# Patient Record
Sex: Female | Born: 1935 | Race: White | Hispanic: No | Smoking: Former smoker
Health system: Southern US, Community
[De-identification: ages and names within clinical notes are randomized; demographics above are authoritative.]

## PROBLEM LIST (undated history)

## (undated) DIAGNOSIS — R519 Headache, unspecified: Secondary | ICD-10-CM

## (undated) DIAGNOSIS — C801 Malignant (primary) neoplasm, unspecified: Secondary | ICD-10-CM

## (undated) DIAGNOSIS — I639 Cerebral infarction, unspecified: Secondary | ICD-10-CM

## (undated) DIAGNOSIS — E854 Organ-limited amyloidosis: Secondary | ICD-10-CM

## (undated) DIAGNOSIS — Z1810 Retained metal fragments, unspecified: Secondary | ICD-10-CM

## (undated) DIAGNOSIS — I219 Acute myocardial infarction, unspecified: Secondary | ICD-10-CM

## (undated) DIAGNOSIS — K219 Gastro-esophageal reflux disease without esophagitis: Secondary | ICD-10-CM

## (undated) DIAGNOSIS — S060X9A Concussion with loss of consciousness of unspecified duration, initial encounter: Secondary | ICD-10-CM

## (undated) DIAGNOSIS — I2510 Atherosclerotic heart disease of native coronary artery without angina pectoris: Secondary | ICD-10-CM

## (undated) DIAGNOSIS — M1990 Unspecified osteoarthritis, unspecified site: Secondary | ICD-10-CM

## (undated) DIAGNOSIS — R51 Headache: Secondary | ICD-10-CM

## (undated) DIAGNOSIS — S8263XA Displaced fracture of lateral malleolus of unspecified fibula, initial encounter for closed fracture: Secondary | ICD-10-CM

## (undated) DIAGNOSIS — K449 Diaphragmatic hernia without obstruction or gangrene: Secondary | ICD-10-CM

## (undated) DIAGNOSIS — C4491 Basal cell carcinoma of skin, unspecified: Secondary | ICD-10-CM

## (undated) DIAGNOSIS — E7800 Pure hypercholesterolemia, unspecified: Secondary | ICD-10-CM

## (undated) DIAGNOSIS — I10 Essential (primary) hypertension: Secondary | ICD-10-CM

## (undated) DIAGNOSIS — I739 Peripheral vascular disease, unspecified: Secondary | ICD-10-CM

## (undated) DIAGNOSIS — M359 Systemic involvement of connective tissue, unspecified: Secondary | ICD-10-CM

## (undated) DIAGNOSIS — F419 Anxiety disorder, unspecified: Secondary | ICD-10-CM

## (undated) DIAGNOSIS — N12 Tubulo-interstitial nephritis, not specified as acute or chronic: Secondary | ICD-10-CM

## (undated) HISTORY — DX: Basal cell carcinoma of skin, unspecified: C44.91

## (undated) HISTORY — DX: Atherosclerotic heart disease of native coronary artery without angina pectoris: I25.10

## (undated) HISTORY — PX: COLONOSCOPY: SHX174

## (undated) HISTORY — PX: OTHER SURGICAL HISTORY: SHX169

## (undated) HISTORY — DX: Essential (primary) hypertension: I10

## (undated) HISTORY — DX: Diaphragmatic hernia without obstruction or gangrene: K44.9

## (undated) HISTORY — DX: Pure hypercholesterolemia, unspecified: E78.00

---
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
## 1977-08-12 HISTORY — PX: ABDOMINAL HYSTERECTOMY: SHX81

## 1993-08-12 HISTORY — PX: JOINT REPLACEMENT: SHX530

## 2002-01-27 ENCOUNTER — Encounter: Payer: Self-pay | Admitting: Neurosurgery

## 2002-01-27 ENCOUNTER — Ambulatory Visit (HOSPITAL_COMMUNITY): Admission: RE | Admit: 2002-01-27 | Discharge: 2002-01-27 | Payer: Self-pay | Admitting: Neurosurgery

## 2003-08-13 DIAGNOSIS — I219 Acute myocardial infarction, unspecified: Secondary | ICD-10-CM

## 2003-08-13 HISTORY — DX: Acute myocardial infarction, unspecified: I21.9

## 2004-08-31 ENCOUNTER — Inpatient Hospital Stay: Payer: Self-pay | Admitting: Cardiology

## 2004-10-08 ENCOUNTER — Ambulatory Visit: Payer: Self-pay | Admitting: Internal Medicine

## 2006-05-06 ENCOUNTER — Ambulatory Visit (HOSPITAL_COMMUNITY): Admission: RE | Admit: 2006-05-06 | Discharge: 2006-05-07 | Payer: Self-pay | Admitting: *Deleted

## 2008-04-27 ENCOUNTER — Ambulatory Visit: Payer: Self-pay | Admitting: Obstetrics and Gynecology

## 2008-05-19 ENCOUNTER — Ambulatory Visit: Payer: Self-pay | Admitting: Obstetrics and Gynecology

## 2008-05-27 ENCOUNTER — Inpatient Hospital Stay: Payer: Self-pay | Admitting: Obstetrics and Gynecology

## 2008-08-12 HISTORY — PX: EYE SURGERY: SHX253

## 2012-08-12 HISTORY — PX: CAROTID STENT: SHX1301

## 2012-09-28 LAB — CBC AND DIFFERENTIAL: WBC: 6.1 10^3/mL

## 2012-09-30 ENCOUNTER — Ambulatory Visit: Payer: Self-pay | Admitting: Internal Medicine

## 2012-10-01 LAB — BASIC METABOLIC PANEL
Calcium, Total: 8.8 mg/dL (ref 8.5–10.1)
Chloride: 109 mmol/L — ABNORMAL HIGH (ref 98–107)
Co2: 23 mmol/L (ref 21–32)
Potassium: 4.3 mmol/L (ref 3.5–5.1)

## 2012-12-12 ENCOUNTER — Encounter: Payer: Self-pay | Admitting: Internal Medicine

## 2012-12-14 ENCOUNTER — Ambulatory Visit: Payer: Self-pay | Admitting: Internal Medicine

## 2013-01-21 ENCOUNTER — Encounter: Payer: Self-pay | Admitting: Internal Medicine

## 2014-07-05 DIAGNOSIS — I471 Supraventricular tachycardia: Secondary | ICD-10-CM | POA: Insufficient documentation

## 2014-08-12 DIAGNOSIS — I639 Cerebral infarction, unspecified: Secondary | ICD-10-CM

## 2014-08-12 HISTORY — DX: Cerebral infarction, unspecified: I63.9

## 2014-08-24 ENCOUNTER — Ambulatory Visit (INDEPENDENT_AMBULATORY_CARE_PROVIDER_SITE_OTHER): Payer: Medicare Other | Admitting: Podiatry

## 2014-08-24 ENCOUNTER — Encounter: Payer: Self-pay | Admitting: Podiatry

## 2014-08-24 VITALS — BP 135/63 | HR 76 | Resp 16 | Ht 65.0 in | Wt 157.0 lb

## 2014-08-24 DIAGNOSIS — M7741 Metatarsalgia, right foot: Secondary | ICD-10-CM
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
## 2014-10-03 ENCOUNTER — Ambulatory Visit (INDEPENDENT_AMBULATORY_CARE_PROVIDER_SITE_OTHER): Payer: Medicare Other | Admitting: *Deleted

## 2014-10-03 DIAGNOSIS — M7741 Metatarsalgia, right foot: Secondary | ICD-10-CM

## 2014-10-03 NOTE — Progress Notes (Signed)
Orthotics dispensed. Instructions given on break in.

## 2014-10-03 NOTE — Patient Instructions (Signed)

## 2014-10-21 DIAGNOSIS — I6523 Occlusion and stenosis of bilateral carotid arteries: Secondary | ICD-10-CM | POA: Insufficient documentation

## 2014-10-21 DIAGNOSIS — J3089 Other allergic rhinitis: Secondary | ICD-10-CM | POA: Insufficient documentation

## 2014-10-31 ENCOUNTER — Ambulatory Visit: Payer: Medicare Other | Admitting: Podiatry

## 2014-12-02 NOTE — Discharge Summary (Signed)
PATIENT NAME:  Kelly Ritter, Kelly Ritter MR#:  [id number] DATE OF BIRTH:  12-28-1933  DATE OF ADMISSION:  09/30/2012 DATE OF DISCHARGE:  10/01/2012  HISTORY: This is a 81 year old female with coronary artery disease, hypertension, hyperlipidemia and chest pain.  DISCHARGE DIAGNOSES: 1.  Coronary artery disease.  2.  Hypertension.  3.  Hyperlipidemia.  The patient has had progressive episodes of chest discomfort, shortness of breath and palpitations consistent with her anginal equivalent. She underwent cardiac catheterization showing a patent left circumflex and left anterior descending artery stent with a restenosis of her right coronary artery stent. She underwent drug-eluting stent implant without evidence of significant complications. She had reached her maximal hospital benefit and was discharged to home. The patient has had no further symptoms.  DISCHARGE MEDICATIONS: Aspirin 325 mg p.o. daily, Plavix 75 mg p.o. daily, Bystolic 5 mg each day, Lipitor 10 mg each day and lisinopril 10 mg each night.   FOLLOWUP: She is to follow up in two weeks for further adjustments of medications.    ____________________________ Corey Skains, MD bjk:aw D: 10/01/2012 08:07:15 ET T: 10/01/2012 08:34:15 ET JOB#: [id number]  cc: Corey Skains, MD, <Dictator> Corey Skains MD ELECTRONICALLY SIGNED 10/09/2012 8:54

## 2014-12-12 ENCOUNTER — Inpatient Hospital Stay
Admission: EM | Admit: 2014-12-12 | Discharge: 2014-12-13 | DRG: 069 | Disposition: A | Discharge status: Home / Self Care | Payer: Medicare Other | Attending: Internal Medicine | Admitting: Internal Medicine

## 2014-12-12 ENCOUNTER — Emergency Department: Payer: Medicare Other

## 2014-12-12 ENCOUNTER — Inpatient Hospital Stay: Payer: Medicare Other

## 2014-12-12 ENCOUNTER — Encounter: Payer: Self-pay | Admitting: Emergency Medicine

## 2014-12-12 DIAGNOSIS — Z7982 Long term (current) use of aspirin: Secondary | ICD-10-CM | POA: Diagnosis not present

## 2014-12-12 DIAGNOSIS — Z7902 Long term (current) use of antithrombotics/antiplatelets: Secondary | ICD-10-CM

## 2014-12-12 DIAGNOSIS — H532 Diplopia: Secondary | ICD-10-CM | POA: Diagnosis present

## 2014-12-12 DIAGNOSIS — I6523 Occlusion and stenosis of bilateral carotid arteries: Secondary | ICD-10-CM | POA: Diagnosis present

## 2014-12-12 DIAGNOSIS — E780 Pure hypercholesterolemia: Secondary | ICD-10-CM | POA: Diagnosis present

## 2014-12-12 DIAGNOSIS — I10 Essential (primary) hypertension: Secondary | ICD-10-CM | POA: Diagnosis present

## 2014-12-12 DIAGNOSIS — Z955 Presence of coronary angioplasty implant and graft: Secondary | ICD-10-CM

## 2014-12-12 DIAGNOSIS — I639 Cerebral infarction, unspecified: Secondary | ICD-10-CM | POA: Diagnosis present

## 2014-12-12 DIAGNOSIS — R4701 Aphasia: Secondary | ICD-10-CM | POA: Diagnosis present

## 2014-12-12 DIAGNOSIS — G459 Transient cerebral ischemic attack, unspecified: Secondary | ICD-10-CM

## 2014-12-12 DIAGNOSIS — Z8249 Family history of ischemic heart disease and other diseases of the circulatory system: Secondary | ICD-10-CM | POA: Diagnosis not present

## 2014-12-12 DIAGNOSIS — I2510 Atherosclerotic heart disease of native coronary artery without angina pectoris: Secondary | ICD-10-CM | POA: Diagnosis present

## 2014-12-12 DIAGNOSIS — I6529 Occlusion and stenosis of unspecified carotid artery: Secondary | ICD-10-CM

## 2014-12-12 DIAGNOSIS — E785 Hyperlipidemia, unspecified: Secondary | ICD-10-CM | POA: Diagnosis present

## 2014-12-12 DIAGNOSIS — R29810 Facial weakness: Secondary | ICD-10-CM | POA: Diagnosis present

## 2014-12-12 DIAGNOSIS — Z87891 Personal history of nicotine dependence: Secondary | ICD-10-CM | POA: Diagnosis not present

## 2014-12-12 DIAGNOSIS — Z96659 Presence of unspecified artificial knee joint: Secondary | ICD-10-CM | POA: Diagnosis present

## 2014-12-12 DIAGNOSIS — Z79899 Other long term (current) drug therapy: Secondary | ICD-10-CM

## 2014-12-12 HISTORY — DX: Systemic involvement of connective tissue, unspecified: M35.9

## 2014-12-12 LAB — URINALYSIS COMPLETE WITH MICROSCOPIC (ARMC ONLY)
Bacteria, UA: NONE SEEN
Bilirubin Urine: NEGATIVE
Glucose, UA: NEGATIVE mg/dL
Ketones, ur: NEGATIVE mg/dL
LEUKOCYTES UA: NEGATIVE
Nitrite: NEGATIVE
PH: 5 (ref 5.0–8.0)
PROTEIN: NEGATIVE mg/dL
Specific Gravity, Urine: 1.006 (ref 1.005–1.030)

## 2014-12-12 LAB — CBC WITH DIFFERENTIAL/PLATELET
Basophils Absolute: 0.1 10*3/uL (ref 0–0.1)
Basophils Relative: 1 %
Eosinophils Absolute: 0.1 10*3/uL (ref 0–0.7)
Eosinophils Relative: 2 %
HCT: 43.1 % (ref 35.0–47.0)
HEMOGLOBIN: 14.2 g/dL (ref 12.0–16.0)
Lymphocytes Relative: 22 %
Lymphs Abs: 1.2 10*3/uL (ref 1.0–3.6)
MCH: 31.1 pg (ref 26.0–34.0)
MCHC: 32.9 g/dL (ref 32.0–36.0)
MCV: 94.7 fL (ref 80.0–100.0)
MONO ABS: 0.4 10*3/uL (ref 0.2–0.9)
Neutro Abs: 3.6 10*3/uL (ref 1.4–6.5)
Neutrophils Relative %: 68 %
PLATELETS: 177 10*3/uL (ref 150–440)
RBC: 4.55 MIL/uL (ref 3.80–5.20)
RDW: 12.8 % (ref 11.5–14.5)
WBC: 5.3 10*3/uL (ref 3.6–11.0)

## 2014-12-12 LAB — COMPREHENSIVE METABOLIC PANEL
ALT: 17 U/L (ref 14–54)
AST: 20 U/L (ref 15–41)
Albumin: 4.5 g/dL (ref 3.5–5.0)
Alkaline Phosphatase: 87 U/L (ref 38–126)
Anion gap: 9 (ref 5–15)
BUN: 17 mg/dL (ref 6–20)
CHLORIDE: 106 mmol/L (ref 101–111)
CO2: 27 mmol/L (ref 22–32)
Calcium: 9.3 mg/dL (ref 8.9–10.3)
Creatinine, Ser: 0.77 mg/dL (ref 0.44–1.00)
GFR calc non Af Amer: >60 mL/min (ref >60)
Glucose, Bld: 111 mg/dL — ABNORMAL HIGH (ref 65–99)
Potassium: 4 mmol/L (ref 3.5–5.1)
Sodium: 142 mmol/L (ref 135–145)
TOTAL PROTEIN: 7.5 g/dL (ref 6.5–8.1)
Total Bilirubin: 0.8 mg/dL (ref 0.3–1.2)

## 2014-12-12 LAB — URINE DRUG SCREEN, QUALITATIVE (ARMC ONLY)
AMPHETAMINES, UR SCREEN: NOT DETECTED
BENZODIAZEPINE, UR SCRN: NOT DETECTED
Barbiturates, Ur Screen: NOT DETECTED
COCAINE METABOLITE, UR ~~LOC~~: NOT DETECTED
Cannabinoid 50 Ng, Ur ~~LOC~~: NOT DETECTED
MDMA (Ecstasy)Ur Screen: NOT DETECTED
Methadone Scn, Ur: NOT DETECTED
OPIATE, UR SCREEN: NOT DETECTED
Phencyclidine (PCP) Ur S: NOT DETECTED
TRICYCLIC, UR SCREEN: NOT DETECTED

## 2014-12-12 LAB — APTT: APTT: 29 s (ref 24–36)

## 2014-12-12 LAB — PROTIME-INR
INR: 0.99
Prothrombin Time: 13.3 seconds (ref 11.4–15.0)

## 2014-12-12 LAB — TROPONIN I: Troponin I: <0.03 ng/mL (ref <0.031)

## 2014-12-12 MED ORDER — ROSUVASTATIN CALCIUM 20 MG PO TABS
20.0000 mg | ORAL_TABLET | Freq: Every day | ORAL | Status: DC
  Administered 2014-12-12: 20 mg via ORAL
  Filled 2014-12-12: qty 1

## 2014-12-12 MED ORDER — ASPIRIN 81 MG PO TABS
81.0000 mg | ORAL_TABLET | Freq: Every day | ORAL | Status: DC
  Administered 2014-12-12: 81 mg via ORAL
  Filled 2014-12-12 (×3): qty 1

## 2014-12-12 MED ORDER — ACETAMINOPHEN 650 MG RE SUPP: 650.0000 mg | Freq: Four times a day (QID) | RECTAL | Status: DC | PRN

## 2014-12-12 MED ORDER — ACETAMINOPHEN 325 MG PO TABS
650.0000 mg | ORAL_TABLET | Freq: Four times a day (QID) | ORAL | Status: DC | PRN
  Administered 2014-12-12: 650 mg via ORAL
  Filled 2014-12-12: qty 2

## 2014-12-12 MED ORDER — ASPIRIN 81 MG PO CHEW
CHEWABLE_TABLET | ORAL | Status: AC
  Administered 2014-12-12: 324 mg via ORAL
  Filled 2014-12-12: qty 4

## 2014-12-12 MED ORDER — SENNOSIDES-DOCUSATE SODIUM 8.6-50 MG PO TABS: 1.0000 | ORAL_TABLET | Freq: Every evening | ORAL | Status: DC | PRN

## 2014-12-12 MED ORDER — ENOXAPARIN SODIUM 40 MG/0.4ML ~~LOC~~ SOLN
SUBCUTANEOUS | Status: AC
  Administered 2014-12-12: 40 mg via SUBCUTANEOUS
  Filled 2014-12-12: qty 0.4

## 2014-12-12 MED ORDER — ENOXAPARIN SODIUM 40 MG/0.4ML ~~LOC~~ SOLN
40.0000 mg | SUBCUTANEOUS | Status: DC
  Administered 2014-12-12 – 2014-12-13 (×2): 40 mg via SUBCUTANEOUS
  Filled 2014-12-12: qty 0.4

## 2014-12-12 MED ORDER — ASPIRIN 81 MG PO CHEW
324.0000 mg | CHEWABLE_TABLET | Freq: Once | ORAL | Status: AC
  Administered 2014-12-12: 324 mg via ORAL

## 2014-12-12 MED ORDER — CLOPIDOGREL BISULFATE 75 MG PO TABS
75.0000 mg | ORAL_TABLET | Freq: Every day | ORAL | Status: DC
  Administered 2014-12-12: 75 mg via ORAL
  Filled 2014-12-12: qty 1

## 2014-12-12 MED ORDER — IOHEXOL 350 MG/ML SOLN
100.0000 mL | Freq: Once | INTRAVENOUS | Status: AC | PRN
  Administered 2014-12-12: 100 mL via INTRAVENOUS

## 2014-12-12 MED ORDER — ASPIRIN EC 81 MG PO TBEC
81.0000 mg | DELAYED_RELEASE_TABLET | Freq: Every day | ORAL | Status: DC
  Administered 2014-12-13: 81 mg via ORAL
  Filled 2014-12-12: qty 1

## 2014-12-12 NOTE — H&P (Addendum)
Patient Demographics  Kelly Ritter, is a 81 y.o. female  MRN: [id number]   DOB - 01-17-1934  Admit Date - 12/12/2014  Outpatient Primary MD for the patient is Singh,Jasmine, MD   With History of -  Past Medical History  Diagnosis Date  . Hypertension   . Hiatal hernia   . CAD (coronary artery disease)     s/p PCI and stent placement of circumflex and LAD and RCA.  restenosis of RCA 2014 with drug eluting stent  . Hypercholesterolemia       Past Surgical History  Procedure Laterality Date  . Joint replacement      knee replacement    in for   Chief Complaint  Patient presents with  . Aphasia  . Diplopia     HPI  Kelly Ritter  is a 81 y.o. female with past medical history as stated above who presents this morning to the emergency department with complaints of blurred vision. Patient says she's had double vision and blurred vision since this morning around 6:30 she woke up with a slight headache and slurred speech and noted her left side vision to be double. She says she seeds optic side to side. Her symptoms resolved by the time she got to the ER at 8:30 AM this morning. She received an aspirin in the emergency department and also services consult to to admit the patient. Patient underwent a carotid Doppler approximately 2 weeks ago. The Doppler had 70% stenosis in the left carotid artery. She saw Dr. do for this lesion. She says at this time Dr. do did not want to do any intervention.    Review of Systems     Constitutional: Negative for fever, chills and malaise/fatigue.  HENT: Negative for ear pain, nosebleeds and sore throat.   Eyes: Negative for blurred vision, double vision and pain.  Respiratory: Negative for cough, hemoptysis, shortness of breath and wheezing.   Cardiovascular: Negative for  chest pain, palpitations, orthopnea and PND.  Gastrointestinal: Negative for nausea, vomiting, abdominal pain, diarrhea, constipation and blood in stool.  Genitourinary: Negative for dysuria, urgency and hematuria.  Musculoskeletal: Negative for myalgias and back pain.  Skin: Negative for rash.  Neurological: Negative for dizziness, tremors, speech change, focal weakness, seizures and headaches.  Endo/Heme/Allergies: Negative for polydipsia. Does not bruise/bleed easily.  Psychiatric/Behavioral: Negative for depression and hallucinations. The patient is not nervous/anxious.    Social History History  Substance Use Topics  . Smoking status: Former Smoker -- 1.00 packs/day for 8 years    Quit date: 08/12/1989  . Smokeless tobacco: Never Used  . Alcohol Use: 0.0 oz/week    0 Standard drinks or equivalent per week     Comment: rarely drinks alcohol        Family History Family History  Problem Relation Age of Onset  . Heart disease Mother   . Hypertension Brother   . Heart disease Brother 104  several MIs  . Heart disease Daughter 84    deceased from MI     Prior to Admission medications   Medication Sig Start Date End Date Taking? Authorizing Provider  acetaminophen (TYLENOL) 650 MG CR tablet Take 650 mg by mouth at bedtime as needed for pain.    Yes Historical Provider, MD  aspirin 81 MG tablet Take 81 mg by mouth daily.   Yes Historical Provider, MD  clopidogrel (PLAVIX) 75 MG tablet Take 75 mg by mouth daily.   Yes Historical Provider, MD  lisinopril (PRINIVIL,ZESTRIL) 10 MG tablet Take 10 mg by mouth daily.   Yes Historical Provider, MD  nebivolol (BYSTOLIC) 5 MG tablet Take 5 mg by mouth daily.   Yes Historical Provider, MD  potassium gluconate 595 MG TABS tablet Take 595 mg by mouth at bedtime as needed (leg cramps).   Yes Historical Provider, MD  rosuvastatin (CRESTOR) 20 MG tablet Take 20 mg by mouth at bedtime.   Yes Historical Provider, MD  rosuvastatin (CRESTOR)  10 MG tablet Take 10 mg by mouth daily.    Historical Provider, MD    Allergies  Allergen Reactions  . Sulfa Antibiotics   . Latex Rash    Physical Exam  Vitals  Blood pressure 137/54, pulse 59, temperature 97.5 F (36.4 C), temperature source Oral, resp. rate 14, height 5\' 4"  (1.626 m), weight 72.122 kg (159 lb), SpO2 100 %.   Constitutional:  Well-developed and well-nourished. No distress.  HENT:  Head: Normocephalic and atraumatic.  Mouth/Throat: Oropharynx is clear and moist.  Eyes: Pupils are equal, round, and reactive to light.  Neck: Normal range of motion. Neck supple. No JVD present. No tracheal deviation present. No thyromegaly present.  Cardiovascular: Normal rate, regular rhythm and normal heart sounds.  Exam reveals no gallop.   No murmur heard. Respiratory: Effort normal and breath sounds normal. No respiratory distress. No wheezing, crackles, rales. GI: Soft. No distension. There is no tenderness. There is no rebound and no guarding.  Musculoskeletal:No edema or tenderness.  Neurological: Alert and oriented to person, place, and time. She has mild left facial droop. She also has decreased sensation of left face. Coordination normal.  Skin: Skin is warm and dry. No rash noted.  Psychiatric: He has a normal mood and affect.     Data Review  CBC  Recent Labs Lab 12/12/14 0925  WBC 5.3  HGB 14.2  HCT 43.1  PLT 177  MCV 94.7  MCH 31.1  MCHC 32.9  RDW 12.8  LYMPHSABS 1.2  MONOABS 0.4  EOSABS 0.1  BASOSABS 0.1   ------------------------------------------------------------------------------------------------------------------  Chemistries   Recent Labs Lab 12/12/14 0925  NA 142  K 4.0  CL 106  CO2 27  GLUCOSE 111*  BUN 17  CREATININE 0.77  CALCIUM 9.3  AST 20  ALT 17  ALKPHOS 87  BILITOT 0.8   ------------------------------------------------------------------------------------------------------------------ estimated creatinine  clearance is 56.5 mL/min (by C-G formula based on Cr of 0.77). ------------------------------------------------------------------------------------------------------------------ No results for input(s): TSH, T4TOTAL, T3FREE, THYROIDAB in the last 72 hours.  Invalid input(s): FREET3   Coagulation profile  Recent Labs Lab 12/12/14 0925  INR 0.99   ------------------------------------------------------------------------------------------------------------------- No results for input(s): DDIMER in the last 72 hours. -------------------------------------------------------------------------------------------------------------------  Cardiac Enzymes  Recent Labs Lab 12/12/14 0925  TROPONINI <0.03   ------------------------------------------------------------------------------------------------------------------ Invalid input(s): POCBNP   ---------------------------------------------------------------------------------------------------------------  Urinalysis    Component Value Date/Time   COLORURINE STRAW* 12/12/2014 0950   APPEARANCEUR CLEAR* 12/12/2014 0950   LABSPEC 1.006  12/12/2014 0950   PHURINE 5.0 12/12/2014 0950   GLUCOSEU NEGATIVE 12/12/2014 0950   HGBUR 1+* 12/12/2014 0950   BILIRUBINUR NEGATIVE 12/12/2014 0950   KETONESUR NEGATIVE 12/12/2014 0950   PROTEINUR NEGATIVE 12/12/2014 0950   NITRITE NEGATIVE 12/12/2014 0950   LEUKOCYTESUR NEGATIVE 12/12/2014 0950    ----------------------------------------------------------------------------------------------------------------  Imaging results:   @[email or web address] @  My personal review of EKG: NSR,  no Acute ST changes    Assessment & Plan  1. CVA: Patient presents with diplopia of the left eye and slurred speech very concerning for a stroke. She also has a left facial droop. She had recent carotid Doppler showing 70% lesion and left carotid artery. She is currently on aspirin and Plavix which we will continue. She is  also on a statin which I will continue. I have asked Dr. do to see the patient in consultation patient will likely at this time need a CEA. We will hold hypertensive medications to allow perfusion. She is unable to obtain an MRI as she has metal in her eyes. We'll obtain CT of the head tomorrow. I've also ordered a 2-D echocardiogram with bubble study to evaluate for an embolic stroke. We'll continue neuro checks every 4 hours. She does not need physical therapy speech or occupational therapy at this time. 2. History of CAD status post 7 stents patient is seen by Dr. Jose Persia. We'll continue with her outpatient medications we are holding hypertensive medications at this time to allow brain perfusion due to problem #1. 3. Essential hypertension: We are holding medications as mentioned above. 4. Hyperlipidemia: We will check fasting lipids. Into any statin for now.   DVT Prophylaxis Heparin -  Lovenox - SCDs     Family Communication: Admission, patients condition and plan of care including tests being ordered have been discussed with the patient and family who indicate understanding and agree with the plan and Code Status.  Code Status full   Time spent in minutes : 35 minutes   Arlander Gillen, MD

## 2014-12-12 NOTE — Consult Note (Signed)
Kelly Ritter, Kelly Ritter               ACCOUNT NO.:  [id number]  MEDICAL RECORD NO.:  [id number]  LOCATION:  237A                         FACILITY:  [facility]  PHYSICIAN:  Algernon Huxley, MD        DATE OF BIRTH:  [birth year]  DATE OF CONSULTATION:  12/12/2014 DATE OF DISCHARGE:                                CONSULTATION   REASON FOR CONSULTATION:  Slurred speech, visual symptoms, and facial droop in a patient with known carotid artery stenosis.  HPI:  This is a 81 year old female who presented with a history of acute double and blurred vision in the left eye.  It started this morning. She had no previous symptoms or problems prior to this episode.  This started around 6:30 or 7 this morning.  Her symptoms had basically resolved by the time she got to the ER at 8:30.  She is on aspirin and Plavix chronically for known moderate left carotid artery stenosis.  She was last seen for this about a month ago.  At that time she was having no focal neurologic symptoms.  Associated with her visual symptoms was some possible facial droop, as well as some difficulty with speech that lasted only a few minutes.  She reports no trauma or injury.  She reports no fever or chills.  She has no previous history of stroke or TIA.  She does have a strong cardiac history.  REVIEW OF SYSTEMS:  GENERAL:  No fever or chills.  No unintentional weight loss or gain.  EYES:  As per HPI.  Positive for double and blurry vision.  EARS:  No ear pain or tinnitus.  CARDIAC:  History of significant coronary disease, and 7 previous coronary interventions, but no recent chest pain or palpitations.  RESPIRATORY:  No shortness of breath, cough, or wheeze.  GI:  No nausea, vomiting, diarrhea, or constipation.  GU:  No dysuria or hematuria.  MUSCULOSKELETAL:  No joint pain or swelling.  SKIN:  No new rashes or ulcers.  NEURO:  As per HPI. No history of seizures or tremors.  ENDOCRINE:  No heat or cold intolerance.  No polydipsia or  polyuria.  PSYCH:  No anxiety or depression.  No suicidal ideation.  HEME:  No anemia or easy bruising.  SOCIAL HISTORY:  Former tobacco use, but quit over 20 years ago.  No alcohol use.  Married and lives at home with her husband.  FAMILY HISTORY:  Has multiple members with myocardial infarction, including a daughter, brother, and mother.  Brother also has severe hypertension.  HOME MEDICATIONS: 1. Tylenol as needed for pain. 2. Aspirin 81 mg daily. 3. Plavix 75 mg daily. 4. Lisinopril 10 mg daily. 5. Bystolic 5 mg daily. 6. Potassium gluconate 595 mcg daily. 7. Crestor 20 mg daily.  ALLERGIES: 1. SULFA ANTIBIOTICS. 2. LATEX.  PHYSICAL EXAMINATION:  VITAL SIGNS:  She is afebrile, with a T max of 97.7.  Pulse is 59, respirations are 20, blood pressure is 142/61, saturation is 100% on room air. GENERAL:  She is a well-developed, well-nourished white female who appears slightly younger than her stated age and not in apparent distress. EYES:  Pupils are equal, round, and  reactive to light.  Sclerae are not icteric. HEAD:  Normocephalic and atraumatic. NECK:  Supple, without adenopathy or JVD.  A left carotid bruit was present. CARDIAC:  Regular rate and rhythm, without murmurs, rubs, or gallops. RESPIRATORY:  Good respiratory effort, without use of accessory muscles. Respirations are unlabored. GI:  Soft, nondistended, nontender. MUSCULOSKELETAL:  No cyanosis, clubbing, or edema. PSYCH:  Normal affect and mood.  Awake, alert, and oriented. NEURO:  Normal strength and tone in all 4 extremities.  No obvious facial droop or cranial nerve deficits at this time. SKIN:  Warm and dry, without rashes or ulcers.  STUDIES:  EKG showed normal sinus rhythm, with no ST changes. Laboratory evaluation revealed a white blood cell count of 5.3, hemoglobin 14.2, platelet count 177,000.  Sodium is 142, potassium 4.0, chloride 106, CO2 27, BUN 17, creatinine 0.77.  Troponin is  negative. Glucose 111.  CT of the head showed no acute intracranial issues.  ASSESSMENT/PLAN: 1. Transient ischemic attack. 2. Known mild right carotid stenosis and previous moderate left     carotid artery stenosis.  This is possibly causing number 1. 3. Coronary artery disease, with multiple previous coronary     interventions.  No current myocardial infarction or ischemia     appears present.  Discussed that cardiac issues can be causes of     transient ischemic attack or stroke as well. 4. Essential hypertension.  Primary service is holding medication.     Stable currently. 5. Hyperlipidemia, on statin.  Fasting lipid panel pending.  PLAN:  Given her known at least moderate left carotid artery stenosis and recent symptoms worrisome for ipsilateral embolization, I have recommended a CT angiogram for a more thorough evaluation.  This will allow US imaging to determine if her degree of stenosis is, indeed, greater than 60% to 70%.  If it is, consideration for treatment will be given, based on the likely symptomatic status from her recent TIA.  I have discussed the 2 modes of treatment, including carotid endarterectomy and carotid artery stenting.  She is on appropriate medical management with aspirin, Plavix, and a statin agent.  We will plan to get her CT angiogram done tonight, and I will review this and discuss with her the options.  If she does need a carotid endarterectomy, her Plavix will have to be stopped at least 3-5 days prior to surgery, and she should likely have a cardiac evaluation to assess her for perioperative risk.  If she is deemed prohibitive perioperative risk, with her extensive cardiac history, carotid artery stenting may be a possibility, but we will need to assess this with CT scan to evaluate her anatomy.  I have had a long discussion with her and her daughter-in-law today regarding her options, and she is agreeable with our plan of care.  This is a level 4  consultation.         ______________________________ Algernon Huxley, MD    JSD/MEDQ  D:  12/12/2014  T:  12/12/2014  Job:  [id number]

## 2014-12-12 NOTE — Progress Notes (Signed)
   12/12/14 1425  Clinical Encounter Type  Visited With Patient  Visit Type Spiritual support  Consult/Referral To Chaplain  Recommendations Spoke with nurse consult was included in error for AD.  Spiritual Encounters  Spiritual Needs Prayer  Stress Factors  Patient Stress Factors Health changes  Family Stress Factors None identified  Advance Directives (For Healthcare)  Does patient have an advance directive? No  Would patient like information on creating an advanced directive? No - patient declined information  Nurse requested AD information in error. Patient asked me to keep her in prayer as she is having an upcoming procedure.  Chaplain Candance Bohlman A. Dustyn Armbrister [phone]

## 2014-12-12 NOTE — Consult Note (Signed)
See dictated note. Unable to put standard consult note in computer CTA ordered

## 2014-12-12 NOTE — ED Provider Notes (Signed)
[facility] Emergency Department Provider Note    ____________________________________________  Time seen: 9 AM  I have reviewed the triage vital signs and the nursing notes.   HISTORY  Chief Complaint Aphasia and Diplopia     HPI Kelly Ritter is a 81 y.o. female who presents with slurred speech and double vision. Symptoms improved prior to arrival. Patient reports that she awoke this morning feeling funny and foggy. After she went downstairs she noticed that she had double vision when looking at her husband. Her husband reports that her speech seemed slightly slurred. Patient denies headache. She has no history of CVA. 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 Saw Dr. Lucky Cowboy for this and medication management was recommended. No fevers chills. No nausea vomiting. No motor weakness or numbness.     Past Medical History  Diagnosis Date  . Hypertension   . Hiatal hernia   . CAD (coronary artery disease)     s/p PCI and stent placement of circumflex and LAD and RCA.  restenosis of RCA 2014 with drug eluting stent  . Hypercholesterolemia     There are no active problems to display for this patient.   Past Surgical History  Procedure Laterality Date  . Joint replacement      knee replacement    Current Outpatient Rx  Name  Route  Sig  Dispense  Refill  . acetaminophen (TYLENOL) 650 MG CR tablet   Oral   Take 650 mg by mouth 2 (two) times daily as needed for pain.         Marland Kitchen aspirin 81 MG tablet   Oral   Take 81 mg by mouth daily.         . clopidogrel (PLAVIX) 75 MG tablet   Oral   Take 75 mg by mouth daily.         Marland Kitchen lisinopril (PRINIVIL,ZESTRIL) 10 MG tablet   Oral   Take 10 mg by mouth daily.         . nebivolol (BYSTOLIC) 5 MG tablet   Oral   Take 5 mg by mouth daily.         . rosuvastatin (CRESTOR) 10 MG tablet   Oral   Take 10 mg by mouth daily.           Allergies Sulfa  antibiotics and Latex  Family History  Problem Relation Age of Onset  . Heart disease Mother   . Hypertension Brother   . Heart disease Brother 66    several MIs  . Heart disease Daughter 22    deceased from MI    Social History History  Substance Use Topics  . Smoking status: Former Smoker -- 1.00 packs/day for 8 years    Quit date: 08/12/1989  . Smokeless tobacco: Never Used  . Alcohol Use: 0.0 oz/week    0 Standard drinks or equivalent per week     Comment: rarely drinks alcohol    Review of Systems  Constitutional: Negative for fever. Eyes: Positive diplopia ENT: Negative for sore throat. Cardiovascular: Negative for chest pain. Respiratory: Negative for shortness of breath. Gastrointestinal: Negative for abdominal pain, vomiting and diarrhea. Genitourinary: Negative for dysuria. Musculoskeletal: Negative for back pain. Skin: Negative for rash. Neurological: Negative for headaches, focal weakness or numbness. Possible slurred speech Psychiatric:No anxiety or depression   10-point ROS otherwise negative.  ____________________________________________   PHYSICAL EXAM:  VITAL SIGNS: ED Triage Vitals  Enc Vitals  Group     BP 12/12/14 0836 137/54 mmHg     Pulse Rate 12/12/14 0836 59     Resp 12/12/14 0836 14     Temp 12/12/14 0836 97.7 F (36.5 C)     Temp Source 12/12/14 0836 Oral     SpO2 12/12/14 0836 100 %     Weight 12/12/14 0836 159 lb (72.122 kg)     Height 12/12/14 0836 5\' 4"  (1.626 m)     Head Cir --      Peak Flow --      Pain Score 12/12/14 0837 0     Pain Loc --      Pain Edu? --      Excl. in [place]? --      Constitutional: Alert and oriented. Well appearing and in no distress. Pleasant Eyes: Conjunctivae are normal. PERRL. extraocular movements appear normal however patient complains of diplopia when looking to the left. ENT   Head: Normocephalic and atraumatic.   Nose: No congestion/rhinnorhea.   Mouth/Throat: Mucous membranes  are moist.   Neck: No stridor. Hematological/Lymphatic/Immunilogical: No cervical lymphadenopathy. Cardiovascular: Normal rate, regular rhythm. Normal and symmetric distal pulses are present in all extremities. No murmurs, rubs, or gallops. Respiratory: Normal respiratory effort without tachypnea nor retractions. Breath sounds are clear and equal bilaterally. No wheezes/rales/rhonchi. Gastrointestinal: Soft and nontender. No distention. No abdominal bruits. There is no CVA tenderness. Genitourinary: Deferred Musculoskeletal: Nontender with normal range of motion in all extremities. No joint effusions.  No lower extremity tenderness nor edema. Neurologic:  Normal speech and language. No slurring on my exam No gross focal neurologic deficits are appreciated. Speech is normal. No gait instability. Skin:  Skin is warm, dry and intact. No rash noted. Psychiatric: Mood and affect are normal. Speech and behavior are normal. Patient exhibits appropriate insight and judgment.  ____________________________________________   EKG   Date: 12/12/2014  Rate: 61  Rhythm: normal sinus rhythm  QRS Axis: normal  Intervals: normal  ST/T Wave abnormalities: normal  Conduction Disutrbances: none  Narrative Interpretation: unremarkable      ____________________________________________    RADIOLOGY  CT head no acute distress  ____________________________________________   PROCEDURES  Procedure(s) performed: None  Critical Care performed: No  ____________________________________________   INITIAL IMPRESSION / ASSESSMENT AND PLAN / ED COURSE  Pertinent labs & imaging results that were available during my care of the patient were reviewed by me and considered in my medical decision making (see chart for details).  Accommodation of diplopia on left gaze and slurred speech with history of carotid artery blockage concerning for TIA versus CVA. Given that patient woke up with symptoms onset  is unknown. Hence patient is not a TPA candidate nor are her symptoms severe enough to warrant TPA. NIH stroke scale 1. We'll obtain CT and blood work for further information   ----------------------------------------- 11:16 AM on 12/12/2014 -----------------------------------------  CT negative patient feeling well however still with diplopia. Will admit for further evaluation and neurology consultation ____________________________________________   FINAL CLINICAL IMPRESSION(S) / ED DIAGNOSES  Final diagnoses:  Stroke     Lavonia Drafts, MD 12/12/14 1116

## 2014-12-12 NOTE — ED Notes (Signed)
Patient transported to CT 

## 2014-12-12 NOTE — ED Notes (Signed)
MD at bedside. 
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
## 2014-12-12 NOTE — ED Notes (Signed)
Slurred speech and double vision since 630am.  Went to [facility] and sent here.  No facial droop noted

## 2014-12-13 ENCOUNTER — Inpatient Hospital Stay: Payer: Medicare Other

## 2014-12-13 MED ORDER — NEBIVOLOL HCL 5 MG PO TABS
5.0000 mg | ORAL_TABLET | Freq: Every day | ORAL | Status: DC
  Administered 2014-12-13: 5 mg via ORAL
  Filled 2014-12-13: qty 1

## 2014-12-13 MED ORDER — LISINOPRIL 10 MG PO TABS
10.0000 mg | ORAL_TABLET | Freq: Every day | ORAL | Status: DC
  Administered 2014-12-13: 10 mg via ORAL
  Filled 2014-12-13: qty 1

## 2014-12-13 MED ORDER — ROSUVASTATIN CALCIUM 10 MG PO TABS
10.0000 mg | ORAL_TABLET | Freq: Every day | ORAL | Status: DC
  Administered 2014-12-13: 10 mg via ORAL
  Filled 2014-12-13: qty 1

## 2014-12-13 NOTE — Discharge Summary (Signed)
Physician Discharge Summary  Patient ID: Kelly Ritter MRN: [id number] DOB/AGE: 1934/06/13 81 y.o.  Admit date: 12/12/2014 Discharge date: 12/13/2014  Admission Diagnoses:CVA Discharge Diagnoses:  TIA left double vision  Discharged Condition: stable  Hospital Course:   1. TIA: Patient presented with left double vision. She has known 70% stenosis left carotid artery. Dr Lucky Cowboy was consulted as well as Cardiology for pre-operative clearance.She cannot have an MRI due to metal in her eye. She underwent a repeat CT scan which did not show CVA. Her symptoms have resolved. Her CTA neck also was consistent with a 70% lesion Left carotid artery.  Cardiology has seen the patient in consultation regarding her preoperative risk. Dr. Satira Mccallum felt that she would be fairly high risk for stopping dual antiplatelet therapy given her in-stent restenosis for surgery. If possible consideration for stenting of her carotid artery allowing her to remain on dual antiplatelet therapy would be ideal. 2. HTN essential: She may resume her outpatient medications.  3. Hyperlipidemia: She will continue with Crestor.  Consults: Vascular Cardiology  Significant Diagnostic Studies:  CTA: 1. 70% proximal left ICA stenosis. 2. 30% proximal right ICA stenosis. 3. Patent vertebral arteries without stenosis1. 70% proximal left ICA stenosis. 2. 30% proximal right ICA stenosis. 3. Patent vertebral arteries without stenosis  ECHO: Normal and preserved ejection fraction no regional wall motion abnormalities or significant valvular abnormalities.  Repeat head CT: No evidence of acute stroke.   Discharge Exam: Blood pressure 128/62, pulse 66, temperature 97.8 F (36.6 C), temperature source Oral, resp. rate 19, height 5' 4.5" (1.638 m), weight 73.211 kg (161 lb 6.4 oz), SpO2 96 %.   GENERAL: NAD  CVS regular rate no murmurs, gallops, rubs EXT no edema, clubbing LUNGS: clear no wheezing, crackles, rhonchii NEURO CN 2-12  intact no focal abnormalities  Disposition:   Discharge Instructions    Call MD for:  persistant dizziness or light-headedness    Complete by:  As directed      Diet - low sodium heart healthy    Complete by:  As directed      Discharge instructions    Complete by:  As directed   You will follow up with Dr. Lucky Cowboy in 2-3 days for evaluation of the left Carotid Artery. No heavy lifting or operating machinery until after follow up.     Increase activity slowly    Complete by:  As directed             Medication List    STOP taking these medications                TAKE these medications        acetaminophen 650 MG CR tablet  Commonly known as:  TYLENOL  Take 650 mg by mouth at bedtime as needed for pain.     aspirin 81 MG tablet  Take 81 mg by mouth daily.     lisinopril 10 MG tablet  Commonly known as:  PRINIVIL,ZESTRIL  Take 10 mg by mouth daily.     nebivolol 5 MG tablet  Commonly known as:  BYSTOLIC  Take 5 mg by mouth daily.     potassium gluconate 595 MG Tabs tablet  Take 595 mg by mouth at bedtime as needed (leg cramps).     rosuvastatin 20 MG tablet  Commonly known as:  CRESTOR  Take 20 mg by mouth at bedtime. Plavix 75 mg daily            Follow-up  Information    Follow up with DEW,JASON, MD In 2 days.   Specialty:  Vascular Surgery   Contact information:   [place] [id number] [phone]       Follow up with Singh,Jasmine, MD In 1 week.   Specialty:  Internal Medicine   Contact information:   [place] [address] [phone]      TIME SPENT 35 minutes  Signed: Merida Alcantar 12/13/2014, 10:57 AM

## 2014-12-13 NOTE — Progress Notes (Addendum)
Patient is alert and oriented this shift. Requested for Tylenol 650 mg oral  PRN for headache. On rececked, patient verbalized relief. Neuro checks continues with no change from previous assessments. Will continue to monitor.

## 2014-12-13 NOTE — Care Management (Signed)
Evaluated by Vascular.  CT angiogram reveals at least 70% stenosis of carotid.  Consideration for stenting or carotid endarterectomy

## 2014-12-13 NOTE — Consult Note (Signed)
Cardiology Consultation Note  Patient ID: Kelly Ritter, MRN: [id number], DOB/AGE: 19-Oct-1933 81 y.o. Admit date: 12/12/2014   Date of Consult: 12/13/2014 Primary Physician: Glendon Axe, MD Primary Cardiologist: Dr. Nehemiah Massed  Chief Complaint: Left sided cva Reason for Consult: preoperative evaluation  HPI: 81 y.o. female with h/o The Coronary artery disease status post PCI on multiple occasions with most recent restenoses of her right coronary artery stent in 2014. She  Underwent a redo PCI of her RCA stent.  She is now admitted with neurologic symptoms.  She has a history of blurred vision in her left eye with facial droop and difficulty speaking.  She has a known history of moderate left carotid artery stenosis.  She denies chest pain but states she had no chest pain prior to her stenting.   She also has stents in her left circumflex and LAD. She underwent an exercise stress echo per Dr. Nehemiah Massed in November, 2014 which was negative for ischemia.  She denies any chest pain.  She complains of a headache and visual abnormalities with improving symptoms since admission.  She was evaluated with a CTA which revealed heavily calcified lesion in her left internal carotid artery with a 30% stenosis in her right carotid artery.  She was seen by vascular surgery with consideration for either surgical or stent treatment of her lesion.  Patient is at moderately high a risk from a cardiac standpoint as she is had in stent restenoses in the past.  She is currently asymptomatic.  Electrocardiogram shows no significant ischemic changes.  She has been compliant with her medications including dual anti-platelet therapy with aspirin and clopidogrel.  Past Medical History  Diagnosis Date  . Hypertension   . Hiatal hernia   . CAD (coronary artery disease)     s/p PCI and stent placement of circumflex and LAD and RCA.  restenosis of RCA 2014 with drug eluting stent  . Hypercholesterolemia   . Collagen vascular  disease       Most Recent Cardiac Studies:   Status post exercise stress echo at [facility] in November, 2015 which was negative for ischemia  ; echocardiogram done today in the hospital revealed ejection fraction of 55-60% with no regional wall motion abnormalities.   Surgical History:  Past Surgical History  Procedure Laterality Date  . Joint replacement      knee replacement     Home Meds: Prior to Admission medications   Medication Sig Start Date End Date Taking? Authorizing Provider  acetaminophen (TYLENOL) 650 MG CR tablet Take 650 mg by mouth at bedtime as needed for pain.    Yes Historical Provider, MD  aspirin 81 MG tablet Take 81 mg by mouth daily.   Yes Historical Provider, MD  clopidogrel (PLAVIX) 75 MG tablet Take 75 mg by mouth daily.   Yes Historical Provider, MD  lisinopril (PRINIVIL,ZESTRIL) 10 MG tablet Take 10 mg by mouth daily.   Yes Historical Provider, MD  nebivolol (BYSTOLIC) 5 MG tablet Take 5 mg by mouth daily.   Yes Historical Provider, MD  potassium gluconate 595 MG TABS tablet Take 595 mg by mouth at bedtime as needed (leg cramps).   Yes Historical Provider, MD  rosuvastatin (CRESTOR) 20 MG tablet Take 20 mg by mouth at bedtime.   Yes Historical Provider, MD  rosuvastatin (CRESTOR) 10 MG tablet Take 10 mg by mouth daily.    Historical Provider, MD    Inpatient Medications:  . aspirin EC  81 mg Oral Daily  .  clopidogrel  75 mg Oral Daily  . enoxaparin (LOVENOX) injection  40 mg Subcutaneous Q24H  . lisinopril  10 mg Oral Daily  . nebivolol  5 mg Oral Daily  . rosuvastatin  10 mg Oral Daily  . rosuvastatin  20 mg Oral QHS      Allergies:  Allergies  Allergen Reactions  . Sulfa Antibiotics   . Latex Rash    History   Social History  . Marital Status: Married    Spouse Name: N/A  . Number of Children: N/A  . Years of Education: N/A   Occupational History  . Not on file.   Social History Main Topics  . Smoking status: Former Smoker  -- 1.00 packs/day for 8 years    Quit date: 08/12/1989  . Smokeless tobacco: Never Used  . Alcohol Use: 0.0 oz/week    0 Standard drinks or equivalent per week     Comment: rarely drinks alcohol  . Drug Use: No  . Sexual Activity: Yes    Birth Control/ Protection: Post-menopausal   Other Topics Concern  . Not on file   Social History Narrative     Family History  Problem Relation Age of Onset  . Heart disease Mother   . Hypertension Brother   . Heart disease Brother 103    several MIs  . Heart disease Daughter 5    deceased from MI     Review of Systems: complaints of speech difficulty with vision difficulty and some facial droop. General: negative for chills, fever, night sweats or weight changes.  Cardiovascular: negative for chest pain, edema, orthopnea, palpitations, paroxysmal nocturnal dyspnea, shortness of breath or dyspnea on exertion Dermatological: negative for rash Respiratory: negative for cough or wheezing Urologic: negative for hematuria Abdominal: negative for nausea, vomiting, diarrhea, bright red blood per rectum, melena, or hematemesis Neurologic:   Speech difficulty with visual difficulty i  As well as facial droop. All other systems reviewed and are otherwise negative except as noted above.  Labs:  Recent Labs  12/12/14 0925  TROPONINI <0.03   Lab Results  Component Value Date   WBC 5.3 12/12/2014   HGB 14.2 12/12/2014   HCT 43.1 12/12/2014   MCV 94.7 12/12/2014   PLT 177 12/12/2014    Recent Labs Lab 12/12/14 0925  NA 142  K 4.0  CL 106  CO2 27  BUN 17  CREATININE 0.77  CALCIUM 9.3  PROT 7.5  BILITOT 0.8  ALKPHOS 87  ALT 17  AST 20  GLUCOSE 111*   No results found for: CHOL, HDL, LDLCALC, TRIG No results found for: DDIMER  Radiology/Studies:  Ct Head Wo Contrast  12/13/2014   CLINICAL DATA:  Slurred speech, double vision  EXAM: CT HEAD WITHOUT CONTRAST  TECHNIQUE: Contiguous axial images were obtained from the base of the  skull through the vertex without intravenous contrast.  COMPARISON:  12/12/2014  FINDINGS: No evidence of parenchymal hemorrhage or extra-axial fluid collection. No mass lesion, mass effect, or midline shift.  No CT evidence of acute infarction.  Subcortical white matter and periventricular small vessel ischemic changes. Intracranial atherosclerosis.  Global cortical atrophy.  No ventriculomegaly.  The visualized paranasal sinuses are essentially clear. The mastoid air cells are unopacified.  No evidence of calvarial fracture.  IMPRESSION: No evidence of acute intracranial abnormality.  No interval change from recent CT.  Atrophy with small vessel ischemic changes.   Electronically Signed   By: Julian Hy M.D.   On: 12/13/2014 13:07  Ct Head Wo Contrast  12/12/2014   CLINICAL DATA:  81 year old female with altered mental status this morning, aphasia, diplopia. Initial encounter.  EXAM: CT HEAD WITHOUT CONTRAST  TECHNIQUE: Contiguous axial images were obtained from the base of the skull through the vertex without intravenous contrast.  COMPARISON:  Report of [facility] carotid Doppler ultrasound 10/17/2014 (no images available).  FINDINGS: Visualized paranasal sinuses and mastoids are clear. No acute osseous abnormality identified. Visualized orbits and scalp soft tissues are within normal limits.  Calcified atherosclerosis at the skull base. Cerebral volume is within normal limits for age. Dominant appearing distal left vertebral artery. No suspicious intracranial vascular hyperdensity. Mild for age mostly frontal lobe subcortical and periventricular white matter hypodensity. No evidence of cortically based acute infarction identified. No acute intracranial hemorrhage identified. No midline shift, mass effect, or evidence of intracranial mass lesion. No ventriculomegaly.  IMPRESSION: No acute intracranial abnormality. Mild for age nonspecific white matter changes, most commonly due to chronic small  vessel disease.   Electronically Signed   By: Genevie Ann M.D.   On: 12/12/2014 10:11   Ct Angio Neck W/cm &/or Wo/cm  12/12/2014   CLINICAL DATA:  Double vision with slurred speech since this morning. Fatigue. Recent outside carotid Doppler examination demonstrated 70% left carotid artery stenosis.  EXAM: CT ANGIOGRAPHY NECK  TECHNIQUE: Multidetector CT imaging of the neck was performed using the standard protocol during bolus administration of intravenous contrast. Multiplanar CT image reconstructions and MIPs were obtained to evaluate the vascular anatomy. Carotid stenosis measurements (when applicable) are obtained utilizing NASCET criteria, using the distal internal carotid diameter as the denominator.  CONTRAST:  165mL OMNIPAQUE IOHEXOL 350 MG/ML SOLN  COMPARISON:  None.  FINDINGS: Aortic arch: 3 vessel aortic arch with mild-to-moderate atherosclerotic plaque. Atherosclerotic plaque at the left subclavian artery origin does not result in significant stenosis. Brachiocephalic artery is patent without stenosis. There is less than 50% stenosis of the right subclavian artery origin due to a centric calcified plaque.  Right carotid system: Common carotid artery is patent with mild plaque distally and no significant stenosis. Moderate focal, eccentric plaque in the proximal ICA results in approximately 35% stenosis.  Left carotid system: Common carotid artery is patent without significant stenosis. There is moderate calcified plaque involving the carotid bifurcation and proximal ICA resulting in approximately 70% stenosis of the proximal ICA. More distal cervical ICA is unremarkable.  Vertebral arteries: Vertebral arteries are patent with the left being dominant. There is focal plaque in the proximal left V2 segment without stent significant stenosis.  Skeleton: Osteopenia and moderate lower cervical disc degeneration.  Other neck: Minimal pleural thickening in the lung apices. No neck mass or enlarged lymph nodes.   IMPRESSION: 1. 70% proximal left ICA stenosis. 2. 30% proximal right ICA stenosis. 3. Patent vertebral arteries without stenosis.   Electronically Signed   By: Logan Bores   On: 12/12/2014 16:48    EKG:  Normal sinus rhythm with nonspecific ST T wave changes  Weights: Filed Weights   12/12/14 1240 12/12/14 1251 12/13/14 0605  Weight: 71.079 kg (156 lb 11.2 oz) 71.079 kg (156 lb 11.2 oz) 73.211 kg (161 lb 6.4 oz)     Physical Exam: Blood pressure 160/82, pulse 66, temperature 97.8 F (36.6 C), temperature source Oral, resp. rate 19, height 5' 4.5" (1.638 m), weight 73.211 kg (161 lb 6.4 oz), SpO2 96 %. Body mass index is 27.29 kg/(m^2). General: Well developed, well nourished, in no acute distress. Head: Normocephalic,  atraumatic, sclera non-icteric, no xanthomas, nares are without discharge.  Neck:    bilateral carotid bruits. JVD not elevated. Lungs: Clear bilaterally to auscultation without wheezes, rales, or rhonchi. Breathing is unlabored. Heart: RRR with S1 S2. No murmurs, rubs, or gallops appreciated. Abdomen: Soft, non-tender, non-distended with normoactive bowel sounds. No hepatomegaly. No rebound/guarding. No obvious abdominal masses. Msk:  Strength and tone appear normal for age. Extremities: No clubbing or cyanosis. No edema.  Distal pedal pulses are 2+ and equal bilaterally. Neuro: Alert and oriented X 3.  Mild facial droop .. . Moves all extremities spontaneously. Psych:  Responds to questions appropriately with a normal affect.    Assessment and Plan:   Patient's history of severe coronary artery disease status post PCI a in her LAD, circumflex and RCA with InStent restenoses in the past most recently he in 2014 in right coronary artery.  She had a negative exercise stress echo in November, 2015 but has a history of significant disease on documented by a functional studies.  At this point I feel she would be at fairly high risk for stopping dual anti-platelet therapy given  her InStent restenoses for surgery.  If possible, consideration for stenting of her carotid artery   Allowing her to remain on dual anti-platelet therapy would be ideal. However if this is not feasible technically, Plavix could be stopped to would recommend remaining on aspirin.  Patient's echocardiogram today revealed preserved left ventricular function with no regional wall motion abnormalities or significant valvular abnormalities.  Signed, Kimisha Eunice A.  12/13/2014, 1:50 PM

## 2014-12-13 NOTE — Progress Notes (Signed)
[facility]  Daily Progress Note   Subjective  - TIA, no new symptoms Had CTA last night and I have inpedently reviewed.  70% moderately calcific left ICA stenosis.  Mild right ICA stenosis   Objective Filed Vitals:   12/12/14 1251 12/12/14 2001 12/13/14 0453 12/13/14 0605  BP:  150/65 128/62   Pulse:  72 66   Temp:  98.2 F (36.8 C) 97.8 F (36.6 C)   TempSrc:  Oral Oral   Resp:  19 19   Height: 5' 4.5" (1.638 m)     Weight: 71.079 kg (156 lb 11.2 oz)   73.211 kg (161 lb 6.4 oz)  SpO2:  99% 96%     Intake/Output Summary (Last 24 hours) at 12/13/14 0947 Last data filed at 12/13/14 0606  Gross per 24 hour  Intake    360 ml  Output   1400 ml  Net  -1040 ml    PULM  CTAB CV  RRR VASC  Left carotid bruit  Laboratory CBC    Component Value Date/Time   WBC 5.3 12/12/2014 0925   WBC 6.1 09/28/2012   HGB 14.2 12/12/2014 0925   HCT 43.1 12/12/2014 0925   PLT 177 12/12/2014 0925    BMET    Component Value Date/Time   NA 142 12/12/2014 0925   NA 140 10/01/2012 0531   K 4.0 12/12/2014 0925   K 4.3 10/01/2012 0531   CL 106 12/12/2014 0925   CL 109* 10/01/2012 0531   CO2 27 12/12/2014 0925   CO2 23 10/01/2012 0531   GLUCOSE 111* 12/12/2014 0925   GLUCOSE 136* 10/01/2012 0531   BUN 17 12/12/2014 0925   BUN 16 10/01/2012 0531   CREATININE 0.77 12/12/2014 0925   CREATININE 0.63 10/01/2012 0531   CALCIUM 9.3 12/12/2014 0925   CALCIUM 8.8 10/01/2012 0531   GFRNONAA >60 12/12/2014 0925   GFRNONAA >60 10/01/2012 0531   GFRAA >60 12/12/2014 0925   GFRAA >60 10/01/2012 0531    Assessment/Planning: Symptomatic left ICA stenosis of about 70% by CTA   Getting Echo today  REcommend Cardiology consult to assess preoperative status.  If she is felt to be low risk for CEA, will need to stop Plavix and plan to do this next weel  If she is felt higher risk for open surgery, would consider carotid artery stenting later this week  Will  follow    Tequlia Gonsalves  12/13/2014, 9:47 AM

## 2014-12-13 NOTE — Progress Notes (Signed)
Pt in NAD, skin warm and dry, respirations even and unlabored.  VSS, SR per monitor.  IV and telemetry discontinued per policy and procedure.  Discharge instructions given to and reviewed with pt.  Pt verbalized understanding.  Pt discharged home. Pt asked about " the heavy feeling on the top of my head", per Dr. Ubaldo Glassing, pt can be discharged.

## 2014-12-19 ENCOUNTER — Inpatient Hospital Stay
Admission: RE | Admit: 2014-12-19 | Discharge: 2014-12-20 | DRG: 036 | Disposition: A | Discharge status: Home / Self Care | Payer: Medicare Other | Source: Ambulatory Visit | Attending: Vascular Surgery | Admitting: Vascular Surgery

## 2014-12-19 ENCOUNTER — Encounter
Admission: RE | Disposition: A | Discharge status: Home / Self Care | Payer: Medicare Other | Source: Ambulatory Visit | Attending: Vascular Surgery

## 2014-12-19 ENCOUNTER — Encounter: Payer: Self-pay | Admitting: *Deleted

## 2014-12-19 DIAGNOSIS — I739 Peripheral vascular disease, unspecified: Secondary | ICD-10-CM | POA: Diagnosis present

## 2014-12-19 DIAGNOSIS — Z91040 Latex allergy status: Secondary | ICD-10-CM

## 2014-12-19 DIAGNOSIS — Z96651 Presence of right artificial knee joint: Secondary | ICD-10-CM | POA: Diagnosis present

## 2014-12-19 DIAGNOSIS — Z955 Presence of coronary angioplasty implant and graft: Secondary | ICD-10-CM | POA: Diagnosis not present

## 2014-12-19 DIAGNOSIS — I6522 Occlusion and stenosis of left carotid artery: Secondary | ICD-10-CM | POA: Diagnosis present

## 2014-12-19 DIAGNOSIS — Z882 Allergy status to sulfonamides status: Secondary | ICD-10-CM

## 2014-12-19 DIAGNOSIS — I2510 Atherosclerotic heart disease of native coronary artery without angina pectoris: Secondary | ICD-10-CM | POA: Diagnosis present

## 2014-12-19 DIAGNOSIS — I519 Heart disease, unspecified: Secondary | ICD-10-CM | POA: Diagnosis present

## 2014-12-19 DIAGNOSIS — I6529 Occlusion and stenosis of unspecified carotid artery: Secondary | ICD-10-CM

## 2014-12-19 DIAGNOSIS — Z7902 Long term (current) use of antithrombotics/antiplatelets: Secondary | ICD-10-CM | POA: Diagnosis not present

## 2014-12-19 DIAGNOSIS — E785 Hyperlipidemia, unspecified: Secondary | ICD-10-CM | POA: Diagnosis present

## 2014-12-19 DIAGNOSIS — I10 Essential (primary) hypertension: Secondary | ICD-10-CM | POA: Diagnosis present

## 2014-12-19 DIAGNOSIS — Z8673 Personal history of transient ischemic attack (TIA), and cerebral infarction without residual deficits: Secondary | ICD-10-CM | POA: Diagnosis not present

## 2014-12-19 HISTORY — PX: PERIPHERAL VASCULAR CATHETERIZATION: SHX172C

## 2014-12-19 HISTORY — DX: Peripheral vascular disease, unspecified: I73.9

## 2014-12-19 LAB — CREATININE, SERUM
Creatinine, Ser: 0.72 mg/dL (ref 0.44–1.00)
GFR calc Af Amer: >60 mL/min (ref >60)
GFR calc non Af Amer: >60 mL/min (ref >60)

## 2014-12-19 LAB — GLUCOSE, CAPILLARY: Glucose-Capillary: 82 mg/dL (ref 70–99)

## 2014-12-19 LAB — BUN: BUN: 17 mg/dL (ref 6–20)

## 2014-12-19 SURGERY — CAROTID PTA/STENT INTERVENTION
Anesthesia: Moderate Sedation | Laterality: Left

## 2014-12-19 MED ORDER — HYDROMORPHONE HCL 1 MG/ML IJ SOLN
1.0000 mg | INTRAMUSCULAR | Status: DC | PRN
Start: 2014-12-19 — End: 2014-12-20

## 2014-12-19 MED ORDER — ALUM & MAG HYDROXIDE-SIMETH 200-200-20 MG/5ML PO SUSP: 15.0000 mL | ORAL | Status: DC | PRN

## 2014-12-19 MED ORDER — PHENYLEPHRINE HCL 10 MG/ML IJ SOLN
INTRAMUSCULAR | Status: AC
  Filled 2014-12-19: qty 1

## 2014-12-19 MED ORDER — ASPIRIN EC 81 MG PO TBEC
81.0000 mg | DELAYED_RELEASE_TABLET | Freq: Every day | ORAL | Status: DC
  Administered 2014-12-19 – 2014-12-20 (×2): 81 mg via ORAL
  Filled 2014-12-19 (×2): qty 1

## 2014-12-19 MED ORDER — MIDAZOLAM HCL 2 MG/2ML IJ SOLN
INTRAMUSCULAR | Status: DC | PRN
  Administered 2014-12-19: 1 mg via INTRAVENOUS

## 2014-12-19 MED ORDER — POTASSIUM CHLORIDE CRYS ER 20 MEQ PO TBCR: 20.0000 meq | EXTENDED_RELEASE_TABLET | Freq: Every day | ORAL | Status: DC | PRN

## 2014-12-19 MED ORDER — ACETAMINOPHEN 325 MG PO TABS: 325.0000 mg | ORAL_TABLET | ORAL | Status: DC | PRN

## 2014-12-19 MED ORDER — SODIUM CHLORIDE 0.9 % IV SOLN
INTRAVENOUS | Status: DC
  Administered 2014-12-19: 14:00:00 via INTRAVENOUS

## 2014-12-19 MED ORDER — SODIUM CHLORIDE 0.9 % IV SOLN: 500.0000 mL | Freq: Once | INTRAVENOUS | Status: AC | PRN

## 2014-12-19 MED ORDER — FENTANYL CITRATE (PF) 100 MCG/2ML IJ SOLN
INTRAMUSCULAR | Status: AC
  Filled 2014-12-19: qty 2

## 2014-12-19 MED ORDER — LABETALOL HCL 5 MG/ML IV SOLN: 10.0000 mg | INTRAVENOUS | Status: DC | PRN

## 2014-12-19 MED ORDER — HYDRALAZINE HCL 20 MG/ML IJ SOLN: 5.0000 mg | INTRAMUSCULAR | Status: DC | PRN

## 2014-12-19 MED ORDER — HEPARIN SODIUM (PORCINE) 1000 UNIT/ML IJ SOLN
INTRAMUSCULAR | Status: DC | PRN
  Administered 2014-12-19: 2000 [IU] via INTRAVENOUS
  Administered 2014-12-19: 4000 [IU] via INTRAVENOUS

## 2014-12-19 MED ORDER — HEPARIN SODIUM (PORCINE) 1000 UNIT/ML IJ SOLN
INTRAMUSCULAR | Status: AC
  Filled 2014-12-19: qty 1

## 2014-12-19 MED ORDER — ONDANSETRON HCL 4 MG/2ML IJ SOLN: 4.0000 mg | Freq: Four times a day (QID) | INTRAMUSCULAR | Status: DC | PRN

## 2014-12-19 MED ORDER — MORPHINE SULFATE 4 MG/ML IJ SOLN
2.0000 mg | INTRAMUSCULAR | Status: DC | PRN
  Administered 2014-12-19: 4 mg via INTRAVENOUS
  Filled 2014-12-19: qty 1

## 2014-12-19 MED ORDER — MIDAZOLAM HCL 5 MG/5ML IJ SOLN
INTRAMUSCULAR | Status: AC
  Filled 2014-12-19: qty 5

## 2014-12-19 MED ORDER — HEPARIN (PORCINE) IN NACL 2-0.9 UNIT/ML-% IJ SOLN
INTRAMUSCULAR | Status: AC
  Filled 2014-12-19: qty 1000

## 2014-12-19 MED ORDER — MAGNESIUM SULFATE 2 GM/50ML IV SOLN
2.0000 g | Freq: Every day | INTRAVENOUS | Status: DC | PRN
  Filled 2014-12-19: qty 50

## 2014-12-19 MED ORDER — LIDOCAINE-EPINEPHRINE (PF) 1 %-1:200000 IJ SOLN
INTRAMUSCULAR | Status: AC
  Filled 2014-12-19: qty 30

## 2014-12-19 MED ORDER — CEFAZOLIN SODIUM 1-5 GM-% IV SOLN
1.0000 g | Freq: Once | INTRAVENOUS | Status: AC
  Administered 2014-12-19: 1 g via INTRAVENOUS

## 2014-12-19 MED ORDER — DOPAMINE-DEXTROSE 3.2-5 MG/ML-% IV SOLN: 0.0000 ug/kg/min | INTRAVENOUS | Status: DC

## 2014-12-19 MED ORDER — FENTANYL CITRATE (PF) 100 MCG/2ML IJ SOLN
INTRAMUSCULAR | Status: DC | PRN
  Administered 2014-12-19: 25 ug via INTRAVENOUS

## 2014-12-19 MED ORDER — ATROPINE SULFATE 1 MG/ML IJ SOLN
0.5000 mg | Freq: Once | INTRAMUSCULAR | Status: AC | PRN
  Filled 2014-12-19: qty 0.5

## 2014-12-19 MED ORDER — DEXTROSE 50 % IV SOLN
0.5000 | Freq: Once | INTRAVENOUS | Status: AC | PRN
  Filled 2014-12-19: qty 50

## 2014-12-19 MED ORDER — OXYCODONE-ACETAMINOPHEN 5-325 MG PO TABS
1.0000 | ORAL_TABLET | ORAL | Status: DC | PRN
  Administered 2014-12-19: 1 via ORAL
  Filled 2014-12-19: qty 1

## 2014-12-19 MED ORDER — ACETAMINOPHEN 325 MG RE SUPP
325.0000 mg | RECTAL | Status: DC | PRN
  Filled 2014-12-19: qty 2

## 2014-12-19 MED ORDER — GUAIFENESIN-DM 100-10 MG/5ML PO SYRP: 15.0000 mL | ORAL_SOLUTION | ORAL | Status: DC | PRN

## 2014-12-19 MED ORDER — CLOPIDOGREL BISULFATE 75 MG PO TABS
75.0000 mg | ORAL_TABLET | Freq: Every day | ORAL | Status: DC
  Administered 2014-12-20: 75 mg via ORAL
  Filled 2014-12-19: qty 1

## 2014-12-19 MED ORDER — SODIUM CHLORIDE 0.9 % IJ SOLN
INTRAMUSCULAR | Status: AC
  Filled 2014-12-19: qty 3

## 2014-12-19 MED ORDER — PHENOL 1.4 % MT LIQD
1.0000 | OROMUCOSAL | Status: DC | PRN
  Filled 2014-12-19: qty 177

## 2014-12-19 MED ORDER — CEFAZOLIN SODIUM 1-5 GM-% IV SOLN
INTRAVENOUS | Status: AC
  Filled 2014-12-19: qty 50

## 2014-12-19 MED ORDER — ATROPINE SULFATE 0.1 MG/ML IJ SOLN
INTRAMUSCULAR | Status: AC
  Filled 2014-12-19: qty 10

## 2014-12-19 MED ORDER — ONDANSETRON HCL 4 MG/2ML IJ SOLN: 4.0000 mg | INTRAMUSCULAR | Status: DC | PRN

## 2014-12-19 SURGICAL SUPPLY — 14 items
BALLN VIATRAC 5X20X135 (BALLOONS) ×2
BALLOON VIATRAC 5X20X135 (BALLOONS) ×1 IMPLANT
CATH BEACON 5 .035 100 JB2 TIP (CATHETERS) ×2 IMPLANT
CATH PIG 5.0X110 10S (CATHETERS) ×2 IMPLANT
DEVICE EMBOSHIELD NAV6 4.0-7.0 (WIRE) ×2 IMPLANT
DEVICE PRESTO INFLATION (MISCELLANEOUS) ×2 IMPLANT
DEVICE STARCLOSE SE CLOSURE (Vascular Products) ×2 IMPLANT
DEVICE TORQUE (MISCELLANEOUS) ×2 IMPLANT
GLIDEWIRE .035 260CM (WIRE) ×2 IMPLANT
SHEATH BRITE TIP 6FRX11 (SHEATH) ×2 IMPLANT
SHEATH SHUTTLE SELECT 6F (SHEATH) ×2 IMPLANT
STENT XACT CAR 9-7X30X136 (Permanent Stent) ×2 IMPLANT
WIRE AMPLATZ SSTIFF .035X260CM (WIRE) ×2 IMPLANT
WIRE J 3MM .035X145CM (WIRE) ×2 IMPLANT

## 2014-12-19 NOTE — H&P (Signed)
 [facility] History & Physical Update  The patient was interviewed and re-examined.  The patient's previous History and Physical has been reviewed and is unchanged.  There is no change in the plan of care.  Abhijot Straughter, MD  12/19/2014, 12:31 PM

## 2014-12-20 LAB — BASIC METABOLIC PANEL
ANION GAP: 5 (ref 5–15)
BUN: 14 mg/dL (ref 6–20)
CHLORIDE: 108 mmol/L (ref 101–111)
CO2: 28 mmol/L (ref 22–32)
Calcium: 8.7 mg/dL — ABNORMAL LOW (ref 8.9–10.3)
Creatinine, Ser: 0.8 mg/dL (ref 0.44–1.00)
GFR calc Af Amer: >60 mL/min (ref >60)
GFR calc non Af Amer: >60 mL/min (ref >60)
Glucose, Bld: 103 mg/dL — ABNORMAL HIGH (ref 65–99)
Potassium: 4 mmol/L (ref 3.5–5.1)
Sodium: 141 mmol/L (ref 135–145)

## 2014-12-20 LAB — CBC
HCT: 37.3 % (ref 35.0–47.0)
HEMOGLOBIN: 12.4 g/dL (ref 12.0–16.0)
MCH: 31.5 pg (ref 26.0–34.0)
MCHC: 33.3 g/dL (ref 32.0–36.0)
MCV: 94.6 fL (ref 80.0–100.0)
PLATELETS: 147 10*3/uL — AB (ref 150–440)
RBC: 3.95 MIL/uL (ref 3.80–5.20)
RDW: 12.6 % (ref 11.5–14.5)
WBC: 4.8 10*3/uL (ref 3.6–11.0)

## 2014-12-20 NOTE — Progress Notes (Signed)
[facility]  Daily Progress Note   Subjective  - 1 Day Post-Op  Did well overnight Mild neck and groin soreness Neuro exam normal Tolerated diet  Objective Filed Vitals:   12/20/14 0300 12/20/14 0400 12/20/14 0500 12/20/14 0600  BP: 115/51 133/64 121/51 124/65  Pulse: 56 64 63 64  Temp:  98.9 F (37.2 C)    TempSrc:      Resp: 14 17 16 15   Height:      Weight:      SpO2: 95% 96% 96% 97%    Intake/Output Summary (Last 24 hours) at 12/20/14 0821 Last data filed at 12/19/14 2208  Gross per 24 hour  Intake    245 ml  Output    800 ml  Net   -555 ml    PULM  CTAB CV  RRR VASC  Access site c/d/i Neuro exam normal  Laboratory CBC    Component Value Date/Time   WBC 4.8 12/20/2014 0520   WBC 6.1 09/28/2012   HGB 12.4 12/20/2014 0520   HCT 37.3 12/20/2014 0520   PLT 147* 12/20/2014 0520    BMET    Component Value Date/Time   NA 141 12/20/2014 0520   NA 140 10/01/2012 0531   K 4.0 12/20/2014 0520   K 4.3 10/01/2012 0531   CL 108 12/20/2014 0520   CL 109* 10/01/2012 0531   CO2 28 12/20/2014 0520   CO2 23 10/01/2012 0531   GLUCOSE 103* 12/20/2014 0520   GLUCOSE 136* 10/01/2012 0531   BUN 14 12/20/2014 0520   BUN 16 10/01/2012 0531   CREATININE 0.80 12/20/2014 0520   CREATININE 0.63 10/01/2012 0531   CALCIUM 8.7* 12/20/2014 0520   CALCIUM 8.8 10/01/2012 0531   GFRNONAA >60 12/20/2014 0520   GFRNONAA >60 10/01/2012 0531   GFRAA >60 12/20/2014 0520   GFRAA >60 10/01/2012 0531    Assessment/Planning: POD #1 s/p left carotid stent placement   Doing well  Plan discharge home today on Plavix and ASA  RTC 3-4 weeks with carotid duplex in office    Kelly Ritter  12/20/2014, 8:21 AM

## 2014-12-20 NOTE — Discharge Summary (Signed)
[facility]    Discharge Summary    Patient ID:  JAALA Ritter MRN: [id number] DOB/AGE: [birth year] 79 y.o.  Admit date: 12/19/2014 Discharge date: 12/20/2014 Date of Surgery: 12/19/2014 Surgeon: Surgeon(s): Algernon Huxley, MD  Admission Diagnosis: Carotid Artery Stenosis  Discharge Diagnoses:  Carotid Artery Stenosis  Secondary Diagnoses: Past Medical History  Diagnosis Date  . Hypertension   . Hiatal hernia   . CAD (coronary artery disease)     s/p PCI and stent placement of circumflex and LAD and RCA.  restenosis of RCA 2014 with drug eluting stent  . Hypercholesterolemia   . Collagen vascular disease   . Peripheral vascular disease     Procedure(s): Carotid PTA/Stent Intervention  Discharged Condition: stable  HPI:  Symptomatic left carotid stenosis.  High risk for CEA with cardiac disease and seven previous stents not able to come off of plavix  Hospital Course:  Kelly Ritter is a 81 y.o. female is S/P Left carotid stent for symptomatic carotid stenosis Procedure(s): Carotid PTA/Stent Intervention  Physical exam: Neuro exam normal Post-op wounds access incision c/d/I Pt. Ambulating, voiding and taking PO diet without difficulty. Pt pain controlled with PO pain meds. Labs as below Complications: None  Consults:     Significant Diagnostic Studies: CBC Lab Results  Component Value Date   WBC 4.8 12/20/2014   HGB 12.4 12/20/2014   HCT 37.3 12/20/2014   MCV 94.6 12/20/2014   PLT 147* 12/20/2014    BMET    Component Value Date/Time   NA 141 12/20/2014 0520   NA 140 10/01/2012 0531   K 4.0 12/20/2014 0520   K 4.3 10/01/2012 0531   CL 108 12/20/2014 0520   CL 109* 10/01/2012 0531   CO2 28 12/20/2014 0520   CO2 23 10/01/2012 0531   GLUCOSE 103* 12/20/2014 0520   GLUCOSE 136* 10/01/2012 0531   BUN 14 12/20/2014 0520   BUN 16 10/01/2012 0531   CREATININE 0.80 12/20/2014 0520   CREATININE 0.63 10/01/2012 0531   CALCIUM  8.7* 12/20/2014 0520   CALCIUM 8.8 10/01/2012 0531   GFRNONAA >60 12/20/2014 0520   GFRNONAA >60 10/01/2012 0531   GFRAA >60 12/20/2014 0520   GFRAA >60 10/01/2012 0531   COAG Lab Results  Component Value Date   INR 0.99 12/12/2014     Disposition:  Discharge to :home     Medication List    ASK your doctor about these medications        acetaminophen 650 MG CR tablet  Commonly known as:  TYLENOL  Take 650 mg by mouth at bedtime as needed for pain.     aspirin 81 MG tablet  Take 81 mg by mouth daily.     clopidogrel 75 MG tablet  Commonly known as:  PLAVIX  Take 75 mg by mouth daily.     lisinopril 10 MG tablet  Commonly known as:  PRINIVIL,ZESTRIL  Take 10 mg by mouth daily.     nebivolol 5 MG tablet  Commonly known as:  BYSTOLIC  Take 5 mg by mouth daily.     potassium gluconate 595 MG Tabs tablet  Take 595 mg by mouth at bedtime as needed (leg cramps).     rosuvastatin 20 MG tablet  Commonly known as:  CRESTOR  Take 20 mg by mouth at bedtime.       Verbal and written Discharge instructions given to the patient. Wound care per Discharge AVS   Signed: Leotis Pain, MD  12/20/2014, 8:22 AM

## 2014-12-20 NOTE — Discharge Summary (Signed)
Pt discharged home home with husband, transported out via wheelchair.  Alert and oriented, no complaints of pain.  NSR, lungs clear on room air.  Tolerating diet, ambulating in unit without incident.  VSS, afebrile.  Iv's discontinued without incident.  Discharge instructions, home meds, and follow up appointments reviewed with pt.  Pt and husband state they understand discharge instructions and have no questions.

## 2014-12-28 ENCOUNTER — Encounter: Payer: Self-pay | Admitting: Vascular Surgery

## 2015-01-06 DIAGNOSIS — I10 Essential (primary) hypertension: Secondary | ICD-10-CM | POA: Insufficient documentation

## 2015-05-15 DIAGNOSIS — C4492 Squamous cell carcinoma of skin, unspecified: Secondary | ICD-10-CM

## 2015-05-15 HISTORY — DX: Squamous cell carcinoma of skin, unspecified: C44.92

## 2015-08-02 DIAGNOSIS — Z85828 Personal history of other malignant neoplasm of skin: Secondary | ICD-10-CM | POA: Insufficient documentation

## 2015-08-13 DIAGNOSIS — S060X9A Concussion with loss of consciousness of unspecified duration, initial encounter: Secondary | ICD-10-CM

## 2015-08-13 DIAGNOSIS — S060XAA Concussion with loss of consciousness status unknown, initial encounter: Secondary | ICD-10-CM

## 2015-08-13 HISTORY — DX: Concussion with loss of consciousness status unknown, initial encounter: S06.0XAA

## 2015-08-13 HISTORY — DX: Concussion with loss of consciousness of unspecified duration, initial encounter: S06.0X9A

## 2015-09-24 ENCOUNTER — Emergency Department: Payer: Medicare Other

## 2015-09-24 ENCOUNTER — Observation Stay
Admission: EM | Admit: 2015-09-24 | Discharge: 2015-09-25 | Disposition: A | Discharge status: Home / Self Care | Payer: Medicare Other | Attending: Internal Medicine | Admitting: Internal Medicine

## 2015-09-24 DIAGNOSIS — I200 Unstable angina: Secondary | ICD-10-CM | POA: Diagnosis present

## 2015-09-24 DIAGNOSIS — K449 Diaphragmatic hernia without obstruction or gangrene: Secondary | ICD-10-CM | POA: Insufficient documentation

## 2015-09-24 DIAGNOSIS — R0602 Shortness of breath: Secondary | ICD-10-CM | POA: Insufficient documentation

## 2015-09-24 DIAGNOSIS — Z7902 Long term (current) use of antithrombotics/antiplatelets: Secondary | ICD-10-CM | POA: Diagnosis not present

## 2015-09-24 DIAGNOSIS — I739 Peripheral vascular disease, unspecified: Secondary | ICD-10-CM | POA: Diagnosis not present

## 2015-09-24 DIAGNOSIS — Z96659 Presence of unspecified artificial knee joint: Secondary | ICD-10-CM | POA: Diagnosis not present

## 2015-09-24 DIAGNOSIS — R079 Chest pain, unspecified: Secondary | ICD-10-CM | POA: Diagnosis present

## 2015-09-24 DIAGNOSIS — Z882 Allergy status to sulfonamides status: Secondary | ICD-10-CM | POA: Insufficient documentation

## 2015-09-24 DIAGNOSIS — I2510 Atherosclerotic heart disease of native coronary artery without angina pectoris: Secondary | ICD-10-CM | POA: Diagnosis not present

## 2015-09-24 DIAGNOSIS — Z8673 Personal history of transient ischemic attack (TIA), and cerebral infarction without residual deficits: Secondary | ICD-10-CM | POA: Insufficient documentation

## 2015-09-24 DIAGNOSIS — E785 Hyperlipidemia, unspecified: Secondary | ICD-10-CM | POA: Insufficient documentation

## 2015-09-24 DIAGNOSIS — I517 Cardiomegaly: Secondary | ICD-10-CM | POA: Diagnosis not present

## 2015-09-24 DIAGNOSIS — I10 Essential (primary) hypertension: Secondary | ICD-10-CM | POA: Insufficient documentation

## 2015-09-24 DIAGNOSIS — Z955 Presence of coronary angioplasty implant and graft: Secondary | ICD-10-CM | POA: Diagnosis not present

## 2015-09-24 DIAGNOSIS — Z7982 Long term (current) use of aspirin: Secondary | ICD-10-CM | POA: Insufficient documentation

## 2015-09-24 DIAGNOSIS — E7800 Pure hypercholesterolemia, unspecified: Secondary | ICD-10-CM | POA: Diagnosis not present

## 2015-09-24 DIAGNOSIS — R002 Palpitations: Secondary | ICD-10-CM | POA: Insufficient documentation

## 2015-09-24 DIAGNOSIS — I259 Chronic ischemic heart disease, unspecified: Secondary | ICD-10-CM | POA: Insufficient documentation

## 2015-09-24 DIAGNOSIS — Z8249 Family history of ischemic heart disease and other diseases of the circulatory system: Secondary | ICD-10-CM | POA: Diagnosis not present

## 2015-09-24 DIAGNOSIS — I998 Other disorder of circulatory system: Secondary | ICD-10-CM | POA: Diagnosis not present

## 2015-09-24 DIAGNOSIS — Z79899 Other long term (current) drug therapy: Secondary | ICD-10-CM | POA: Diagnosis not present

## 2015-09-24 DIAGNOSIS — I209 Angina pectoris, unspecified: Secondary | ICD-10-CM | POA: Diagnosis present

## 2015-09-24 DIAGNOSIS — Z87891 Personal history of nicotine dependence: Secondary | ICD-10-CM | POA: Diagnosis not present

## 2015-09-24 DIAGNOSIS — R5383 Other fatigue: Secondary | ICD-10-CM | POA: Diagnosis not present

## 2015-09-24 LAB — TROPONIN I
Troponin I: <0.03 ng/mL (ref <0.031)
Troponin I: <0.03 ng/mL (ref <0.031)

## 2015-09-24 LAB — BASIC METABOLIC PANEL
ANION GAP: 9 (ref 5–15)
BUN: 13 mg/dL (ref 6–20)
CHLORIDE: 107 mmol/L (ref 101–111)
CO2: 25 mmol/L (ref 22–32)
Calcium: 9.4 mg/dL (ref 8.9–10.3)
Creatinine, Ser: 0.81 mg/dL (ref 0.44–1.00)
GFR calc Af Amer: >60 mL/min (ref >60)
GLUCOSE: 110 mg/dL — AB (ref 65–99)
POTASSIUM: 4.5 mmol/L (ref 3.5–5.1)
Sodium: 141 mmol/L (ref 135–145)

## 2015-09-24 LAB — CBC
HEMATOCRIT: 43.2 % (ref 35.0–47.0)
HEMOGLOBIN: 14.5 g/dL (ref 12.0–16.0)
MCH: 30.7 pg (ref 26.0–34.0)
MCHC: 33.6 g/dL (ref 32.0–36.0)
MCV: 91.3 fL (ref 80.0–100.0)
Platelets: 180 10*3/uL (ref 150–440)
RBC: 4.74 MIL/uL (ref 3.80–5.20)
RDW: 13.8 % (ref 11.5–14.5)
WBC: 5.8 10*3/uL (ref 3.6–11.0)

## 2015-09-24 MED ORDER — POTASSIUM GLUCONATE 595 (99 K) MG PO TABS: 595.0000 mg | ORAL_TABLET | Freq: Every evening | ORAL | Status: DC | PRN

## 2015-09-24 MED ORDER — ASPIRIN 81 MG PO CHEW
162.0000 mg | CHEWABLE_TABLET | Freq: Once | ORAL | Status: AC
  Administered 2015-09-24: 162 mg via ORAL

## 2015-09-24 MED ORDER — SODIUM CHLORIDE 0.9% FLUSH: 3.0000 mL | INTRAVENOUS | Status: DC | PRN

## 2015-09-24 MED ORDER — CLOPIDOGREL BISULFATE 75 MG PO TABS
75.0000 mg | ORAL_TABLET | Freq: Every day | ORAL | Status: DC
  Administered 2015-09-25: 75 mg via ORAL
  Filled 2015-09-24: qty 1

## 2015-09-24 MED ORDER — SENNA 8.6 MG PO TABS
1.0000 | ORAL_TABLET | Freq: Two times a day (BID) | ORAL | Status: DC
  Administered 2015-09-24: 8.6 mg via ORAL
  Filled 2015-09-24: qty 1

## 2015-09-24 MED ORDER — ACETAMINOPHEN 325 MG PO TABS: 650.0000 mg | ORAL_TABLET | Freq: Four times a day (QID) | ORAL | Status: DC | PRN

## 2015-09-24 MED ORDER — ACETAMINOPHEN 650 MG RE SUPP: 650.0000 mg | Freq: Four times a day (QID) | RECTAL | Status: DC | PRN

## 2015-09-24 MED ORDER — ASPIRIN 81 MG PO CHEW
CHEWABLE_TABLET | ORAL | Status: AC
  Administered 2015-09-24: 162 mg via ORAL
  Filled 2015-09-24: qty 2

## 2015-09-24 MED ORDER — ENOXAPARIN SODIUM 40 MG/0.4ML ~~LOC~~ SOLN
40.0000 mg | SUBCUTANEOUS | Status: DC
  Administered 2015-09-24: 40 mg via SUBCUTANEOUS
  Filled 2015-09-24: qty 0.4

## 2015-09-24 MED ORDER — NITROGLYCERIN 0.4 MG SL SUBL
SUBLINGUAL_TABLET | SUBLINGUAL | Status: AC
  Administered 2015-09-24: 0.4 mg via SUBLINGUAL
  Filled 2015-09-24: qty 1

## 2015-09-24 MED ORDER — MORPHINE SULFATE (PF) 2 MG/ML IV SOLN: 2.0000 mg | INTRAVENOUS | Status: DC | PRN

## 2015-09-24 MED ORDER — SODIUM CHLORIDE 0.9% FLUSH
3.0000 mL | Freq: Two times a day (BID) | INTRAVENOUS | Status: DC
  Administered 2015-09-24 – 2015-09-25 (×2): 3 mL via INTRAVENOUS

## 2015-09-24 MED ORDER — NEBIVOLOL HCL 5 MG PO TABS
5.0000 mg | ORAL_TABLET | Freq: Every day | ORAL | Status: DC
  Administered 2015-09-25: 5 mg via ORAL
  Filled 2015-09-24 (×2): qty 1

## 2015-09-24 MED ORDER — ROSUVASTATIN CALCIUM 20 MG PO TABS
20.0000 mg | ORAL_TABLET | Freq: Every day | ORAL | Status: DC
  Administered 2015-09-24: 20 mg via ORAL
  Filled 2015-09-24: qty 1

## 2015-09-24 MED ORDER — NITROGLYCERIN 0.4 MG SL SUBL
0.4000 mg | SUBLINGUAL_TABLET | SUBLINGUAL | Status: DC | PRN
  Administered 2015-09-24: 0.4 mg via SUBLINGUAL

## 2015-09-24 MED ORDER — INFLUENZA VAC SPLIT QUAD 0.5 ML IM SUSY: 0.5000 mL | PREFILLED_SYRINGE | INTRAMUSCULAR | Status: DC

## 2015-09-24 MED ORDER — OXYCODONE HCL 5 MG PO TABS: 5.0000 mg | ORAL_TABLET | ORAL | Status: DC | PRN

## 2015-09-24 MED ORDER — ONDANSETRON HCL 4 MG PO TABS: 4.0000 mg | ORAL_TABLET | Freq: Four times a day (QID) | ORAL | Status: DC | PRN

## 2015-09-24 MED ORDER — LISINOPRIL 10 MG PO TABS
10.0000 mg | ORAL_TABLET | Freq: Every day | ORAL | Status: DC
  Administered 2015-09-24: 10 mg via ORAL
  Filled 2015-09-24 (×2): qty 1

## 2015-09-24 MED ORDER — ASPIRIN EC 325 MG PO TBEC: 325.0000 mg | DELAYED_RELEASE_TABLET | Freq: Every day | ORAL | Status: DC

## 2015-09-24 MED ORDER — ONDANSETRON HCL 4 MG/2ML IJ SOLN: 4.0000 mg | Freq: Four times a day (QID) | INTRAMUSCULAR | Status: DC | PRN

## 2015-09-24 NOTE — Care Management Obs Status (Signed)
[place] NOTIFICATION   Patient Details  Name: Kelly Ritter MRN: [id number] Date of Birth: 04-Feb-1934   Medicare Observation Status Notification Given:  Yes    Ival Bible, RN 09/24/2015, 5:44 PM

## 2015-09-24 NOTE — H&P (Signed)
[place] [facility] [place] NAME: Nolani Norberto    MR#:  [id number]  DATE OF BIRTH:  Jun 19, 1934  DATE OF ADMISSION:  09/24/2015  PRIMARY CARE PHYSICIAN: Singh,Jasmine, MD   REQUESTING/REFERRING PHYSICIAN: Quale  CHIEF COMPLAINT:  Chest tightness  HISTORY OF PRESENT ILLNESS:  Claribelle Shutter  is a 82 y.o. female with a known history of coronary artery disease status post 7 stents, left carotid artery stent and hyperlipidemia is presenting to the ED with a chief complaint of chest tightness associated with nausea. Patient reports that at around 11 AM while she was at church she started experiencing nausea associated with chest tightness. She was short of breath at that time but denies any diaphoresis. Subsequently she came into the ED, initial troponin is negative and EKG with no acute ST-T wave changes. During my examination her chest pressure is completely resolved. Patient takes aspirin and Plavix as she had multiple stents. ED physician has discussed with on-call cardiology Dr. Ubaldo Glassing who has recommended to rule out acute MI  PAST MEDICAL HISTORY:   Past Medical History  Diagnosis Date  . Hypertension   . Hiatal hernia   . CAD (coronary artery disease)     s/p PCI and stent placement of circumflex and LAD and RCA.  restenosis of RCA 2014 with drug eluting stent  . Hypercholesterolemia   . Collagen vascular disease ([place])   . Peripheral vascular disease ([place])     PAST SURGICAL HISTOIRY:   Past Surgical History  Procedure Laterality Date  . Joint replacement      knee replacement  . Peripheral vascular catheterization Left 12/19/2014    Procedure: Carotid PTA/Stent Intervention;  Surgeon: Algernon Huxley, MD;  Location: [facility];  Service: Cardiovascular;  Laterality: Left;    SOCIAL HISTORY:   Social History  Substance Use Topics  . Smoking status: Former Smoker -- 1.00 packs/day for 8 years    Quit date: 08/12/1989  .  Smokeless tobacco: Former Systems developer  . Alcohol Use: No     Comment: rarely drinks alcohol    FAMILY HISTORY:   Family History  Problem Relation Age of Onset  . Heart disease Mother   . Hypertension Brother   . Heart disease Brother 41    several MIs  . Heart disease Daughter 42    deceased from MI    DRUG ALLERGIES:   Allergies  Allergen Reactions  . Latex Rash  . Sulfa Antibiotics Itching, Swelling and Rash    REVIEW OF SYSTEMS:  CONSTITUTIONAL: No fever, fatigue or weakness.  EYES: No blurred or double vision.  EARS, NOSE, AND THROAT: No tinnitus or ear pain.  RESPIRATORY: No cough, shortness of breath, wheezing or hemoptysis.  CARDIOVASCULAR: No chest pain, orthopnea, edema.  GASTROINTESTINAL: No nausea, vomiting, diarrhea or abdominal pain.  GENITOURINARY: No dysuria, hematuria.  ENDOCRINE: No polyuria, nocturia,  HEMATOLOGY: No anemia, easy bruising or bleeding SKIN: No rash or lesion. MUSCULOSKELETAL: No joint pain or arthritis.   NEUROLOGIC: No tingling, numbness, weakness.  PSYCHIATRY: No anxiety or depression.   MEDICATIONS AT HOME:   Prior to Admission medications   Medication Sig Start Date End Date Taking? Authorizing Provider  acetaminophen (TYLENOL) 650 MG CR tablet Take 650 mg by mouth at bedtime as needed for pain.    Yes Historical Provider, MD  aspirin EC 81 MG tablet Take 81 mg by mouth daily.   Yes Historical Provider, MD  clopidogrel (PLAVIX) 75  MG tablet Take 75 mg by mouth daily.   Yes Historical Provider, MD  lisinopril (PRINIVIL,ZESTRIL) 10 MG tablet Take 10 mg by mouth daily.   Yes Historical Provider, MD  nebivolol (BYSTOLIC) 5 MG tablet Take 5 mg by mouth daily.   Yes Historical Provider, MD  potassium gluconate 595 MG TABS tablet Take 595 mg by mouth at bedtime as needed (leg cramps).   Yes Historical Provider, MD  rosuvastatin (CRESTOR) 20 MG tablet Take 20 mg by mouth at bedtime.   Yes Historical Provider, MD      VITAL SIGNS:  Blood  pressure 128/59, pulse 57, temperature 97.9 F (36.6 C), temperature source Oral, resp. rate 12, height 5\' 5"  (1.651 m), weight 72.576 kg (160 lb), SpO2 99 %.  PHYSICAL EXAMINATION:  GENERAL:  82 y.o.-year-old patient lying in the bed with no acute distress.  EYES: Pupils equal, round, reactive to light and accommodation. No scleral icterus. Extraocular muscles intact.  HEENT: Head atraumatic, normocephalic. Oropharynx and nasopharynx clear.  NECK:  Supple, no jugular venous distention. No thyroid enlargement, no tenderness.  LUNGS: Normal breath sounds bilaterally, no wheezing, rales,rhonchi or crepitation. No use of accessory muscles of respiration.  CARDIOVASCULAR: S1, S2 normal. No murmurs, rubs, or gallops. No reproducible anterior chest wall tenderness on palpation ABDOMEN: Soft, nontender, nondistended. Bowel sounds present. No organomegaly or mass.  EXTREMITIES: No pedal edema, cyanosis, or clubbing.  NEUROLOGIC: Cranial nerves II through XII are intact. Muscle strength 5/5 in all extremities. Sensation intact. Gait not checked.  PSYCHIATRIC: The patient is alert and oriented x 3.  SKIN: No obvious rash, lesion, or ulcer.   LABORATORY PANEL:   CBC  Recent Labs Lab 09/24/15 1115  WBC 5.8  HGB 14.5  HCT 43.2  PLT 180   ------------------------------------------------------------------------------------------------------------------  Chemistries   Recent Labs Lab 09/24/15 1115  NA 141  K 4.5  CL 107  CO2 25  GLUCOSE 110*  BUN 13  CREATININE 0.81  CALCIUM 9.4   ------------------------------------------------------------------------------------------------------------------  Cardiac Enzymes  Recent Labs Lab 09/24/15 1115  TROPONINI <0.03   ------------------------------------------------------------------------------------------------------------------  RADIOLOGY:  Dg Chest 2 View  09/24/2015  CLINICAL DATA:  82 year old female with right chest pain and  shortness of breath for 4 days. EXAM: CHEST  2 VIEW COMPARISON:  05/06/2006 chest radiograph FINDINGS: The cardiomediastinal silhouette is unremarkable. Coronary artery stent identified. There is no evidence of focal airspace disease, pulmonary edema, suspicious pulmonary nodule/mass, pleural effusion, or pneumothorax. No acute bony abnormalities are identified. IMPRESSION: No active cardiopulmonary disease. Electronically Signed   By: Margarette Canada M.D.   On: 09/24/2015 12:11    EKG:   Orders placed or performed during the hospital encounter of 09/24/15  . EKG 12-Lead  . EKG 12-Lead  . ED EKG within 10 minutes  . ED EKG within 10 minutes  . ED EKG  . ED EKG  . EKG 12-Lead  . EKG 12-Lead    IMPRESSION AND PLAN:   82 year old female with history of coronary artery disease status post 7 stents in the past is presenting to the ED with a chief complaint of chest pressure associated with nausea and shortness of breath.  Assessment and plan   1. Chest tightness with history of coronary artery disease status post 7 stents Admit to observation status to telemetry bed Cycle cardiac biomarkers and check fasting lipid panel in a.m. NPO  after midnight for possible stress test in a.m. ED physician has discussed with on-call cardiologist Dr. Ubaldo Glassing, he is  aware and recommended to rule out acute MI Provide oxygen, nitroglycerin as needed, aspirin, Plavix, beta blocker and statin. Get echocardiogram   2. Essential hypertension- continue home medications lisinopril and Bystolic  3. Hyperlipidemia-check fasting lipid panel and continue statin  4. History of peripheral vascular disease-no interventions needed at this time   Provide GI and DVT prophylaxis     All the records are reviewed and case discussed with ED provider. Management plans discussed with the patient, family and they are in agreement.  CODE STATUS: Full code, husband is the healthcare power of attorney  TOTAL TIME TAKING  CARE OF THIS PATIENT: 45  minutes.    Nicholes Mango M.D on 09/24/2015 at 4:20 PM  Between 7am to 6pm - Pager - [phone]  After 6pm go to [email or web address] - password [email or web address] [facility]  Office  [phone]  CC: Primary care physician; Glendon Axe, MD

## 2015-09-24 NOTE — Progress Notes (Signed)
Kelly Ritter is a 82 y.o. female patient admitted from ED awake, alert - oriented  X 4 - no acute distress noted.  VSS - Blood pressure 128/65, pulse 62, temperature 98 F (36.7 C), temperature source Oral, resp. rate 18, height 5\' 5"  (1.651 m), weight 72.576 kg (160 lb), SpO2 100 %.    IV in place, occlusive dsg intact without redness.  Orientation to room, and floor completed with information packet given to patient/family.   Admission INP armband ID verified with patient/family, and in place.   SR up x 2, fall assessment complete, with patient and family able to verbalize understanding of risk associated with falls, and verbalized understanding to call nsg before up out of bed.  Call light within reach, patient able to voice, and demonstrate understanding.  Skin, clean-dry- intact without evidence of bruising, or skin tears.   No evidence of skin break down noted on exam. Skin assessed with Carlyle Dolly. Tele box verified with Tivis Ringer., RN and Bertrum Sol, NT.      Will cont to eval and treat per MD orders.  Horton Finer, RN 09/24/2015 6:58 PM

## 2015-09-24 NOTE — ED Provider Notes (Signed)
[facility] Emergency Department Provider Note  ____________________________________________  Time seen: Approximately 3:29 PM  I have reviewed the triage vital signs and the nursing notes.   HISTORY  Chief Complaint Chest Pain    HPI Kelly Ritter is a 82 y.o. female history coronary disease and multiple stents, hypertension. Also a "TIA".  Patient presents today for evaluation of chest pressure and nausea. She reports at the church about 11:00 she started to experience nausea with chest pressure. It is persisted since that time. She reports similar symptoms requiring stents in the past. She takes aspirin and Plavix both of which she has taken today. It did radiate to her back briefly but is gone now.  No fevers chills.  Has coronary disease. Did not take nitroglycerin.   Past Medical History  Diagnosis Date  . Hypertension   . Hiatal hernia   . CAD (coronary artery disease)     s/p PCI and stent placement of circumflex and LAD and RCA.  restenosis of RCA 2014 with drug eluting stent  . Hypercholesterolemia   . Collagen vascular disease ([place])   . Peripheral vascular disease [facility])     Patient Active Problem List   Diagnosis Date Noted  . 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 CVA (cerebral infarction) 12/12/2014    Past Surgical History  Procedure Laterality Date  . Joint replacement      knee replacement  . Peripheral vascular catheterization Left 12/19/2014    Procedure: Carotid PTA/Stent Intervention;  Surgeon: Algernon Huxley, MD;  Location: [facility];  Service: Cardiovascular;  Laterality: Left;    Current Outpatient Rx  Name  Route  Sig  Dispense  Refill  . acetaminophen (TYLENOL) 650 MG CR tablet   Oral   Take 650 mg by mouth at bedtime as needed for pain.          Marland Kitchen aspirin 81 MG tablet   Oral   Take 81 mg by mouth daily.         . clopidogrel (PLAVIX) 75 MG tablet   Oral   Take 75 mg by mouth daily.          Marland Kitchen lisinopril (PRINIVIL,ZESTRIL) 10 MG tablet   Oral   Take 10 mg by mouth daily.         . nebivolol (BYSTOLIC) 5 MG tablet   Oral   Take 5 mg by mouth daily.         . potassium gluconate 595 MG TABS tablet   Oral   Take 595 mg by mouth at bedtime as needed (leg cramps).         . rosuvastatin (CRESTOR) 20 MG tablet   Oral   Take 20 mg by mouth at bedtime.           Allergies Sulfa antibiotics and Latex  Family History  Problem Relation Age of Onset  . Heart disease Mother   . Hypertension Brother   . Heart disease Brother 23    several MIs  . Heart disease Daughter 60    deceased from MI    Social History Social History  Substance Use Topics  . Smoking status: Former Smoker -- 1.00 packs/day for 8 years    Quit date: 08/12/1989  . Smokeless tobacco: Former Systems developer  . Alcohol Use: No     Comment: rarely drinks alcohol    Review of Systems Constitutional: No fever/chills Eyes: No visual changes. ENT: No sore throat. Cardiovascular: See history  of present illness  Respiratory: Denies shortness of breath. Gastrointestinal: No abdominal pain.  no vomiting.  No diarrhea.  No constipation. Genitourinary: Negative for dysuria. Musculoskeletal: Negative for back pain. Skin: Negative for rash. Neurological: Negative for headaches, focal weakness or numbness.  10-point ROS otherwise negative.  ____________________________________________   PHYSICAL EXAM:  VITAL SIGNS: ED Triage Vitals  Enc Vitals Group     BP 09/24/15 1113 140/49 mmHg     Pulse Rate 09/24/15 1113 59     Resp 09/24/15 1113 20     Temp 09/24/15 1113 97.9 F (36.6 C)     Temp Source 09/24/15 1113 Oral     SpO2 09/24/15 1113 98 %     Weight 09/24/15 1113 160 lb (72.576 kg)     Height 09/24/15 1113 5\' 5"  (1.651 m)     Head Cir --      Peak Flow --      Pain Score --      Pain Loc --      Pain Edu? --      Excl. in [place]? --    Constitutional: Alert and oriented. Well appearing and  in no acute distress. Very pleasant and amicable. Eyes: Conjunctivae are normal. PERRL. EOMI. Head: Atraumatic. Nose: No congestion/rhinnorhea. Mouth/Throat: Mucous membranes are moist.  Oropharynx non-erythematous. Neck: No stridor.   Cardiovascular: Normal rate, regular rhythm. Grossly normal heart sounds.  Good peripheral circulation. Respiratory: Normal respiratory effort.  No retractions. Lungs CTAB. Gastrointestinal: Soft and nontender. No distention. No abdominal bruits. No CVA tenderness. Musculoskeletal: No lower extremity tenderness nor edema.  No joint effusions. Neurologic:  Normal speech and language. No gross focal neurologic deficits are appreciated.Skin:  Skin is warm, dry and intact. No rash noted. Psychiatric: Mood and affect are normal. Speech and behavior are normal.  ____________________________________________   LABS (all labs ordered are listed, but only abnormal results are displayed)  Labs Reviewed  BASIC METABOLIC PANEL - Abnormal; Notable for the following:    Glucose, Bld 110 (*)    All other components within normal limits  CBC  TROPONIN I   ____________________________________________  EKG  Reviewed and interpreted by me at 12 PM EKG time 1110 Heart rate 60 PR 180 QTc 4:30 Normal sinus rhythm, no acute ischemic abnormality. Normal T waves.  Second EKG performed at 1445 Normal sinus rhythm Heart rate 65 QRS 90 QTc 450 Normal sinus rhythm, no acute ischemic T-wave abnormality. ____________________________________________  [id number]  DG Chest 2 View (Final result) Result time: 09/24/15 12:11:06   Final result by Rad Results In Interface (09/24/15 12:11:06)   Narrative:   CLINICAL DATA: 82 year old female with right chest pain and shortness of breath for 4 days.  EXAM: CHEST 2 VIEW  COMPARISON: 05/06/2006 chest radiograph  FINDINGS: The cardiomediastinal silhouette is unremarkable. Coronary artery stent identified.  There is  no evidence of focal airspace disease, pulmonary edema, suspicious pulmonary nodule/mass, pleural effusion, or pneumothorax. No acute bony abnormalities are identified.  IMPRESSION: No active cardiopulmonary disease.   Electronically Signed By: Margarette [place] M.D. On: 09/24/2015 12:11    ____________________________________________   PROCEDURES  Procedure(s) performed: None  Critical Care performed: No  ____________________________________________   INITIAL IMPRESSION / ASSESSMENT AND PLAN / ED COURSE  Pertinent labs & imaging results that were available during my care of the patient were reviewed by me and considered in my medical decision making (see chart for details).  Patient transferred chest pain. No ripping tearing or moving pain to suggest dissection.  Has a long history of coronary disease, no pleuritic pain or significant risk factors to suggest pulmonary wasn't. Chest x-ray normal. EKGs demonstrate no acute ischemic or melena first troponin is negative, however the patient's heart score is moderate risk based on her previous history and known coronary disease with multiple stents. Discussed the case and care with Dr. Bartholome Bill, after discussion we will admit the patient for chest pain observation and cardiology evaluation tomorrow. ____________________________________________   FINAL CLINICAL IMPRESSION(S) / ED DIAGNOSES  Final diagnoses:  Chest pain with moderate risk for cardiac etiology      Delman Kitten, MD 09/24/15 1550

## 2015-09-24 NOTE — ED Notes (Signed)
Pt arrives to ER via POV c/o chest heaviness to right side that started 30 minutes PTA. Pt was in Sunday school when heaviness occurred. Denies any other sx. Pt alert and oriented X4, active, cooperative, pt in NAD. RR even and unlabored, color WNL.

## 2015-09-25 ENCOUNTER — Observation Stay: Admit: 2015-09-25 | Payer: Medicare Other

## 2015-09-25 ENCOUNTER — Encounter: Payer: Self-pay | Admitting: *Deleted

## 2015-09-25 ENCOUNTER — Encounter
Admission: EM | Disposition: A | Discharge status: Home / Self Care | Payer: Self-pay | Source: Home / Self Care | Attending: Emergency Medicine

## 2015-09-25 HISTORY — PX: CARDIAC CATHETERIZATION: SHX172

## 2015-09-25 LAB — BASIC METABOLIC PANEL
ANION GAP: 7 (ref 5–15)
BUN: 15 mg/dL (ref 6–20)
CALCIUM: 9 mg/dL (ref 8.9–10.3)
CO2: 26 mmol/L (ref 22–32)
Chloride: 110 mmol/L (ref 101–111)
Creatinine, Ser: 0.79 mg/dL (ref 0.44–1.00)
GFR calc non Af Amer: >60 mL/min (ref >60)
Glucose, Bld: 124 mg/dL — ABNORMAL HIGH (ref 65–99)
POTASSIUM: 3.8 mmol/L (ref 3.5–5.1)
Sodium: 143 mmol/L (ref 135–145)

## 2015-09-25 LAB — TROPONIN I: Troponin I: 0.03 ng/mL (ref <0.031)

## 2015-09-25 LAB — LIPID PANEL
CHOL/HDL RATIO: 2.1 ratio
CHOLESTEROL: 99 mg/dL (ref 0–200)
HDL: 47 mg/dL (ref >40)
LDL Cholesterol: 32 mg/dL (ref 0–99)
TRIGLYCERIDES: 99 mg/dL (ref <150)
VLDL: 20 mg/dL (ref 0–40)

## 2015-09-25 LAB — CBC
HCT: 39.1 % (ref 35.0–47.0)
HEMOGLOBIN: 13.4 g/dL (ref 12.0–16.0)
MCH: 31.2 pg (ref 26.0–34.0)
MCHC: 34.2 g/dL (ref 32.0–36.0)
MCV: 91.2 fL (ref 80.0–100.0)
Platelets: 165 10*3/uL (ref 150–440)
RBC: 4.29 MIL/uL (ref 3.80–5.20)
RDW: 13.8 % (ref 11.5–14.5)
WBC: 6.9 10*3/uL (ref 3.6–11.0)

## 2015-09-25 LAB — TSH: TSH: 3.447 u[IU]/mL (ref 0.350–4.500)

## 2015-09-25 SURGERY — LEFT HEART CATH AND CORONARY ANGIOGRAPHY
Anesthesia: Moderate Sedation

## 2015-09-25 MED ORDER — SODIUM CHLORIDE 0.9 % IV SOLN: 250.0000 mL | INTRAVENOUS | Status: DC | PRN

## 2015-09-25 MED ORDER — SODIUM CHLORIDE 0.9% FLUSH: 3.0000 mL | INTRAVENOUS | Status: DC | PRN

## 2015-09-25 MED ORDER — ASPIRIN 81 MG PO CHEW
CHEWABLE_TABLET | ORAL | Status: AC
  Administered 2015-09-25: 81 mg
  Filled 2015-09-25: qty 1

## 2015-09-25 MED ORDER — SODIUM CHLORIDE 0.9 % WEIGHT BASED INFUSION
3.0000 mL/kg/h | INTRAVENOUS | Status: DC
Start: 2015-09-25 — End: 2015-09-25
  Administered 2015-09-25: 3 mL/kg/h via INTRAVENOUS

## 2015-09-25 MED ORDER — MIDAZOLAM HCL 2 MG/2ML IJ SOLN
INTRAMUSCULAR | Status: DC | PRN
  Administered 2015-09-25: 1 mg via INTRAVENOUS
  Administered 2015-09-25: 0.5 mg via INTRAVENOUS

## 2015-09-25 MED ORDER — HEPARIN (PORCINE) IN NACL 2-0.9 UNIT/ML-% IJ SOLN
INTRAMUSCULAR | Status: AC
  Filled 2015-09-25: qty 500

## 2015-09-25 MED ORDER — SODIUM CHLORIDE 0.9 % WEIGHT BASED INFUSION: 1.0000 mL/kg/h | INTRAVENOUS | Status: DC

## 2015-09-25 MED ORDER — SODIUM CHLORIDE 0.9 % WEIGHT BASED INFUSION: 3.0000 mL/kg/h | INTRAVENOUS | Status: DC

## 2015-09-25 MED ORDER — FENTANYL CITRATE (PF) 100 MCG/2ML IJ SOLN
INTRAMUSCULAR | Status: AC
  Filled 2015-09-25: qty 2

## 2015-09-25 MED ORDER — SODIUM CHLORIDE 0.9% FLUSH: 3.0000 mL | Freq: Two times a day (BID) | INTRAVENOUS | Status: DC

## 2015-09-25 MED ORDER — IOHEXOL 300 MG/ML  SOLN
INTRAMUSCULAR | Status: DC | PRN
  Administered 2015-09-25: 130 mL via INTRA_ARTERIAL

## 2015-09-25 MED ORDER — MIDAZOLAM HCL 2 MG/2ML IJ SOLN
INTRAMUSCULAR | Status: AC
  Filled 2015-09-25: qty 2

## 2015-09-25 MED ORDER — FENTANYL CITRATE (PF) 100 MCG/2ML IJ SOLN
INTRAMUSCULAR | Status: DC | PRN
  Administered 2015-09-25: 25 ug via INTRAVENOUS
  Administered 2015-09-25: 50 ug via INTRAVENOUS

## 2015-09-25 MED ORDER — SODIUM CHLORIDE 0.9% FLUSH
3.0000 mL | Freq: Two times a day (BID) | INTRAVENOUS | Status: DC
  Administered 2015-09-25: 3 mL via INTRAVENOUS

## 2015-09-25 MED ORDER — ASPIRIN 81 MG PO CHEW
81.0000 mg | CHEWABLE_TABLET | Freq: Once | ORAL | Status: AC
Start: 2015-09-25 — End: 2015-09-25

## 2015-09-25 SURGICAL SUPPLY — 9 items
CATH INFINITI 5FR ANG PIGTAIL (CATHETERS) ×2 IMPLANT
CATH INFINITI 5FR JL4 (CATHETERS) ×2 IMPLANT
CATH INFINITI 5FR JL5 (CATHETERS) ×2 IMPLANT
CATH INFINITI JR4 5F (CATHETERS) ×2 IMPLANT
KIT MANI 3VAL PERCEP (MISCELLANEOUS) ×2 IMPLANT
NEEDLE PERC 18GX7CM (NEEDLE) ×2 IMPLANT
PACK CARDIAC CATH (CUSTOM PROCEDURE TRAY) ×2 IMPLANT
SHEATH PINNACLE 5F 10CM (SHEATH) ×2 IMPLANT
WIRE EMERALD 3MM-J .035X150CM (WIRE) ×2 IMPLANT

## 2015-09-25 NOTE — Discharge Instructions (Signed)

## 2015-09-25 NOTE — Progress Notes (Signed)
Cardiac cath completed this am with no immediate complications. Cath revealed patent stent in rca, lad and lcx with insignificant disease elsewhere. Continue current meds. Ambulate and consider discharge with outpatient follow up with Dr. Nehemiah Massed in 1 week.

## 2015-09-25 NOTE — Consult Note (Signed)
[facility]  CARDIOLOGY CONSULT NOTE  Patient ID: Kelly Ritter MRN: [id number] DOB/AGE: 1934-08-09 82 y.o.  Admit date: 09/24/2015 Referring Physician Dr. Posey Pronto Primary Physician   Primary Cardiologist Dr. Nehemiah Massed Reason for Consultation [place]  HPI: Pt is a 82 yo female with history of cad s/p pci on multiple occasions with stents in lad, lcx and rca with restenosis of rca stent requiring restenting. She has been compliant with her meds including dual antiplatelet therapy. She presented to the er with an episode of chest pain which occurred while in church. She described it as chest heaviness with radiation to her back. She often has palpitations with her angina but did not have it with this episode. EKG showed nonspecific abnormalities. She has ruled out for an mi. She states these symptoms have been present with her angina in the past.   Review of Systems  Constitutional: Positive for malaise/fatigue.  HENT: Negative.   Eyes: Negative.   Respiratory: Negative.   Cardiovascular: Positive for chest pain.  Gastrointestinal: Negative.   Genitourinary: Negative.   Musculoskeletal: Negative.   Skin: Negative.   Neurological: Negative.   Endo/Heme/Allergies: Negative.   Psychiatric/Behavioral: Negative.    Review of Systems - History obtained from chart review and the patient General ROS: positive for  - chest tightness Respiratory ROS: no cough, shortness of breath, or wheezing Cardiovascular ROS: positive for - chest pain Gastrointestinal ROS: no abdominal pain, change in bowel habits, or black or bloody stools Neurological ROS: no TIA or stroke symptoms   Past Medical History  Diagnosis Date  . Hypertension   . Hiatal hernia   . CAD (coronary artery disease)     s/p PCI and stent placement of circumflex and LAD and RCA.  restenosis of RCA 2014 with drug eluting stent  . Hypercholesterolemia   . Collagen vascular disease ([place])   .  Peripheral vascular disease ([place])     Family History  Problem Relation Age of Onset  . Heart disease Mother   . Hypertension Brother   . Heart disease Brother 73    several MIs  . Heart disease Daughter 54    deceased from MI    Social History   Social History  . Marital Status: Married    Spouse Name: N/A  . Number of Children: N/A  . Years of Education: N/A   Occupational History  . Not on file.   Social History Main Topics  . Smoking status: Former Smoker -- 1.00 packs/day for 8 years    Quit date: 08/12/1989  . Smokeless tobacco: Former Systems developer  . Alcohol Use: No     Comment: rarely drinks alcohol  . Drug Use: No  . Sexual Activity: Yes    Birth Control/ Protection: Post-menopausal   Other Topics Concern  . Not on file   Social History Narrative    Past Surgical History  Procedure Laterality Date  . Joint replacement      knee replacement  . Peripheral vascular catheterization Left 12/19/2014    Procedure: Carotid PTA/Stent Intervention;  Surgeon: Algernon Huxley, MD;  Location: [facility];  Service: Cardiovascular;  Laterality: Left;     Prescriptions prior to admission  Medication Sig Dispense Refill Last Dose  . acetaminophen (TYLENOL) 650 MG CR tablet Take 650 mg by mouth at bedtime as needed for pain.    09/23/2015 at Unknown time  . aspirin EC 81 MG tablet Take 81 mg by mouth daily.  09/24/2015 at Unknown time  . clopidogrel (PLAVIX) 75 MG tablet Take 75 mg by mouth daily.   09/23/2015 at Unknown time  . lisinopril (PRINIVIL,ZESTRIL) 10 MG tablet Take 10 mg by mouth daily.   09/23/2015 at Unknown time  . nebivolol (BYSTOLIC) 5 MG tablet Take 5 mg by mouth daily.   09/23/2015 at 0900  . potassium gluconate 595 MG TABS tablet Take 595 mg by mouth at bedtime as needed (leg cramps).   09/23/2015 at Unknown time  . rosuvastatin (CRESTOR) 20 MG tablet Take 20 mg by mouth at bedtime.   09/23/2015 at Unknown time    Physical Exam: Blood pressure 136/73, pulse 98,  temperature 97.5 F (36.4 C), temperature source Oral, resp. rate 18, height 5\' 5"  (1.651 m), weight 72.576 kg (160 lb), SpO2 93 %.    General appearance: alert and cooperative Resp: clear to auscultation bilaterally Chest wall: no tenderness Cardio: regular rate and rhythm, S1, S2 normal, no murmur, click, rub or gallop GI: soft, non-tender; bowel sounds normal; no masses,  no organomegaly Extremities: extremities normal, atraumatic, no cyanosis or edema Neurologic: Grossly normal Labs:   Lab Results  Component Value Date   WBC 6.9 09/25/2015   HGB 13.4 09/25/2015   HCT 39.1 09/25/2015   MCV 91.2 09/25/2015   PLT 165 09/25/2015    Recent Labs Lab 09/25/15 0112  NA 143  K 3.8  CL 110  CO2 26  BUN 15  CREATININE 0.79  CALCIUM 9.0  GLUCOSE 124*   Lab Results  Component Value Date   TROPONINI <0.03 09/25/2015      Radiology: no acute airspace disease EKG: nsr with nonspecific st t wave changes.   ASSESSMENT AND PLAN:  82 yo with history of cad with multiple stents who was admitted after develping rest chest pain while in church. Has ruled out for an mi and ekg without significant abnormalities. Symptoms have some similarity to her angina. She also has noted increased weakness and fatigue recently. After discussion with patient given her progressive fatigue and chest tightness at rest, will proceed withleft cardiac cath later today with further recs after cath. Continue current meds for now.  Signed: Teodoro Spray MD, [facility] 09/25/2015, 7:27 AM

## 2015-09-25 NOTE — Progress Notes (Signed)
Patient ambulated halfway around nursing station.  No shortness of breath or chest tightness,  VSS, NSR on monitor.  No hematoma at groin site.    Patient given vascular discharge instructions and able to repeat back information about activity restrictions.  No new medications, f/u appt given to patient.    Telemetry and PIVs removed.  Patient to be escorted out of hospital via wheelchair by volunteers.

## 2015-09-25 NOTE — Discharge Summary (Signed)
Kelly Ritter, 82 y.o., DOB 07-25-1934, MRN [id number]. Admission date: 09/24/2015 Discharge Date 09/25/2015 Primary MD Glendon Axe, MD Admitting Physician Nicholes Mango, MD  Admission Diagnosis  Chest pain with moderate risk for cardiac etiology [R07.9]  Discharge Diagnosis   Active Problems:   Chest pain noncardiac, musculoskeletal status post catheter with patent stents Coronary artery disease with multiple stents in the past Hypertension essential Hiatal hernia Hyperlipidemia Collagen vascular disease Peripheral vascular disease         Hospital Course  Kelly Ritter is a 82 y.o. female with a known history of coronary artery disease status post 7 stents, left carotid artery stent and hyperlipidemia presented to the ED with complaint of chest pressure. Patient's EKG and cardiac enzymes were negative. She was admitted to the hospital cardiology evaluation was done and underwent a cardiac catheter which shows patent stents. She is currently asymptomatic and was cleared by cardiology for discharge.       Consults  cardiology  Significant Tests:  See full reports for all details    Dg Chest 2 View  09/24/2015  CLINICAL DATA:  82 year old female with right chest pain and shortness of breath for 4 days. EXAM: CHEST  2 VIEW COMPARISON:  05/06/2006 chest radiograph FINDINGS: The cardiomediastinal silhouette is unremarkable. Coronary artery stent identified. There is no evidence of focal airspace disease, pulmonary edema, suspicious pulmonary nodule/mass, pleural effusion, or pneumothorax. No acute bony abnormalities are identified. IMPRESSION: No active cardiopulmonary disease. Electronically Signed   By: Margarette [place] M.D.   On: 09/24/2015 12:11       Today   Subjective:   Kelly Ritter  feels better denies any chest pain or shortness of breath   Objective:   Blood pressure 120/40, pulse 61, temperature 97.5 F (36.4 C), temperature source Oral, resp. rate 19, height 5\' 5"   (1.651 m), weight 72.576 kg (160 lb), SpO2 97 %.  .  Intake/Output Summary (Last 24 hours) at 09/25/15 1324 Last data filed at 09/25/15 0418  Gross per 24 hour  Intake      0 ml  Output    750 ml  Net   -750 ml    Exam VITAL SIGNS: Blood pressure 120/40, pulse 61, temperature 97.5 F (36.4 C), temperature source Oral, resp. rate 19, height 5\' 5"  (1.651 m), weight 72.576 kg (160 lb), SpO2 97 %.  GENERAL:  82 y.o.-year-old patient lying in the bed with no acute distress.  EYES: Pupils equal, round, reactive to light and accommodation. No scleral icterus. Extraocular muscles intact.  HEENT: Head atraumatic, normocephalic. Oropharynx and nasopharynx clear.  NECK:  Supple, no jugular venous distention. No thyroid enlargement, no tenderness.  LUNGS: Normal breath sounds bilaterally, no wheezing, rales,rhonchi or crepitation. No use of accessory muscles of respiration.  CARDIOVASCULAR: S1, S2 normal. No murmurs, rubs, or gallops.  ABDOMEN: Soft, nontender, nondistended. Bowel sounds present. No organomegaly or mass.  EXTREMITIES: No pedal edema, cyanosis, or clubbing.  NEUROLOGIC: Cranial nerves II through XII are intact. Muscle strength 5/5 in all extremities. Sensation intact. Gait not checked.  PSYCHIATRIC: The patient is alert and oriented x 3.  SKIN: No obvious rash, lesion, or ulcer.   Data Review     CBC w Diff: Lab Results  Component Value Date   WBC 6.9 09/25/2015   WBC 6.1 09/28/2012   HGB 13.4 09/25/2015   HCT 39.1 09/25/2015   PLT 165 09/25/2015   LYMPHOPCT 22% 12/12/2014   MONOPCT 7% 12/12/2014   EOSPCT 2% 12/12/2014  BASOPCT 1% 12/12/2014   CMP: Lab Results  Component Value Date   NA 143 09/25/2015   NA 140 10/01/2012   K 3.8 09/25/2015   K 4.3 10/01/2012   CL 110 09/25/2015   CL 109* 10/01/2012   CO2 26 09/25/2015   CO2 23 10/01/2012   BUN 15 09/25/2015   BUN 16 10/01/2012   CREATININE 0.79 09/25/2015   CREATININE 0.63 10/01/2012   PROT 7.5 12/12/2014    ALBUMIN 4.5 12/12/2014   BILITOT 0.8 12/12/2014   ALKPHOS 87 12/12/2014   AST 20 12/12/2014   ALT 17 12/12/2014  .  Micro Results No results found for this or any previous visit (from the past 240 hour(s)).      Code Status Orders        Start     Ordered   09/24/15 1829  Full code   Continuous     09/24/15 1828    Code Status History    Date Active Date Inactive Code Status Order ID Comments User Context   12/19/2014  3:23 PM 12/20/2014  3:07 PM Full Code [id number]  Kelly Huxley, MD Inpatient   12/12/2014 12:54 PM 12/13/2014  8:04 PM Full Code [id number]  Kelly Costa, MD Inpatient    Advance Directive Documentation        Most Recent Value   Type of Advance Directive  Healthcare Power of [place]   Pre-existing out of facility DNR order (yellow form or pink MOST form)     "MOST" Form in Place?            Follow-up Information    Follow up with Singh,Jasmine, MD. Go on 10/03/2015.   Specialty:  Internal Medicine   Why:  at 3:00pm.    Contact information:   [facility] [address] [place] [address] [phone]       Discharge Medications     Medication List    TAKE these medications        acetaminophen 650 MG CR tablet  Commonly known as:  TYLENOL  Take 650 mg by mouth at bedtime as needed for pain.     aspirin EC 81 MG tablet  Take 81 mg by mouth daily.     clopidogrel 75 MG tablet  Commonly known as:  PLAVIX  Take 75 mg by mouth daily.     lisinopril 10 MG tablet  Commonly known as:  PRINIVIL,ZESTRIL  Take 10 mg by mouth daily.     nebivolol 5 MG tablet  Commonly known as:  BYSTOLIC  Take 5 mg by mouth daily.     potassium gluconate 595 (99 K) MG Tabs tablet  Take 595 mg by mouth at bedtime as needed (leg cramps).     rosuvastatin 20 MG tablet  Commonly known as:  CRESTOR  Take 20 mg by mouth at bedtime.           Total Time in preparing paper work, data evaluation and todays exam - 35 minutes  Dustin Flock  M.D on 09/25/2015 at 1:24 PM  [facility]   Office  [phone]

## 2015-09-25 NOTE — Progress Notes (Signed)
Patient s/p cath.  She is alert and oriented, NSR on monitor.  VSS.  Patient sat up at 1330 and ambulated to bathroom with standby assist.  No dizziness, chest pain, or shortness of breath noted.  Output: 2107mL.  No hematoma at groin site (LFA)  Food ordered and patient tolerating well, no nausea.    Husband at bedside.

## 2015-12-18 DIAGNOSIS — M2550 Pain in unspecified joint: Secondary | ICD-10-CM | POA: Insufficient documentation

## 2015-12-18 DIAGNOSIS — M79641 Pain in right hand: Secondary | ICD-10-CM

## 2015-12-18 DIAGNOSIS — G8929 Other chronic pain: Secondary | ICD-10-CM | POA: Insufficient documentation

## 2015-12-18 DIAGNOSIS — Z79899 Other long term (current) drug therapy: Secondary | ICD-10-CM | POA: Insufficient documentation

## 2015-12-18 DIAGNOSIS — G9050 Complex regional pain syndrome I, unspecified: Secondary | ICD-10-CM | POA: Insufficient documentation

## 2016-01-09 DIAGNOSIS — M19049 Primary osteoarthritis, unspecified hand: Secondary | ICD-10-CM | POA: Insufficient documentation

## 2016-03-14 ENCOUNTER — Encounter: Payer: Self-pay | Admitting: *Deleted

## 2016-03-14 ENCOUNTER — Ambulatory Visit
Admission: EM | Admit: 2016-03-14 | Discharge: 2016-03-14 | Payer: Medicare Other | Attending: Family Medicine | Admitting: Family Medicine

## 2016-03-14 DIAGNOSIS — S0990XA Unspecified injury of head, initial encounter: Secondary | ICD-10-CM | POA: Diagnosis not present

## 2016-03-14 DIAGNOSIS — M542 Cervicalgia: Secondary | ICD-10-CM

## 2016-03-14 DIAGNOSIS — S060X0A Concussion without loss of consciousness, initial encounter: Secondary | ICD-10-CM
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
## 2016-10-22 DIAGNOSIS — R3129 Other microscopic hematuria: Secondary | ICD-10-CM

## 2016-10-22 DIAGNOSIS — E854 Organ-limited amyloidosis: Secondary | ICD-10-CM | POA: Insufficient documentation

## 2016-10-22 DIAGNOSIS — R7989 Other specified abnormal findings of blood chemistry: Secondary | ICD-10-CM | POA: Insufficient documentation

## 2016-11-20 ENCOUNTER — Ambulatory Visit
Admission: EM | Admit: 2016-11-20 | Discharge: 2016-11-20 | Disposition: A | Discharge status: Home / Self Care | Payer: Medicare Other | Attending: Family Medicine | Admitting: Family Medicine

## 2016-11-20 ENCOUNTER — Encounter: Payer: Self-pay | Admitting: *Deleted

## 2016-11-20 DIAGNOSIS — N390 Urinary tract infection, site not specified: Secondary | ICD-10-CM | POA: Diagnosis not present

## 2016-11-20 DIAGNOSIS — R319 Hematuria, unspecified: Secondary | ICD-10-CM

## 2016-11-20 LAB — URINALYSIS, COMPLETE (UACMP) WITH MICROSCOPIC
GLUCOSE, UA: NEGATIVE mg/dL
Ketones, ur: 15 mg/dL — AB
Nitrite: NEGATIVE
PH: 5.5 (ref 5.0–8.0)
Protein, ur: 100 mg/dL — AB
Squamous Epithelial / LPF: NONE SEEN

## 2016-11-20 MED ORDER — NITROFURANTOIN MONOHYD MACRO 100 MG PO CAPS: 100.0000 mg | ORAL_CAPSULE | Freq: Two times a day (BID) | ORAL | 0 refills | Status: DC

## 2016-11-20 NOTE — ED Provider Notes (Signed)
[facility]    CSN: [id number] Arrival date & time: 11/20/16  1757     History   Chief Complaint Chief Complaint  Patient presents with  . Dysuria  . Urinary Frequency    HPI Kelly Ritter is a 83 y.o. female.   The history is provided by the patient.  Dysuria  Pain quality:  Burning Pain severity:  Moderate Onset quality:  Sudden Duration:  6 hours Timing:  Constant Progression:  Worsening Chronicity:  New Recent urinary tract infections: no   Relieved by:  None tried Urinary symptoms: frequent urination and hesitancy   Urinary symptoms: no discolored urine, no foul-smelling urine and no hematuria   Associated symptoms: no abdominal pain, no fever, no flank pain, no genital lesions, no nausea and no vaginal discharge   Risk factors: no hx of pyelonephritis, no hx of urolithiasis, no kidney transplant, not pregnant, no recurrent urinary tract infections, no renal cysts, no renal disease, no single kidney and no urinary catheter     Past Medical History:  Diagnosis Date  . CAD (coronary artery disease)    s/p PCI and stent placement of circumflex and LAD and RCA.  restenosis of RCA 2014 with drug eluting stent  . Collagen vascular disease ([place])   . Hiatal hernia   . Hypercholesterolemia   . Hypertension   . Peripheral vascular disease [facility])     Patient Active Problem List   Diagnosis Date Noted  . Chest pain 09/24/2015  . 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 CVA (cerebral infarction) 12/12/2014    Past Surgical History:  Procedure Laterality Date  . CARDIAC CATHETERIZATION N/A 09/25/2015   Procedure: Left Heart Cath and Coronary Angiography;  Surgeon: Teodoro Spray, MD;  Location: [place] [facility];  Service: Cardiovascular;  Laterality: N/A;  . CAROTID STENT Left   . JOINT REPLACEMENT     knee replacement  . PERIPHERAL VASCULAR CATHETERIZATION Left 12/19/2014   Procedure: Carotid PTA/Stent Intervention;  Surgeon: Algernon Huxley, MD;   Location: [place] [facility];  Service: Cardiovascular;  Laterality: Left;    OB History    No data available       Home Medications    Prior to Admission medications   Medication Sig Start Date End Date Taking? Authorizing Provider  acetaminophen (TYLENOL) 650 MG CR tablet Take 650 mg by mouth at bedtime as needed for pain.    Yes Historical Provider, MD  aspirin EC 81 MG tablet Take 81 mg by mouth daily.   Yes Historical Provider, MD  clopidogrel (PLAVIX) 75 MG tablet Take 75 mg by mouth daily.   Yes Historical Provider, MD  lisinopril (PRINIVIL,ZESTRIL) 10 MG tablet Take 10 mg by mouth daily.   Yes Historical Provider, MD  nebivolol (BYSTOLIC) 5 MG tablet Take 5 mg by mouth daily.   Yes Historical Provider, MD  potassium gluconate 595 MG TABS tablet Take 595 mg by mouth at bedtime as needed (leg cramps).   Yes Historical Provider, MD  rosuvastatin (CRESTOR) 20 MG tablet Take 20 mg by mouth at bedtime.   Yes Historical Provider, MD  cephALEXin (KEFLEX) 500 MG capsule Take 500 mg by mouth 3 (three) times daily.    Historical Provider, MD  clindamycin (CLEOCIN) 300 MG capsule Take 300 mg by mouth 3 (three) times daily.    Historical Provider, MD  nitrofurantoin, macrocrystal-monohydrate, (MACROBID) 100 MG capsule Take 1 capsule (100 mg total) by mouth 2 (two) times daily. 11/20/16  Norval Gable, MD    Family History Family History  Problem Relation Age of Onset  . Heart disease Mother   . Hypertension Brother   . Heart disease Brother 24    several MIs  . Heart disease Daughter 51    deceased from MI    Social History Social History  Substance Use Topics  . Smoking status: Former Smoker    Packs/day: 1.00    Years: 8.00    Quit date: 08/12/1989  . Smokeless tobacco: Former Systems developer  . Alcohol use No     Comment: rarely drinks alcohol     Allergies   Latex and Sulfa antibiotics   Review of Systems Review of Systems  Constitutional: Negative for fever.    Gastrointestinal: Negative for abdominal pain and nausea.  Genitourinary: Positive for dysuria. Negative for flank pain and vaginal discharge.     Physical Exam Triage Vital Signs ED Triage Vitals  Enc Vitals Group     BP 11/20/16 1810 (!) 152/64     Pulse Rate 11/20/16 1810 (!) 102     Resp 11/20/16 1810 16     Temp 11/20/16 1810 98.3 F (36.8 C)     Temp Source 11/20/16 1810 Oral     SpO2 11/20/16 1810 98 %     Weight 11/20/16 1812 167 lb (75.8 kg)     Height 11/20/16 1812 5\' 5"  (1.651 m)     Head Circumference --      Peak Flow --      Pain Score --      Pain Loc --      Pain Edu? --      Excl. in [place]? --    No data found.   Updated Vital Signs BP (!) 152/64 (BP Location: Left Arm)   Pulse (!) 102   Temp 98.3 F (36.8 C) (Oral)   Resp 16   Ht 5\' 5"  (1.651 m)   Wt 167 lb (75.8 kg)   SpO2 98%   BMI 27.79 kg/m   Visual Acuity Right Eye Distance:   Left Eye Distance:   Bilateral Distance:    Right Eye Near:   Left Eye Near:    Bilateral Near:     Physical Exam  Constitutional: She appears well-developed and well-nourished. No distress.  Abdominal: Soft. Bowel sounds are normal. She exhibits no distension and no mass. There is tenderness (minimal; mild suprapubic). There is no rebound and no guarding.  Skin: She is not diaphoretic.  Nursing note and vitals reviewed.    UC Treatments / Results  Labs (all labs ordered are listed, but only abnormal results are displayed) Labs Reviewed  URINALYSIS, COMPLETE (UACMP) WITH MICROSCOPIC - Abnormal; Notable for the following:       Result Value   APPearance CLOUDY (*)    Specific Gravity, Urine >1.030 (*)    Hgb urine dipstick LARGE (*)    Bilirubin Urine SMALL (*)    Ketones, ur 15 (*)    Protein, ur 100 (*)    Leukocytes, UA MODERATE (*)    Bacteria, UA MANY (*)    All other components within normal limits    EKG  EKG Interpretation None       Radiology No results  found.  Procedures Procedures (including critical care time)  Medications Ordered in UC Medications - No data to display   Initial Impression / Assessment and Plan / UC Course  I have reviewed the triage vital signs and the nursing  notes.  Pertinent labs & imaging results that were available during my care of the patient were reviewed by me and considered in my medical decision making (see chart for details).       Final Clinical Impressions(s) / UC Diagnoses   Final diagnoses:  Urinary tract infection with hematuria, site unspecified    New Prescriptions New Prescriptions   NITROFURANTOIN, MACROCRYSTAL-MONOHYDRATE, (MACROBID) 100 MG CAPSULE    Take 1 capsule (100 mg total) by mouth 2 (two) times daily.   1. Lab results and diagnosis reviewed with patient 2. rx as per orders above; reviewed possible side effects, interactions, risks and benefits  3. Recommend supportive treatment with increased water intake 4. Follow-up prn if symptoms worsen or don't improve   Norval Gable, MD 11/20/16 1933

## 2016-11-20 NOTE — ED Triage Notes (Signed)
Dysuria, urinary freq/urg, onset today. Denies other symptoms.

## 2016-12-13 ENCOUNTER — Ambulatory Visit
Admission: EM | Admit: 2016-12-13 | Discharge: 2016-12-13 | Disposition: A | Discharge status: Home / Self Care | Payer: Medicare Other | Attending: Family Medicine | Admitting: Family Medicine

## 2016-12-13 ENCOUNTER — Encounter: Payer: Self-pay | Admitting: Gynecology

## 2016-12-13 DIAGNOSIS — T148XXA Other injury of unspecified body region, initial encounter: Secondary | ICD-10-CM | POA: Diagnosis not present

## 2016-12-13 DIAGNOSIS — N3001 Acute cystitis with hematuria: Secondary | ICD-10-CM | POA: Diagnosis not present

## 2016-12-13 DIAGNOSIS — S8011XA Contusion of right lower leg, initial encounter: Secondary | ICD-10-CM

## 2016-12-13 DIAGNOSIS — N390 Urinary tract infection, site not specified: Secondary | ICD-10-CM

## 2016-12-13 LAB — URINALYSIS, COMPLETE (UACMP) WITH MICROSCOPIC
Bilirubin Urine: NEGATIVE
Glucose, UA: NEGATIVE mg/dL
Ketones, ur: NEGATIVE mg/dL
Nitrite: NEGATIVE
PROTEIN: NEGATIVE mg/dL
SPECIFIC GRAVITY, URINE: 1.025 (ref 1.005–1.030)
pH: 6.5 (ref 5.0–8.0)

## 2016-12-13 MED ORDER — MUPIROCIN 2 % EX OINT: 1.0000 "application " | TOPICAL_OINTMENT | Freq: Two times a day (BID) | CUTANEOUS | 0 refills | Status: DC

## 2016-12-13 MED ORDER — CEFDINIR 300 MG PO CAPS: 300.0000 mg | ORAL_CAPSULE | Freq: Two times a day (BID) | ORAL | 0 refills | Status: DC

## 2016-12-13 NOTE — ED Triage Notes (Signed)
Per patient meat from freezer at her home fallen on her right leg x today. Per patient burning with urination.

## 2016-12-13 NOTE — Discharge Instructions (Signed)
Would recommend using ice  instead of for the first 2 days for the first 3-4 days before using heat on the hematoma.  If the hematoma rupture I would go to the ED for evaluation and possible treatment
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
## 2016-12-15 ENCOUNTER — Telehealth: Payer: Self-pay

## 2016-12-15 ENCOUNTER — Ambulatory Visit
Admission: EM | Admit: 2016-12-15 | Discharge: 2016-12-15 | Disposition: A | Discharge status: Home / Self Care | Payer: Medicare Other | Attending: Emergency Medicine | Admitting: Emergency Medicine

## 2016-12-15 DIAGNOSIS — R350 Frequency of micturition: Secondary | ICD-10-CM

## 2016-12-15 DIAGNOSIS — B3731 Acute candidiasis of vulva and vagina: Secondary | ICD-10-CM

## 2016-12-15 DIAGNOSIS — B373 Candidiasis of vulva and vagina: Secondary | ICD-10-CM

## 2016-12-15 LAB — URINALYSIS, COMPLETE (UACMP) WITH MICROSCOPIC
BILIRUBIN URINE: NEGATIVE
Bacteria, UA: NONE SEEN
GLUCOSE, UA: NEGATIVE mg/dL
HGB URINE DIPSTICK: NEGATIVE
Ketones, ur: NEGATIVE mg/dL
LEUKOCYTES UA: NEGATIVE
NITRITE: NEGATIVE
Protein, ur: NEGATIVE mg/dL
RBC / HPF: NONE SEEN RBC/hpf (ref 0–5)
Specific Gravity, Urine: 1.015 (ref 1.005–1.030)
pH: 6 (ref 5.0–8.0)

## 2016-12-15 LAB — URINE CULTURE: Special Requests: NORMAL

## 2016-12-15 MED ORDER — FLUCONAZOLE 150 MG PO TABS: 150.0000 mg | ORAL_TABLET | Freq: Once | ORAL | 1 refills | Status: AC

## 2016-12-15 MED ORDER — MICONAZOLE NITRATE 2 % EX CREA: 1.0000 "application " | TOPICAL_CREAM | Freq: Two times a day (BID) | CUTANEOUS | 0 refills | Status: DC

## 2016-12-15 NOTE — Discharge Instructions (Signed)
Finish the [employer]. We will call you if we need to change anything. Follow-up with Dr. Erlene Quan, urology. Go to the ER if you have fevers above 100.4, abdominal pain, back pain or for any concerns. The hematoma your leg is going to take some time to resolve, but it should resorb on its own. Continue the local wound care as you've been doing.  Here is a list of primary care providers who are taking new patients:  Dr. Otilio Miu. Dr. Adline Potter [address] [place] [address] [phone]  Dr. Thersa Salt [address]  [place] [address]  [phone]  [place] [address]  [phone]

## 2016-12-15 NOTE — ED Triage Notes (Signed)
Pt being treated for UTI but no better and having burning with urination. Was called today and told her urine cx was inconclusive and came back to provide a new sample.

## 2016-12-15 NOTE — ED Provider Notes (Signed)
HPI  SUBJECTIVE:  Kelly Ritter is a 83 y.o. female who presents with persistent urinary urgency, frequency, states that she urinates a little at a time and feels that she has difficulty emptying her bladder. This is been going on since April. She was seen here on 4/11 for UTI, started on Macrobid and Keflex, but she states that the urgency frequency and difficulty in completely emptying bladder never cleared up. She was again seen here on 5/4 for the same, and was started on a third generation cephalosporin/Omnicef. Urine culture came back contaminated. she came back here to give [place] a repeat urine sample and for repeat urine culture. She does report 2 days of vaginal itching and burning. This is new since starting the [facility]. She says the dysuria has cleared. She denies cloudy or odorous urine, hematuria. No vaginal odor, discharge, genital swelling or rash. No fevers, nausea, vomiting, abdominal, back or pelvic pain. No diarrhea, urinary incontinence. She states that she has not been sexually active in over 10 years. She has tried [place] and pushing fluids there are no other aggravating or alleviating factors. She has a past medical history of coronary artery disease status post stent placements, collagen vascular disease, peripheral vascular disease, hypertension, UTI. No history of pyelonephritis, nephrolithiasis. Has a past medical story skin cancers. She is a former smoker. No history of diabetes, STDs, BV. She has a history of vaginal yeast infections. PMD: Dr. Candiss Norse.   Past Medical History:  Diagnosis Date  . CAD (coronary artery disease)    s/p PCI and stent placement of circumflex and LAD and RCA.  restenosis of RCA 2014 with drug eluting stent  . Collagen vascular disease ([place])   . Hiatal hernia   . Hypercholesterolemia   . Hypertension   . Peripheral vascular disease [facility])     Past Surgical History:  Procedure Laterality Date  . CARDIAC CATHETERIZATION N/A 09/25/2015   Procedure:  Left Heart Cath and Coronary Angiography;  Surgeon: Teodoro Spray, MD;  Location: [place] [facility];  Service: Cardiovascular;  Laterality: N/A;  . CAROTID STENT Left   . JOINT REPLACEMENT     knee replacement  . PERIPHERAL VASCULAR CATHETERIZATION Left 12/19/2014   Procedure: Carotid PTA/Stent Intervention;  Surgeon: Algernon Huxley, MD;  Location: [place] [facility];  Service: Cardiovascular;  Laterality: Left;    Family History  Problem Relation Age of Onset  . Heart disease Mother   . Hypertension Brother   . Heart disease Brother 50    several MIs  . Heart disease Daughter 27    deceased from MI    Social History  Substance Use Topics  . Smoking status: Former Smoker    Packs/day: 1.00    Years: 8.00    Quit date: 08/12/1989  . Smokeless tobacco: Former Systems developer  . Alcohol use No     Comment: rarely drinks alcohol    No current facility-administered medications for this encounter.   Current Outpatient Prescriptions:  .  acetaminophen (TYLENOL) 650 MG CR tablet, Take 650 mg by mouth at bedtime as needed for pain. , Disp: , Rfl:  .  aspirin EC 81 MG tablet, Take 81 mg by mouth daily., Disp: , Rfl:  .  cefdinir (OMNICEF) 300 MG capsule, Take 1 capsule (300 mg total) by mouth 2 (two) times daily., Disp: 20 capsule, Rfl: 0 .  clopidogrel (PLAVIX) 75 MG tablet, Take 75 mg by mouth daily., Disp: , Rfl:  .  fluconazole (DIFLUCAN) 150  MG tablet, Take 1 tablet (150 mg total) by mouth once. 1 tab po x 1. May repeat in 72 hours if no improvement, Disp: 2 tablet, Rfl: 1 .  lisinopril (PRINIVIL,ZESTRIL) 10 MG tablet, Take 10 mg by mouth daily., Disp: , Rfl:  .  miconazole (MICOTIN) 2 % cream, Apply 1 application topically 2 (two) times daily., Disp: 28.35 g, Rfl: 0 .  mupirocin ointment (BACTROBAN) 2 %, Apply 1 application topically 2 (two) times daily., Disp: 22 g, Rfl: 0 .  nebivolol (BYSTOLIC) 5 MG tablet, Take 5 mg by mouth daily., Disp: , Rfl:  .  potassium gluconate 595 MG TABS  tablet, Take 595 mg by mouth at bedtime as needed (leg cramps)., Disp: , Rfl:  .  rosuvastatin (CRESTOR) 20 MG tablet, Take 20 mg by mouth at bedtime., Disp: , Rfl:   Allergies  Allergen Reactions  . Latex Rash  . Sulfa Antibiotics Itching, Swelling and Rash     ROS  As noted in HPI.   Physical Exam  BP (!) 156/55 (BP Location: Left Arm)   Pulse 72   Temp 98.5 F (36.9 C) (Oral)   Resp 18   Ht 5\' 5"  (1.651 m)   Wt 167 lb (75.8 kg)   SpO2 98%   BMI 27.79 kg/m   Constitutional: Well developed, well nourished, no acute distress Eyes:  EOMI, conjunctiva normal bilaterally HENT: Normocephalic, atraumatic,mucus membranes moist Respiratory: Normal inspiratory effort Cardiovascular: Normal rate GI: nondistended. Soft, nontender. No palpable masses. No flank tenderness. Back: No CVA tenderness skin: No rash, skin intact Musculoskeletal: no deformities Neurologic: Alert & oriented x 3, no focal neuro deficits Psychiatric: Speech and behavior appropriate   ED Course   Medications - No data to display  Orders Placed This Encounter  Procedures  . Urine culture    Standing Status:   Standing    Number of Occurrences:   1    Order Specific Question:   List patient's active antibiotics    Answer:   [email or web address]    Order Specific Question:   Patient immune status    Answer:   Normal  . Urinalysis, Complete w Microscopic    Standing Status:   Standing    Number of Occurrences:   1    Results for orders placed or performed during the hospital encounter of 12/15/16 (from the past 24 hour(s))  Urinalysis, Complete w Microscopic     Status: Abnormal   Collection Time: 12/15/16  2:49 PM  Result Value Ref Range   Color, Urine STRAW (A) YELLOW   APPearance CLEAR CLEAR   Specific Gravity, Urine 1.015 1.005 - 1.030   pH 6.0 5.0 - 8.0   Glucose, UA NEGATIVE NEGATIVE mg/dL   Hgb urine dipstick NEGATIVE NEGATIVE   Bilirubin Urine NEGATIVE NEGATIVE   Ketones, ur NEGATIVE NEGATIVE  mg/dL   Protein, ur NEGATIVE NEGATIVE mg/dL   Nitrite NEGATIVE NEGATIVE   Leukocytes, UA NEGATIVE NEGATIVE   Squamous Epithelial / LPF 0-5 (A) NONE SEEN   WBC, UA 0-5 0 - 5 WBC/hpf   RBC / HPF NONE SEEN 0 - 5 RBC/hpf   Bacteria, UA NONE SEEN NONE SEEN   No results found.  ED Clinical Impression  Urinary frequency  Yeast vaginitis   ED Assessment/Plan  Previous records, labs reviewed. As noted in history of present illness.   Urinalysis with some squamous epithelial cells, negative for UTI. The hematuria has cleared. However we'll send off for culture due to patient's and  family request although discussed with her that I anticipated that this would be negative. She will finish the [facility], continue pushing fluids. I believe that her new symptoms are mostly from a yeast infection from the antibiotics. Deferred pelvic today. will send her home with Diflucan and topical yeast cream. We'll refer her to Dr. Erlene Quan, urology on-call as patient is still reporting some urgency, frequency and difficulty completely emptying her bladder. She does not report any incontinence nor did she have any palpable bladder. We'll also provide a primary care referral as patient is wanting to find a new primary care physician.  Discussed labs, MDM, plan and followup with patient. Patient  agrees with plan.   Meds ordered this encounter  Medications  . fluconazole (DIFLUCAN) 150 MG tablet    Sig: Take 1 tablet (150 mg total) by mouth once. 1 tab po x 1. May repeat in 72 hours if no improvement    Dispense:  2 tablet    Refill:  1  . miconazole (MICOTIN) 2 % cream    Sig: Apply 1 application topically 2 (two) times daily.    Dispense:  28.35 g    Refill:  0    *This clinic note was created using [employer]. Therefore, there may be occasional mistakes despite careful proofreading.  ?   [name], MD 12/15/16 1630

## 2016-12-17 LAB — URINE CULTURE
Culture: <10000 — AB
Special Requests: NORMAL

## 2017-01-29 ENCOUNTER — Encounter: Payer: Self-pay | Admitting: Emergency Medicine

## 2017-01-29 ENCOUNTER — Telehealth: Payer: Self-pay | Admitting: Emergency Medicine

## 2017-01-29 NOTE — Telephone Encounter (Signed)
Patient returned call, demo verified, results communicated, patient states she understands results and is doing well.

## 2017-02-03 DIAGNOSIS — F411 Generalized anxiety disorder: Secondary | ICD-10-CM | POA: Insufficient documentation

## 2017-02-03 DIAGNOSIS — G44309 Post-traumatic headache, unspecified, not intractable: Secondary | ICD-10-CM | POA: Insufficient documentation

## 2017-02-26 DIAGNOSIS — R0602 Shortness of breath: Secondary | ICD-10-CM | POA: Insufficient documentation

## 2017-05-02 ENCOUNTER — Other Ambulatory Visit (INDEPENDENT_AMBULATORY_CARE_PROVIDER_SITE_OTHER): Payer: Self-pay | Admitting: Vascular Surgery

## 2017-05-02 DIAGNOSIS — I6523 Occlusion and stenosis of bilateral carotid arteries: Secondary | ICD-10-CM

## 2017-05-06 ENCOUNTER — Encounter (INDEPENDENT_AMBULATORY_CARE_PROVIDER_SITE_OTHER): Payer: Self-pay | Admitting: Vascular Surgery

## 2017-05-06 ENCOUNTER — Ambulatory Visit (INDEPENDENT_AMBULATORY_CARE_PROVIDER_SITE_OTHER): Payer: Medicare Other

## 2017-05-06 ENCOUNTER — Ambulatory Visit (INDEPENDENT_AMBULATORY_CARE_PROVIDER_SITE_OTHER): Payer: Medicare Other | Admitting: Vascular Surgery

## 2017-05-06 VITALS — BP 154/77 | HR 63 | Resp 17 | Ht 65.0 in | Wt 163.0 lb

## 2017-05-06 DIAGNOSIS — E7800 Pure hypercholesterolemia, unspecified: Secondary | ICD-10-CM

## 2017-05-06 DIAGNOSIS — I6523 Occlusion and stenosis of bilateral carotid arteries: Secondary | ICD-10-CM | POA: Diagnosis not present

## 2017-05-06 DIAGNOSIS — I25118 Atherosclerotic heart disease of native coronary artery with other forms of angina pectoris: Secondary | ICD-10-CM | POA: Insufficient documentation

## 2017-05-06 DIAGNOSIS — I10 Essential (primary) hypertension: Secondary | ICD-10-CM | POA: Diagnosis not present

## 2017-05-06 DIAGNOSIS — I2510 Atherosclerotic heart disease of native coronary artery without angina pectoris: Secondary | ICD-10-CM

## 2017-05-06 NOTE — Assessment & Plan Note (Signed)
lipid control important in reducing the progression of atherosclerotic disease. Continue statin therapy  

## 2017-05-06 NOTE — Assessment & Plan Note (Signed)
Her carotid duplex today reveals stable 1-39% right ICA stenosis and a widely patent left carotid artery stent. No progression from her previous study one year ago. Continue aspirin and Crestor from a carotid standpoint. Also on Plavix for her heart which is certainly fine by me 2. Recheck in 1 year

## 2017-05-06 NOTE — Progress Notes (Signed)
MRN : [id number]  Kelly Ritter is a 83 y.o. (1934/04/26) female who presents with chief complaint of  Chief Complaint  Patient presents with  . Carotid    62yr follow up  .  History of Present Illness: Patient returns in follow-up of her carotid stenosis. She is now over 2 years status post left carotid artery stent placement for high-grade symptomatic stenosis. She has done very well since the procedure and has no current focal neurologic symptoms. Specifically, the patient denies amaurosis fugax, speech or swallowing difficulties, or arm or leg weakness or numbness. Her carotid duplex today reveals stable 1-39% right ICA stenosis and a widely patent left carotid artery stent. Regression from her previous study one year ago  Current Outpatient Prescriptions  Medication Sig Dispense Refill  . acetaminophen (TYLENOL) 650 MG CR tablet Take 650 mg by mouth at bedtime as needed for pain.     Marland Kitchen aspirin EC 81 MG tablet Take 81 mg by mouth daily.    . clopidogrel (PLAVIX) 75 MG tablet Take 75 mg by mouth daily.    Marland Kitchen lisinopril (PRINIVIL,ZESTRIL) 10 MG tablet Take 10 mg by mouth daily.    . miconazole (MICOTIN) 2 % cream Apply 1 application topically 2 (two) times daily. 28.35 g 0  . mupirocin ointment (BACTROBAN) 2 % Apply 1 application topically 2 (two) times daily. 22 g 0  . nebivolol (BYSTOLIC) 5 MG tablet Take 5 mg by mouth daily.    . potassium gluconate 595 MG TABS tablet Take 595 mg by mouth at bedtime as needed (leg cramps).    . rosuvastatin (CRESTOR) 20 MG tablet Take 20 mg by mouth at bedtime.    . cefdinir (OMNICEF) 300 MG capsule Take 1 capsule (300 mg total) by mouth 2 (two) times daily. (Patient not taking: Reported on 05/06/2017) 20 capsule 0   No current facility-administered medications for this visit.     Past Medical History:  Diagnosis Date  . CAD (coronary artery disease)    s/p PCI and stent placement of circumflex and LAD and RCA.  restenosis of RCA 2014 with drug  eluting stent  . Collagen vascular disease ([place])   . Hiatal hernia   . Hypercholesterolemia   . Hypertension   . Peripheral vascular disease [facility])     Past Surgical History:  Procedure Laterality Date  . CARDIAC CATHETERIZATION N/A 09/25/2015   Procedure: Left Heart Cath and Coronary Angiography;  Surgeon: Teodoro Spray, MD;  Location: [place] [facility];  Service: Cardiovascular;  Laterality: N/A;  . CAROTID STENT Left   . JOINT REPLACEMENT     knee replacement  . PERIPHERAL VASCULAR CATHETERIZATION Left 12/19/2014   Procedure: Carotid PTA/Stent Intervention;  Surgeon: Algernon Huxley, MD;  Location: [place] [facility];  Service: Cardiovascular;  Laterality: Left;    Social History Social History  Substance Use Topics  . Smoking status: Former Smoker    Packs/day: 1.00    Years: 8.00    Quit date: 08/12/1989  . Smokeless tobacco: Former Systems developer  . Alcohol use No     Comment: rarely drinks alcohol    Family History Family History  Problem Relation Age of Onset  . Heart disease Mother   . Hypertension Brother   . Heart disease Brother 54       several MIs  . Heart disease Daughter 34       deceased from MI    Allergies  Allergen Reactions  . Baclofen  Other (See Comments)    Muscle spasms and cramps in shoulder area  . Latex Rash  . Sulfa Antibiotics Itching, Swelling and Rash     REVIEW OF SYSTEMS (Negative unless checked)  Constitutional: [] Weight loss  [] Fever  [] Chills Cardiac: [] Chest pain   [] Chest pressure   [] Palpitations   [] Shortness of breath when laying flat   [] Shortness of breath at rest   [] Shortness of breath with exertion. Vascular:  [] Pain in legs with walking   [] Pain in legs at rest   [] Pain in legs when laying flat   [] Claudication   [] Pain in feet when walking  [] Pain in feet at rest  [] Pain in feet when laying flat   [] History of DVT   [] Phlebitis   [] Swelling in legs   [] Varicose veins   [] Non-healing ulcers Pulmonary:   [] Uses home oxygen    [] Productive cough   [] Hemoptysis   [] Wheeze  [] COPD   [] Asthma Neurologic:  [] Dizziness  [] Blackouts   [] Seizures   [] History of stroke   [] History of TIA  [] Aphasia   [] Temporary blindness   [] Dysphagia   [] Weakness or numbness in arms   [] Weakness or numbness in legs Musculoskeletal:  [x] Arthritis   [] Joint swelling   [] Joint pain   [] Low back pain Hematologic:  [x] Easy bruising  [] Easy bleeding   [] Hypercoagulable state   [] Anemic  [] Hepatitis Gastrointestinal:  [] Blood in stool   [] Vomiting blood  [] Gastroesophageal reflux/heartburn   [] Difficulty swallowing. Genitourinary:  [] Chronic kidney disease   [] Difficult urination  [] Frequent urination  [] Burning with urination   [] Blood in urine Skin:  [] Rashes   [] Ulcers   [] Wounds Psychological:  [] History of anxiety   []  History of major depression.  Physical Examination  Vitals:   05/06/17 1105 05/06/17 1106  BP: (!) 152/75 (!) 154/77  Pulse: 69 63  Resp: 17   Weight: 163 lb (73.9 kg)   Height: 5\' 5"  (1.651 m)    Body mass index is 27.12 kg/m. Gen:  WD/WN, NAD. Appears younger than stated age. Head: [place]/AT, No temporalis wasting. Ear/Nose/Throat: Hearing grossly intact, nares w/o erythema or drainage, trachea midline Eyes: Conjunctiva clear. Sclera non-icteric Neck: Supple.  No bruit or JVD.  Pulmonary:  Good air movement, equal and clear to auscultation bilaterally.  Cardiac: RRR, normal S1, S2, no Murmurs, rubs or gallops. Vascular:  Vessel Right Left  Radial Palpable Palpable                                    Musculoskeletal: M/S 5/5 throughout.  No deformity or atrophy.  Neurologic: CN 2-12 intact. Sensation grossly intact in extremities.  Symmetrical.  Speech is fluent. Motor exam as listed above. Psychiatric: Judgment intact, Mood & affect appropriate for pt's clinical situation. Dermatologic: No rashes or ulcers noted.  No cellulitis or open wounds.      CBC Lab Results  Component Value Date   WBC 6.9  09/25/2015   HGB 13.4 09/25/2015   HCT 39.1 09/25/2015   MCV 91.2 09/25/2015   PLT 165 09/25/2015    BMET    Component Value Date/Time   NA 143 09/25/2015 0112   NA 140 10/01/2012 0531   K 3.8 09/25/2015 0112   K 4.3 10/01/2012 0531   CL 110 09/25/2015 0112   CL 109 (H) 10/01/2012 0531   CO2 26 09/25/2015 0112   CO2 23 10/01/2012 0531   GLUCOSE 124 (H)  09/25/2015 0112   GLUCOSE 136 (H) 10/01/2012 0531   BUN 15 09/25/2015 0112   BUN 16 10/01/2012 0531   CREATININE 0.79 09/25/2015 0112   CREATININE 0.63 10/01/2012 0531   CALCIUM 9.0 09/25/2015 0112   CALCIUM 8.8 10/01/2012 0531   GFRNONAA >60 09/25/2015 0112   GFRNONAA >60 10/01/2012 0531   GFRAA >60 09/25/2015 0112   GFRAA >60 10/01/2012 0531   CrCl cannot be calculated (Patient's most recent lab result is older than the maximum 21 days allowed.).  COAG Lab Results  Component Value Date   INR 0.99 12/12/2014    Radiology No results found.    Assessment/Plan CAD (coronary artery disease) Stable. Followed by cardiology  Hypercholesterolemia lipid control important in reducing the progression of atherosclerotic disease. Continue statin therapy   Carotid artery obstruction Her carotid duplex today reveals stable 1-39% right ICA stenosis and a widely patent left carotid artery stent. No progression from her previous study one year ago. Continue aspirin and Crestor from a carotid standpoint. Also on Plavix for her heart which is certainly fine by me 2. Recheck in 1 year  Hypertension blood pressure control important in reducing the progression of atherosclerotic disease. On appropriate oral medications.     Leotis Pain, MD  05/06/2017 11:32 AM    This note was created with Dragon medical transcription system.  Any errors from dictation are purely unintentional

## 2017-05-06 NOTE — Assessment & Plan Note (Signed)
Stable.  Followed by cardiology.   

## 2017-05-06 NOTE — Assessment & Plan Note (Signed)
blood pressure control important in reducing the progression of atherosclerotic disease. On appropriate oral medications.  

## 2017-05-06 NOTE — Patient Instructions (Signed)
Carotid Artery Disease The carotid arteries are arteries on both sides of the neck. They carry blood to the brain. Carotid artery disease is when the arteries get smaller (narrow) or get blocked. If these arteries get smaller or get blocked, you are more likely to have a stroke or warning stroke (transient ischemic attack). Follow these instructions at home:  Take medicines as told by your doctor. Make sure you understand all your medicine instructions. Do not stop your medicines without talking to your doctor first.  Follow your doctor's diet instructions. It is important to eat a healthy diet that includes plenty of: ? Fresh fruits. ? Vegetables. ? Lean meats.  Avoid: ? High-fat foods. ? High-sodium foods. ? Foods that are fried, overly processed, or have poor nutritional value.  Stay a healthy weight.  Stay active. Get at least 30 minutes of activity every day.  Do not smoke.  Limit alcohol use to: ? No more than 2 drinks a day for men. ? No more than 1 drink a day for women who are not pregnant.  Do not use illegal drugs.  Keep all doctor visits as told. Get help right away if:  You have sudden weakness or loss of feeling (numbness) on one side of the body, such as the face, arm, or leg.  You have sudden confusion.  You have trouble speaking (aphasia) or understanding.  You have sudden trouble seeing out of one or both eyes.  You have sudden trouble walking.  You have dizziness or feel like you might pass out (faint).  You have a loss of balance or your movements are not steady (uncoordinated).  You have a sudden, severe headache with no known cause.  You have trouble swallowing (dysphagia). Call your local emergency services (911 in [place] Do notdrive yourself to the clinic or hospital. This information is not intended to replace advice given to you by your health care provider. Make sure you discuss any questions you have with your health care  provider. Document Released: 07/15/2012 Document Revised: 01/04/2016 Document Reviewed: 01/27/2013 Elsevier Interactive Patient Education  2018 Elsevier Inc.  

## 2017-08-08 ENCOUNTER — Other Ambulatory Visit: Payer: Self-pay

## 2017-08-08 ENCOUNTER — Encounter: Payer: Self-pay | Admitting: Emergency Medicine

## 2017-08-08 ENCOUNTER — Ambulatory Visit
Admission: EM | Admit: 2017-08-08 | Discharge: 2017-08-08 | Disposition: A | Discharge status: Home / Self Care | Payer: Medicare Other | Attending: Family Medicine | Admitting: Family Medicine

## 2017-08-08 DIAGNOSIS — R05 Cough: Secondary | ICD-10-CM

## 2017-08-08 DIAGNOSIS — J069 Acute upper respiratory infection, unspecified: Secondary | ICD-10-CM | POA: Diagnosis not present

## 2017-08-08 DIAGNOSIS — B9789 Other viral agents as the cause of diseases classified elsewhere: Secondary | ICD-10-CM | POA: Diagnosis not present

## 2017-08-08 NOTE — ED Triage Notes (Signed)
Patient in today c/o 3 day history of dry cough and chest congestion. Patient has not checked temperature. Patient has tried OTC Delsym.

## 2017-08-08 NOTE — ED Provider Notes (Signed)
[facility]    CSN: [id number] Arrival date & time: 08/08/17  1126     History   Chief Complaint Chief Complaint  Patient presents with  . Cough    HPI Kelly Ritter is a 83 y.o. female.   The history is provided by the patient.  URI  Presenting symptoms: congestion, cough and rhinorrhea   Severity:  Moderate Onset quality:  Sudden Duration:  3 days Timing:  Constant Progression:  Unchanged Chronicity:  New Relieved by:  OTC medications Associated symptoms: no headaches, no neck pain, no sinus pain and no wheezing   Risk factors: being elderly     Past Medical History:  Diagnosis Date  . CAD (coronary artery disease)    s/p PCI and stent placement of circumflex and LAD and RCA.  restenosis of RCA 2014 with drug eluting stent  . Collagen vascular disease ([place])   . Hiatal hernia   . Hypercholesterolemia   . Hypertension   . Peripheral vascular disease [facility])     Patient Active Problem List   Diagnosis Date Noted  . CAD (coronary artery disease) 05/06/2017  . Hypercholesterolemia 05/06/2017  . Hypertension 05/06/2017  . Chest pain 09/24/2015  . 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 CVA (cerebral infarction) 12/12/2014    Past Surgical History:  Procedure Laterality Date  . CARDIAC CATHETERIZATION N/A 09/25/2015   Procedure: Left Heart Cath and Coronary Angiography;  Surgeon: Teodoro Spray, MD;  Location: [place] [facility];  Service: Cardiovascular;  Laterality: N/A;  . CAROTID STENT Left   . JOINT REPLACEMENT     knee replacement  . PERIPHERAL VASCULAR CATHETERIZATION Left 12/19/2014   Procedure: Carotid PTA/Stent Intervention;  Surgeon: Algernon Huxley, MD;  Location: [place] [facility];  Service: Cardiovascular;  Laterality: Left;    OB History    No data available       Home Medications    Prior to Admission medications   Medication Sig Start Date End Date Taking? Authorizing Provider  acetaminophen (TYLENOL) 650 MG CR tablet  Take 650 mg by mouth at bedtime as needed for pain.    Yes [provider]  clopidogrel (PLAVIX) 75 MG tablet Take 75 mg by mouth daily.   Yes [provider]  lisinopril (PRINIVIL,ZESTRIL) 10 MG tablet Take 10 mg by mouth daily.   Yes [provider]  nebivolol (BYSTOLIC) 5 MG tablet Take 5 mg by mouth daily.   Yes [provider]  potassium gluconate 595 MG TABS tablet Take 595 mg by mouth at bedtime as needed (leg cramps).   Yes [provider]  rosuvastatin (CRESTOR) 20 MG tablet Take 20 mg by mouth at bedtime.   Yes [provider]  aspirin EC 81 MG tablet Take 81 mg by mouth daily.    [provider]  cefdinir (OMNICEF) 300 MG capsule Take 1 capsule (300 mg total) by mouth 2 (two) times daily. Patient not taking: Reported on 05/06/2017 12/13/16   [name], MD  miconazole (MICOTIN) 2 % cream Apply 1 application topically 2 (two) times daily. 12/15/16   [name], MD  mupirocin ointment (BACTROBAN) 2 % Apply 1 application topically 2 (two) times daily. 12/13/16   [name], MD    Family History Family History  Problem Relation Age of Onset  . Heart disease Mother   . Hypertension Brother   . Heart disease Brother 70       several MIs  . Heart disease Daughter  52       deceased from MI    Social History Social History   Tobacco Use  . Smoking status: Former Smoker    Packs/day: 1.00    Years: 8.00    Pack years: 8.00    Last attempt to quit: 08/12/1989    Years since quitting: 28.0  . Smokeless tobacco: Former Network engineer Use Topics  . Alcohol use: No    Alcohol/week: 0.0 oz    Comment: rarely drinks alcohol  . Drug use: No     Allergies   Baclofen; Latex; and Sulfa antibiotics   Review of Systems Review of Systems  HENT: Positive for congestion and rhinorrhea. Negative for sinus pain.   Respiratory: Positive for cough. Negative for wheezing.   Musculoskeletal: Negative for neck pain.    Neurological: Negative for headaches.     Physical Exam Triage Vital Signs ED Triage Vitals  Enc Vitals Group     BP 08/08/17 1142 (!) 174/67     Pulse Rate 08/08/17 1142 70     Resp 08/08/17 1142 16     Temp 08/08/17 1142 98.1 F (36.7 C)     Temp Source 08/08/17 1142 Oral     SpO2 08/08/17 1142 98 %     Weight 08/08/17 1142 162 lb (73.5 kg)     Height 08/08/17 1142 5\' 5"  (1.651 m)     Head Circumference --      Peak Flow --      Pain Score 08/08/17 1143 0     Pain Loc --      Pain Edu? --      Excl. in [place]? --    No data found.  Updated Vital Signs BP (!) 174/67 (BP Location: Left Arm)   Pulse 70   Temp 98.1 F (36.7 C) (Oral)   Resp 16   Ht 5\' 5"  (1.651 m)   Wt 162 lb (73.5 kg)   SpO2 98%   BMI 26.96 kg/m   Visual Acuity Right Eye Distance:   Left Eye Distance:   Bilateral Distance:    Right Eye Near:   Left Eye Near:    Bilateral Near:     Physical Exam  Constitutional: She appears well-developed and well-nourished. No distress.  HENT:  Head: Normocephalic and atraumatic.  Right Ear: Tympanic membrane, external ear and ear canal normal.  Left Ear: Tympanic membrane, external ear and ear canal normal.  Nose: Rhinorrhea present. No mucosal edema, nose lacerations, sinus tenderness, nasal deformity, septal deviation or nasal septal hematoma. No epistaxis.  No foreign bodies. Right sinus exhibits no maxillary sinus tenderness and no frontal sinus tenderness. Left sinus exhibits no maxillary sinus tenderness and no frontal sinus tenderness.  Mouth/Throat: Uvula is midline, oropharynx is clear and moist and mucous membranes are normal. No oropharyngeal exudate.  Eyes: Conjunctivae and EOM are normal. Pupils are equal, round, and reactive to light. Right eye exhibits no discharge. Left eye exhibits no discharge. No scleral icterus.  Neck: Normal range of motion. Neck supple. No thyromegaly present.  Cardiovascular: Normal rate, regular rhythm and normal heart  sounds.  Pulmonary/Chest: Effort normal and breath sounds normal. No respiratory distress. She has no wheezes. She has no rales.  Lymphadenopathy:    She has no cervical adenopathy.  Skin: She is not diaphoretic.  Nursing note and vitals reviewed.    UC Treatments / Results  Labs (all labs ordered are listed, but only abnormal results are displayed) Labs Reviewed -  No data to display  EKG  EKG Interpretation None       Radiology No results found.  Procedures Procedures (including critical care time)  Medications Ordered in UC Medications - No data to display   Initial Impression / Assessment and Plan / UC Course  I have reviewed the triage vital signs and the nursing notes.  Pertinent labs & imaging results that were available during my care of the patient were reviewed by me and considered in my medical decision making (see chart for details).       Final Clinical Impressions(s) / UC Diagnoses   Final diagnoses:  Viral URI with cough    ED Discharge Orders    None     1. diagnosis reviewed with patient 2. rx as per orders above; reviewed possible side effects, interactions, risks and benefits  3. Recommend supportive treatment with rest, fluids 4. Follow-up prn if symptoms worsen or don't improve  Controlled Substance Prescriptions [place] Controlled Substance Registry consulted? Not Applicable   Norval Gable, MD 08/08/17 1251

## 2017-08-08 NOTE — Discharge Instructions (Signed)
Increase fluids, rest

## 2017-08-13 ENCOUNTER — Encounter: Payer: Self-pay | Admitting: Family Medicine

## 2017-08-13 ENCOUNTER — Ambulatory Visit: Payer: Medicare Other | Admitting: Family Medicine

## 2017-08-13 ENCOUNTER — Other Ambulatory Visit: Payer: Self-pay

## 2017-08-13 VITALS — BP 138/84 | HR 78 | Temp 98.7°F | Resp 16 | Ht 65.0 in | Wt 158.0 lb

## 2017-08-13 DIAGNOSIS — R2681 Unsteadiness on feet: Secondary | ICD-10-CM | POA: Diagnosis not present

## 2017-08-13 DIAGNOSIS — J40 Bronchitis, not specified as acute or chronic: Secondary | ICD-10-CM | POA: Diagnosis not present

## 2017-08-13 DIAGNOSIS — R7303 Prediabetes: Secondary | ICD-10-CM

## 2017-08-13 DIAGNOSIS — Z23 Encounter for immunization: Secondary | ICD-10-CM | POA: Diagnosis not present

## 2017-08-13 DIAGNOSIS — G3184 Mild cognitive impairment, so stated: Secondary | ICD-10-CM | POA: Diagnosis not present

## 2017-08-13 DIAGNOSIS — R7989 Other specified abnormal findings of blood chemistry: Secondary | ICD-10-CM

## 2017-08-13 DIAGNOSIS — F411 Generalized anxiety disorder: Secondary | ICD-10-CM | POA: Diagnosis not present

## 2017-08-13 DIAGNOSIS — I10 Essential (primary) hypertension: Secondary | ICD-10-CM

## 2017-08-13 DIAGNOSIS — I2510 Atherosclerotic heart disease of native coronary artery without angina pectoris: Secondary | ICD-10-CM | POA: Diagnosis not present

## 2017-08-13 DIAGNOSIS — I6523 Occlusion and stenosis of bilateral carotid arteries: Secondary | ICD-10-CM

## 2017-08-13 MED ORDER — DOXYCYCLINE HYCLATE 100 MG PO CAPS: 100.0000 mg | ORAL_CAPSULE | Freq: Two times a day (BID) | ORAL | 0 refills | Status: DC

## 2017-08-13 MED ORDER — ZOSTER VAC RECOMB ADJUVANTED 50 MCG/0.5ML IM SUSR: 0.5000 mL | Freq: Once | INTRAMUSCULAR | 1 refills | Status: AC

## 2017-08-14 ENCOUNTER — Encounter: Payer: Self-pay | Admitting: Family Medicine

## 2017-08-14 ENCOUNTER — Other Ambulatory Visit: Payer: Self-pay | Admitting: Family Medicine

## 2017-08-14 DIAGNOSIS — E538 Deficiency of other specified B group vitamins: Secondary | ICD-10-CM | POA: Insufficient documentation

## 2017-08-14 LAB — VITAMIN B12: Vitamin B-12: 312 pg/mL (ref 232–1245)

## 2017-08-14 LAB — HEMOGLOBIN A1C
Est. average glucose Bld gHb Est-mCnc: 123 mg/dL
HEMOGLOBIN A1C: 5.9 % — AB (ref 4.8–5.6)

## 2017-08-14 LAB — TSH: TSH: 4.46 u[IU]/mL (ref 0.450–4.500)

## 2017-08-14 MED ORDER — B-12 500 MCG PO TABS: 1.0000 | ORAL_TABLET | Freq: Every day | ORAL | Status: DC

## 2017-08-14 NOTE — Progress Notes (Addendum)
Date:  08/13/2017   Name:  Kelly Ritter   DOB:  Dec 14, 1933   MRN:  [id number]  PCP:  Adline Potter, MD    Chief Complaint: Establish Care and Cough (since 08/07/2017)   History of Present Illness:  This is a 84 y.o. female seen for initial visit. Hx GAD on BuSpar in past, now off. CAD s/p stents 5 yrs ago, L carotid stenosis s/p stent 2 yrs ago, recent normal stress test. HTN on lisinopril/Bystolic, HLD on Crestor, prediabetes with a1c 5.7% in March. Concussion 03/2016, still gets daily HAs, Tylenol helps, concerned re: memory loss. [place] uses K+ supplement prn, s/p SCC skin, followed by derm. Seen [place] 5d ago dx'd with viral URI, now with cough prod green phlegm, sinus congestion, rhinorrhea. Father died heart dz 15, mother died heart dz 52, multiple sibs with heart dz. No imms UTD. Colonoscopy 3 yrs ago benign polyps, declines mammo.  Review of Systems:  Review of Systems  Constitutional: Negative for chills and fever.  HENT: Negative for ear pain and trouble swallowing.   Eyes: Negative for pain.  Respiratory: Negative for shortness of breath.   Cardiovascular: Negative for chest pain and leg swelling.  Endocrine: Negative for polydipsia and polyuria.  Genitourinary: Negative for difficulty urinating.  Neurological: Negative for syncope and light-headedness.    Patient Active Problem List   Diagnosis Date Noted  . Prediabetes 08/13/2017  . Coronary artery disease 05/06/2017  . Pure hypercholesterolemia 05/06/2017  . SOBOE (shortness of breath on exertion) 02/26/2017  . Anxiety, generalized 02/03/2017  . Post-concussion headache 02/03/2017  . Elevated TSH 10/22/2016  . Hematuria, microscopic 10/22/2016  . Primary osteoarthritis of hand 01/09/2016  . Arthralgia of multiple sites 12/18/2015  . Chronic hand pain, right 12/18/2015  . Encounter for long-term (current) use of high-risk medication 12/18/2015  . RSD (reflex sympathetic dystrophy) 12/18/2015  . Chest pain 09/24/2015  .  Personal history of other malignant neoplasm of skin 08/02/2015  . Benign essential hypertension 01/06/2015  . 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 CVA (cerebral infarction) 12/12/2014  . Bilateral carotid artery stenosis 10/21/2014  . Other allergic rhinitis 10/21/2014  . Paroxysmal supraventricular tachycardia ([place]) 07/05/2014    Prior to Admission medications   Medication Sig Start Date End Date Taking? Authorizing Provider  acetaminophen (TYLENOL) 650 MG CR tablet Take 650 mg by mouth at bedtime as needed for pain.    Yes [provider]  clopidogrel (PLAVIX) 75 MG tablet Take 75 mg by mouth daily.   Yes [provider]  lisinopril (PRINIVIL,ZESTRIL) 10 MG tablet Take 10 mg by mouth daily.   Yes [provider]  nebivolol (BYSTOLIC) 5 MG tablet Take 5 mg by mouth daily.   Yes [provider]  potassium gluconate 595 MG TABS tablet Take 595 mg by mouth at bedtime as needed (leg cramps).   Yes [provider]  rosuvastatin (CRESTOR) 20 MG tablet Take 20 mg by mouth at bedtime.   Yes [provider]  citalopram (CELEXA) 10 MG tablet Take 10 mg by mouth daily.    [provider]  doxycycline (VIBRAMYCIN) 100 MG capsule Take 1 capsule (100 mg total) by mouth 2 (two) times daily. 08/13/17   Adline Potter, MD    Allergies  Allergen Reactions  . Sulfa Antibiotics Itching, Swelling, Rash and Hives    Swelling    . Latex Rash, Hives and Other (See Comments)    Skin eruptions    . Baclofen Other (See  Comments)    Muscle spasms and cramps in shoulder area  . Donepezil Other (See Comments)    Past Surgical History:  Procedure Laterality Date  . CARDIAC CATHETERIZATION N/A 09/25/2015   Procedure: Left Heart Cath and Coronary Angiography;  Surgeon: Teodoro Spray, MD;  Location: [place] [facility];  Service: Cardiovascular;  Laterality: N/A;  . CAROTID STENT Left   . JOINT REPLACEMENT     knee replacement  . PERIPHERAL  VASCULAR CATHETERIZATION Left 12/19/2014   Procedure: Carotid PTA/Stent Intervention;  Surgeon: Algernon Huxley, MD;  Location: [place] [facility];  Service: Cardiovascular;  Laterality: Left;    Social History   Tobacco Use  . Smoking status: Former Smoker    Packs/day: 1.00    Years: 8.00    Pack years: 8.00    Last attempt to quit: 08/12/1989    Years since quitting: 28.0  . Smokeless tobacco: Former Network engineer Use Topics  . Alcohol use: No    Alcohol/week: 0.0 oz    Comment: rarely drinks alcohol  . Drug use: No    Family History  Problem Relation Age of Onset  . Heart disease Mother   . Hypertension Brother   . Heart disease Brother 67       several MIs  . Heart disease Daughter 26       deceased from MI    Medication list has been reviewed and updated.  Physical Examination: BP 138/84   Pulse 78   Temp 98.7 F (37.1 C) (Oral)   Resp 16   Ht 5\' 5"  (1.651 m)   Wt 158 lb (71.7 kg)   SpO2 98%   BMI 26.29 kg/m   Physical Exam  Constitutional: She is oriented to person, place, and time. She appears well-developed and well-nourished.  HENT:  Head: Normocephalic and atraumatic.  Right Ear: External ear normal.  Left Ear: External ear normal.  Nose: Nose normal.  Mouth/Throat: Oropharynx is clear and moist.  TMs clear  Eyes: Conjunctivae and EOM are normal. Pupils are equal, round, and reactive to light.  Neck: Neck supple. No thyromegaly present.  Cardiovascular: Normal rate, regular rhythm and normal heart sounds.  Pulmonary/Chest: Effort normal and breath sounds normal.  Abdominal: Soft. She exhibits no distension and no mass. There is no tenderness.  Musculoskeletal: She exhibits no edema.  Lymphadenopathy:    She has no cervical adenopathy.  Neurological: She is alert and oriented to person, place, and time. Coordination normal.  Romberg unsteady, gait antalgic SLUMS 26/30 with HS education  Skin: Skin is warm and dry.  Psychiatric: She has a normal  mood and affect. Her behavior is normal.  Nursing note and vitals reviewed.   Assessment and Plan:  1. Coronary artery disease involving native coronary artery of native heart without angina pectoris Stable on BB/ACEI/statin/Plavix, followed by cards  2. Bilateral carotid artery stenosis Stable on Plavix, followed by vascular  3. Benign essential hypertension Well controlled on Bystolic/lsinopril  4. Anxiety, generalized Well controlled on Celexa  5. MCI (mild cognitive impairment) Dx/px discussed, may be due to hx concussion  6. Gait instability Consider PT referral - B12  7. Elevated TSH 10/2016 - TSH  8. Prediabetes - HgB A1c  9. Need for influenza vaccination - Flu vaccine HIGH DOSE PF (Fluzone High dose)  10. Need for pneumococcal vaccination - Pneumococcal conjugate vaccine 13-valent  11. Need for zoster vaccination - Zoster Vaccine Adjuvanted [facility]) injection; Inject 0.5 mLs into the muscle  once for 1 dose.  Dispense: 0.5 mL; Refill: 1  12. Need for diphtheria-tetanus-pertussis (Tdap) vaccine Consider next visit  Return in about 4 weeks (around 09/10/2017).   One hour spent with patient/husband, over half in counseling  Regnia Mathwig M. [facility]  08/14/2017   Addendum: Pt also dx'd with bronchitis, rx'd with doxy 100 mg bid x 7d, call if sxs worsen/persist

## 2017-09-10 ENCOUNTER — Ambulatory Visit: Payer: Medicare Other | Admitting: Family Medicine

## 2017-09-16 ENCOUNTER — Encounter: Payer: Self-pay | Admitting: Family Medicine

## 2017-09-16 ENCOUNTER — Ambulatory Visit (INDEPENDENT_AMBULATORY_CARE_PROVIDER_SITE_OTHER): Payer: Medicare Other | Admitting: Family Medicine

## 2017-09-16 VITALS — BP 128/76 | HR 77 | Resp 16 | Ht 65.0 in | Wt 160.0 lb

## 2017-09-16 DIAGNOSIS — G8929 Other chronic pain: Secondary | ICD-10-CM | POA: Diagnosis not present

## 2017-09-16 DIAGNOSIS — R7303 Prediabetes: Secondary | ICD-10-CM

## 2017-09-16 DIAGNOSIS — I2510 Atherosclerotic heart disease of native coronary artery without angina pectoris: Secondary | ICD-10-CM

## 2017-09-16 DIAGNOSIS — Z23 Encounter for immunization: Secondary | ICD-10-CM | POA: Diagnosis not present

## 2017-09-16 DIAGNOSIS — I10 Essential (primary) hypertension: Secondary | ICD-10-CM | POA: Diagnosis not present

## 2017-09-16 DIAGNOSIS — E538 Deficiency of other specified B group vitamins: Secondary | ICD-10-CM

## 2017-09-16 DIAGNOSIS — M79641 Pain in right hand: Secondary | ICD-10-CM

## 2017-09-16 DIAGNOSIS — I6523 Occlusion and stenosis of bilateral carotid arteries: Secondary | ICD-10-CM

## 2017-09-16 DIAGNOSIS — F411 Generalized anxiety disorder: Secondary | ICD-10-CM | POA: Diagnosis not present

## 2017-09-16 MED ORDER — TETANUS-DIPHTH-ACELL PERTUSSIS 5-2.5-18.5 LF-MCG/0.5 IM SUSP: 0.5000 mL | Freq: Once | INTRAMUSCULAR | 0 refills | Status: AC

## 2017-09-17 NOTE — Progress Notes (Signed)
Date:  09/16/2017   Name:  Kelly Ritter   DOB:  [birth year]   MRN:  [id number]  PCP:  Adline Potter, MD    Chief Complaint: Coronary Artery Disease   History of Present Illness:  This is a 84 y.o. female seen for one month f/u from initial visit. CAD/carotid stenosis stable on BB/ACEI/statin/Plavix, followed by cards/vascular. Anxiety improved, taking Celexa prn only, thinks can stop. R hand numbness past month.   Review of Systems:  Review of Systems  Constitutional: Negative for chills and fever.  Respiratory: Negative for cough and shortness of breath.   Cardiovascular: Negative for chest pain and leg swelling.  Genitourinary: Negative for difficulty urinating.  Neurological: Negative for syncope and light-headedness.    Patient Active Problem List   Diagnosis Date Noted  . B12 deficiency 08/14/2017  . Prediabetes 08/13/2017  . Coronary artery disease 05/06/2017  . Pure hypercholesterolemia 05/06/2017  . SOBOE (shortness of breath on exertion) 02/26/2017  . Anxiety, generalized 02/03/2017  . Post-concussion headache 02/03/2017  . Elevated TSH 10/22/2016  . Hematuria, microscopic 10/22/2016  . Primary osteoarthritis of hand 01/09/2016  . Arthralgia of multiple sites 12/18/2015  . Chronic hand pain, right 12/18/2015  . Encounter for long-term (current) use of high-risk medication 12/18/2015  . RSD (reflex sympathetic dystrophy) 12/18/2015  . Chest pain 09/24/2015  . Personal history of other malignant neoplasm of skin 08/02/2015  . Benign essential hypertension 01/06/2015  . 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 CVA (cerebral infarction) 12/12/2014  . Bilateral carotid artery stenosis 10/21/2014  . Other allergic rhinitis 10/21/2014  . Paroxysmal supraventricular tachycardia ([place]) 07/05/2014    Prior to Admission medications   Medication Sig Start Date End Date Taking? Authorizing Provider  acetaminophen (TYLENOL) 650 MG CR tablet Take 650 mg by mouth at bedtime  as needed for pain.    Yes [provider]  clopidogrel (PLAVIX) 75 MG tablet Take 75 mg by mouth daily.   Yes [provider]  Cyanocobalamin (B-12) 500 MCG TABS Take 1 tablet by mouth daily. 08/14/17  Yes Lasean Rahming, Gwyndolyn Saxon, MD  lisinopril (PRINIVIL,ZESTRIL) 10 MG tablet Take 10 mg by mouth daily.   Yes [provider]  nebivolol (BYSTOLIC) 5 MG tablet Take 5 mg by mouth daily.   Yes [provider]  potassium gluconate 595 MG TABS tablet Take 595 mg by mouth at bedtime as needed (leg cramps).   Yes [provider]  rosuvastatin (CRESTOR) 20 MG tablet Take 20 mg by mouth at bedtime.   Yes [provider]    Allergies  Allergen Reactions  . Sulfa Antibiotics Itching, Swelling, Rash and Hives    Swelling    . Latex Rash, Hives and Other (See Comments)    Skin eruptions    . Baclofen Other (See Comments)    Muscle spasms and cramps in shoulder area  . Donepezil Other (See Comments)    Past Surgical History:  Procedure Laterality Date  . CARDIAC CATHETERIZATION N/A 09/25/2015   Procedure: Left Heart Cath and Coronary Angiography;  Surgeon: Teodoro Spray, MD;  Location: [place] [facility];  Service: Cardiovascular;  Laterality: N/A;  . CAROTID STENT Left   . JOINT REPLACEMENT     knee replacement  . PERIPHERAL VASCULAR CATHETERIZATION Left 12/19/2014   Procedure: Carotid PTA/Stent Intervention;  Surgeon: Algernon Huxley, MD;  Location: [place] [facility];  Service: Cardiovascular;  Laterality: Left;    Social History   Tobacco Use  . Smoking  status: Former Smoker    Packs/day: 1.00    Years: 8.00    Pack years: 8.00    Last attempt to quit: 08/12/1989    Years since quitting: 28.1  . Smokeless tobacco: Former Network engineer Use Topics  . Alcohol use: No    Alcohol/week: 0.0 oz    Comment: rarely drinks alcohol  . Drug use: No    Family History  Problem Relation Age of Onset  . Heart disease Mother   . Hypertension Brother    . Heart disease Brother 28       several MIs  . Heart disease Daughter 41       deceased from MI    Medication list has been reviewed and updated.  Physical Examination: BP 128/76   Pulse 77   Resp 16   Ht 5\' 5"  (1.651 m)   Wt 160 lb (72.6 kg)   SpO2 98%   BMI 26.63 kg/m   Physical Exam  Constitutional: She appears well-developed and well-nourished.  Cardiovascular: Normal rate, regular rhythm and normal heart sounds.  Pulmonary/Chest: Effort normal and breath sounds normal.  Musculoskeletal: She exhibits no edema.  Neurological: She is alert.  Skin: Skin is warm and dry.  Psychiatric: She has a normal mood and affect. Her behavior is normal.  Nursing note and vitals reviewed.   Assessment and Plan:  1. Coronary artery disease involving native coronary artery of native heart without angina pectoris Stable on BB/ACEI/statin/Plavix, cards following  2. Bilateral carotid artery stenosis Stable on Plavix, vascular following  3. Benign essential hypertension Well controlled on Bystolic/lisinopril  4. Anxiety, generalized Well controlled, recommend taper off Celexa over next month  5. Chronic hand pain, right Likely CTS, trial wrist splint overnight, call if sxs worsen/persist  6. Prediabetes A1c ok last month  7. B12 deficiency On supplement  8. Need for diphtheria-tetanus-pertussis (Tdap) vaccine - Tdap (BOOSTRIX) 5-2.5-18.5 LF-MCG/0.5 injection; Inject 0.5 mLs into the muscle once for 1 dose.  Dispense: 0.5 mL; Refill: 0  Return in about 6 months (around 03/16/2018).  [name]. [facility]  09/17/2017

## 2017-10-03 ENCOUNTER — Other Ambulatory Visit: Payer: Self-pay | Admitting: Neurology

## 2017-10-03 DIAGNOSIS — R4189 Other symptoms and signs involving cognitive functions and awareness: Secondary | ICD-10-CM

## 2017-10-03 DIAGNOSIS — G44099 Other trigeminal autonomic cephalgias (TAC), not intractable: Secondary | ICD-10-CM

## 2017-10-14 ENCOUNTER — Ambulatory Visit
Admission: RE | Admit: 2017-10-14 | Discharge: 2017-10-14 | Disposition: A | Discharge status: Home / Self Care | Payer: Medicare Other | Source: Ambulatory Visit | Attending: Neurology | Admitting: Neurology

## 2017-10-14 DIAGNOSIS — R4189 Other symptoms and signs involving cognitive functions and awareness: Secondary | ICD-10-CM

## 2017-10-14 DIAGNOSIS — G44099 Other trigeminal autonomic cephalgias (TAC), not intractable: Secondary | ICD-10-CM | POA: Diagnosis present

## 2018-01-11 ENCOUNTER — Ambulatory Visit (INDEPENDENT_AMBULATORY_CARE_PROVIDER_SITE_OTHER): Payer: Medicare Other

## 2018-01-11 ENCOUNTER — Other Ambulatory Visit: Payer: Self-pay

## 2018-01-11 ENCOUNTER — Ambulatory Visit
Admission: EM | Admit: 2018-01-11 | Discharge: 2018-01-11 | Disposition: A | Discharge status: Home / Self Care | Payer: Medicare Other | Attending: Emergency Medicine | Admitting: Emergency Medicine

## 2018-01-11 DIAGNOSIS — M25572 Pain in left ankle and joints of left foot: Secondary | ICD-10-CM | POA: Diagnosis not present

## 2018-01-11 DIAGNOSIS — S82435A Nondisplaced oblique fracture of shaft of left fibula, initial encounter for closed fracture: Secondary | ICD-10-CM | POA: Diagnosis not present

## 2018-01-11 DIAGNOSIS — W19XXXA Unspecified fall, initial encounter: Secondary | ICD-10-CM

## 2018-01-11 MED ORDER — TRAMADOL HCL 50 MG PO TABS: 50.0000 mg | ORAL_TABLET | Freq: Four times a day (QID) | ORAL | 0 refills | Status: DC | PRN

## 2018-01-11 NOTE — ED Notes (Signed)
Left knee wound cleansed with Hibiclens and NS. Bacitracin applied and bandaid

## 2018-01-11 NOTE — ED Notes (Signed)
Walking boot applied to left foot

## 2018-01-11 NOTE — Discharge Instructions (Signed)
Apply ice 20 minutes out of every 2 hours 4-5 times daily for comfort.  Elevate above your heart most of the time.  Arrange a follow-up appointment with Dr. Marry Guan as soon as possible. Use a walker to assist with ambulation and prevent pain

## 2018-01-11 NOTE — ED Provider Notes (Signed)
[facility]    CSN: [id number] Arrival date & time: 01/11/18  1032     History   Chief Complaint Chief Complaint  Patient presents with  . Ankle Pain    HPI Kelly Ritter is a 84 y.o. female.   HPI  84 year old female presents with left ankle pain.  She states that today she was watering her grass when she slipped on a muddy incline in her left ankle to twist and scraping her left knee.  She also had sustained a small abrasion over the lateral malleolus on the right.  Since that time she has not been able to bear weight on that side.  Pain is both over the medial and lateral malleoli as well as anterior.  She has no foot pain in the mid or forefoot.  There is mild swelling over both malleoli inferiorly.  There is fusion of the right ankle trauma.       Past Medical History:  Diagnosis Date  . CAD (coronary artery disease)    s/p PCI and stent placement of circumflex and LAD and RCA.  restenosis of RCA 2014 with drug eluting stent  . Collagen vascular disease ([place])   . Hiatal hernia   . Hypercholesterolemia   . Hypertension   . Peripheral vascular disease [facility])     Patient Active Problem List   Diagnosis Date Noted  . B12 deficiency 08/14/2017  . Prediabetes 08/13/2017  . Coronary artery disease 05/06/2017  . Pure hypercholesterolemia 05/06/2017  . SOBOE (shortness of breath on exertion) 02/26/2017  . Anxiety, generalized 02/03/2017  . Post-concussion headache 02/03/2017  . Elevated TSH 10/22/2016  . Hematuria, microscopic 10/22/2016  . Primary osteoarthritis of hand 01/09/2016  . Arthralgia of multiple sites 12/18/2015  . Chronic hand pain, right 12/18/2015  . Encounter for long-term (current) use of high-risk medication 12/18/2015  . RSD (reflex sympathetic dystrophy) 12/18/2015  . Chest pain 09/24/2015  . Personal history of other malignant neoplasm of skin 08/02/2015  . Benign essential hypertension 01/06/2015  . Carotid artery obstruction  12/19/2014  . CVA (cerebral infarction) 12/12/2014  . Bilateral carotid artery stenosis 10/21/2014  . Other allergic rhinitis 10/21/2014  . Paroxysmal supraventricular tachycardia ([place]) 07/05/2014    Past Surgical History:  Procedure Laterality Date  . CARDIAC CATHETERIZATION N/A 09/25/2015   Procedure: Left Heart Cath and Coronary Angiography;  Surgeon: Teodoro Spray, MD;  Location: [place] [facility];  Service: Cardiovascular;  Laterality: N/A;  . CAROTID STENT Left   . JOINT REPLACEMENT     knee replacement  . PERIPHERAL VASCULAR CATHETERIZATION Left 12/19/2014   Procedure: Carotid PTA/Stent Intervention;  Surgeon: Algernon Huxley, MD;  Location: [place] [facility];  Service: Cardiovascular;  Laterality: Left;    OB History   None      Home Medications    Prior to Admission medications   Medication Sig Start Date End Date Taking? Authorizing Provider  acetaminophen (TYLENOL) 650 MG CR tablet Take 650 mg by mouth at bedtime as needed for pain.     [provider]  clopidogrel (PLAVIX) 75 MG tablet Take 75 mg by mouth daily.    [provider]  Cyanocobalamin (B-12) 500 MCG TABS Take 1 tablet by mouth daily. 08/14/17   [place], Gwyndolyn Saxon, MD  lisinopril (PRINIVIL,ZESTRIL) 10 MG tablet Take 10 mg by mouth daily.    [provider]  nebivolol (BYSTOLIC) 5 MG tablet Take 5 mg by mouth daily.    [provider]  potassium gluconate 595 MG TABS tablet Take 595 mg by mouth at bedtime as needed (leg cramps).    [provider]  rosuvastatin (CRESTOR) 20 MG tablet Take 20 mg by mouth at bedtime.    [provider]  traMADol (ULTRAM) 50 MG tablet Take 1 tablet (50 mg total) by mouth every 6 (six) hours as needed for up to 10 doses for severe pain. 01/11/18   Lorin Picket, PA-C    Family History Family History  Problem Relation Age of Onset  . Heart disease Mother   . Hypertension Brother   . Heart disease Brother 36       several  MIs  . Heart disease Daughter 91       deceased from MI    Social History Social History   Tobacco Use  . Smoking status: Former Smoker    Packs/day: 1.00    Years: 8.00    Pack years: 8.00    Last attempt to quit: 08/12/1989    Years since quitting: 28.4  . Smokeless tobacco: Former Network engineer Use Topics  . Alcohol use: Yes    Alcohol/week: 0.0 oz    Comment: rarely drinks alcohol  . Drug use: No     Allergies   Sulfa antibiotics; Latex; Baclofen; and Donepezil   Review of Systems Review of Systems  Constitutional: Positive for activity change. Negative for chills, fatigue and fever.  Musculoskeletal: Positive for arthralgias and gait problem.  All other systems reviewed and are negative.    Physical Exam Triage Vital Signs ED Triage Vitals  Enc Vitals Group     BP 01/11/18 1045 (!) 143/54     Pulse Rate 01/11/18 1045 77     Resp 01/11/18 1045 16     Temp 01/11/18 1045 97.7 F (36.5 C)     Temp Source 01/11/18 1045 Oral     SpO2 01/11/18 1045 97 %     Weight 01/11/18 1046 153 lb (69.4 kg)     Height 01/11/18 1046 5\' 5"  (1.651 m)     Head Circumference --      Peak Flow --      Pain Score 01/11/18 1046 5     Pain Loc --      Pain Edu? --      Excl. in [place]? --    No data found.  Updated Vital Signs BP (!) 143/54 (BP Location: Left Arm)   Pulse 77   Temp 97.7 F (36.5 C) (Oral)   Resp 16   Ht 5\' 5"  (1.651 m)   Wt 153 lb (69.4 kg)   SpO2 97%   BMI 25.46 kg/m   Visual Acuity Right Eye Distance:   Left Eye Distance:   Bilateral Distance:    Right Eye Near:   Left Eye Near:    Bilateral Near:     Physical Exam  Constitutional: She is oriented to person, place, and time. She appears well-developed and well-nourished. No distress.  HENT:  Head: Normocephalic.  Eyes: Pupils are equal, round, and reactive to light. Right eye exhibits no discharge. Left eye exhibits no discharge.  Neck: Normal range of motion.  Musculoskeletal: She exhibits  edema and tenderness. She exhibits no deformity.  Examination of the right ankle shows a small abrasion over the inferior portion of the lateral malleolus.  Emanation of the left lower extremity shows an abrasion over the knee over the anterior medial tibia.  He has good range of  motion.  There is no PF tenderness.  Collateral ligaments are intact bilaterally.  Examination of the left ankle shows mild swelling inferior to the medial malleolus as well as the lateral malleolus.  Compression of the distal tib-fib does cause her to have ankle pain.  Patient has pain almost everywhere that is touched including the inferior lateral and medial malleolar lie.  Maximal over the distal shaft of the fibula.  She has no calcaneal tenderness or pain.  There is no pain of the mid or forefoot.  He has very limited range of motion due to discomfort.  Subtalar motion is intact.  Neurological: She is alert and oriented to person, place, and time.  Skin: Skin is warm and dry. She is not diaphoretic.  Psychiatric: She has a normal mood and affect. Her behavior is normal. Judgment and thought content normal.  Nursing note and vitals reviewed.    UC Treatments / Results  Labs (all labs ordered are listed, but only abnormal results are displayed) Labs Reviewed - No data to display  EKG None  Radiology Dg Ankle Complete Left  Result Date: 01/11/2018 CLINICAL DATA:  Pain after trauma EXAM: LEFT ANKLE COMPLETE - 3+ VIEW COMPARISON:  None. FINDINGS: Distal fibular diaphysis fracture. Calcifications distal to the medial malleolus are consistent with an avulsion injury. No other acute bony abnormalities. Soft tissue swelling. IMPRESSION: Fibular fracture. Calcifications distal to the medial malleolus are consistent with an age indeterminate avulsion injury. Electronically Signed   By: Dorise Bullion III M.D   On: 01/11/2018 11:45    Procedures Procedures (including critical care time)  Medications Ordered in  UC Medications - No data to display  Initial Impression / Assessment and Plan / UC Course  I have reviewed the triage vital signs and the nursing notes.  Pertinent labs & imaging results that were available during my care of the patient were reviewed by me and considered in my medical decision making (see chart for details).     Plan: 1. Test/x-ray results and diagnosis reviewed with patient 2. rx as per orders; risks, benefits, potential side effects reviewed with patient 3. Recommend supportive treatment with elevation and ice.  Patient was placed in a boot recommend that she use a walker to prevent pain with ambulation.  Appointment as soon as possible with Dr. Marry Guan for follow-up evaluation and treatment.  Provided her with a few tramadol for nighttime pain.  Is not able to take NSAIDs due to the concurrent use of the Plavix.  Patient was provided with a CD of her x-rays. 4. F/u prn if symptoms worsen or don't improve  Final Clinical Impressions(s) / UC Diagnoses   Final diagnoses:  Closed nondisplaced oblique fracture of shaft of left fibula, initial encounter     Discharge Instructions     Apply ice 20 minutes out of every 2 hours 4-5 times daily for comfort.  Elevate above your heart most of the time.  Arrange a follow-up appointment with Dr. Marry Guan as soon as possible. Use a walker to assist with ambulation and prevent pain    ED Prescriptions    Medication Sig Dispense Auth. Provider   traMADol (ULTRAM) 50 MG tablet Take 1 tablet (50 mg total) by mouth every 6 (six) hours as needed for up to 10 doses for severe pain. 10 tablet Lorin Picket, PA-C     Controlled Substance Prescriptions [place] Controlled Substance Registry consulted? Not Applicable   Lorin Picket, PA-C 01/11/18 1244

## 2018-01-11 NOTE — ED Triage Notes (Addendum)
Pt reports slipping on a muddy incline just PTA and injuring her left ankle and scraping up her left knee. Pain and swelling noted. Pain 5/10

## 2018-01-13 DIAGNOSIS — S8263XA Displaced fracture of lateral malleolus of unspecified fibula, initial encounter for closed fracture: Secondary | ICD-10-CM | POA: Insufficient documentation

## 2018-01-13 HISTORY — DX: Displaced fracture of lateral malleolus of unspecified fibula, initial encounter for closed fracture: S82.63XA

## 2018-03-16 ENCOUNTER — Ambulatory Visit: Payer: Medicare Other | Admitting: Family Medicine

## 2018-03-18 ENCOUNTER — Ambulatory Visit (INDEPENDENT_AMBULATORY_CARE_PROVIDER_SITE_OTHER): Payer: Medicare Other

## 2018-03-18 VITALS — BP 104/60 | HR 77 | Temp 97.7°F | Resp 12 | Ht 65.0 in | Wt 160.2 lb

## 2018-03-18 DIAGNOSIS — E2839 Other primary ovarian failure: Secondary | ICD-10-CM | POA: Diagnosis not present

## 2018-03-18 DIAGNOSIS — Z9181 History of falling: Secondary | ICD-10-CM

## 2018-03-18 DIAGNOSIS — Z0000 Encounter for general adult medical examination without abnormal findings: Secondary | ICD-10-CM

## 2018-03-18 NOTE — Progress Notes (Signed)
Subjective:   Kelly Ritter is a 84 y.o. female who presents for Medicare Annual (Subsequent) preventive examination.  Review of Systems:  N/A Cardiac Risk Factors include: advanced age (>29men, >77 women);dyslipidemia;hypertension;sedentary lifestyle     Objective:     Vitals: BP 104/60 (BP Location: Right Arm, Patient Position: Sitting, Cuff Size: Normal)   Pulse 77   Temp 97.7 F (36.5 C) (Oral)   Resp 12   Ht 5\' 5"  (1.651 m)   Wt 160 lb 3.2 oz (72.7 kg)   SpO2 96%   BMI 26.66 kg/m   Body mass index is 26.66 kg/m.  Advanced Directives 03/18/2018 01/11/2018 08/08/2017 05/06/2017 12/15/2016 03/14/2016 09/24/2015  Does Patient Have a Medical Advance Directive? No Yes Yes Yes Yes Yes Yes  Type of Advance Directive - [place];Living will - Living will;Healthcare Power of [place];Living will Living will [place]  Does patient want to make changes to medical advance directive? - - - - - - No - Patient declined  Copy of [place] in Chart? - - - - - - No - copy requested  Would patient like information on creating a medical advance directive? Yes (MAU/Ambulatory/Procedural Areas - Information given) - - - - - -    Tobacco Social History   Tobacco Use  Smoking Status Former Smoker  . Packs/day: 1.00  . Years: 8.00  . Pack years: 8.00  . Types: Cigarettes  . Last attempt to quit: 08/12/1989  . Years since quitting: 28.6  Smokeless Tobacco Former Systems developer  Tobacco Comment   smoking cessation materials not required     Counseling given: No Comment: smoking cessation materials not required  Clinical Intake:  Pre-visit preparation completed: Yes  Pain : No/denies pain   BMI - recorded: 26.66 Nutritional Status: BMI 25 -29 Overweight Nutritional Risks: None Diabetes: No  How often do you need to have someone help you when you read instructions, pamphlets, or other written materials from your  doctor or pharmacy?: 1 - Never  Interpreter Needed?: No  Information entered by :: AEversole, LPN  Past Medical History:  Diagnosis Date  . CAD (coronary artery disease)    s/p PCI and stent placement of circumflex and LAD and RCA.  restenosis of RCA 2014 with drug eluting stent  . Collagen vascular disease ([place])   . Hiatal hernia   . Hypercholesterolemia   . Hypertension   . Peripheral vascular disease [facility])    Past Surgical History:  Procedure Laterality Date  . ABDOMINAL HYSTERECTOMY  1979  . APPENDECTOMY  1956  . CARDIAC CATHETERIZATION N/A 09/25/2015   Procedure: Left Heart Cath and Coronary Angiography;  Surgeon: Teodoro Spray, MD;  Location: [facility];  Service: Cardiovascular;  Laterality: N/A;  . CAROTID STENT Left   . EYE SURGERY  2010   cataracts  . JOINT REPLACEMENT     knee replacement  . PERIPHERAL VASCULAR CATHETERIZATION Left 12/19/2014   Procedure: Carotid PTA/Stent Intervention;  Surgeon: Algernon Huxley, MD;  Location: [facility];  Service: Cardiovascular;  Laterality: Left;   Family History  Problem Relation Age of Onset  . Heart disease Mother   . Hypertension Brother   . Heart disease Brother 76       several MIs  . Heart disease Daughter 30       deceased from MI  . Heart disease Son    Social History  Socioeconomic History  . Marital status: Married    Spouse name: Not on file  . Number of children: 4  . Years of education: some college  . Highest education level: 12th grade  Occupational History  . Occupation: Retired  Scientific laboratory technician  . Financial resource strain: Not hard at all  . Food insecurity:    Worry: Never true    Inability: Never true  . Transportation needs:    Medical: No    Non-medical: No  Tobacco Use  . Smoking status: Former Smoker    Packs/day: 1.00    Years: 8.00    Pack years: 8.00    Types: Cigarettes    Last attempt to quit: 08/12/1989    Years since quitting: 28.6  . Smokeless tobacco: Former  Systems developer  . Tobacco comment: smoking cessation materials not required  Substance and Sexual Activity  . Alcohol use: Not Currently    Alcohol/week: 0.0 oz  . Drug use: Never  . Sexual activity: Yes    Birth control/protection: Post-menopausal  Lifestyle  . Physical activity:    Days per week: 0 days    Minutes per session: 0 min  . Stress: Not at all  Relationships  . Social connections:    Talks on phone: Patient refused    Gets together: Patient refused    Attends religious service: Patient refused    Active member of club or organization: Patient refused    Attends meetings of clubs or organizations: Patient refused    Relationship status: Married  Other Topics Concern  . Not on file  Social History Narrative  . Not on file    Outpatient Encounter Medications as of 03/18/2018  Medication Sig  . acetaminophen (TYLENOL) 650 MG CR tablet Take 650 mg by mouth at bedtime as needed for pain.   Marland Kitchen clopidogrel (PLAVIX) 75 MG tablet Take 75 mg by mouth daily.  . Cyanocobalamin (B-12) 500 MCG TABS Take 1 tablet by mouth daily.  Marland Kitchen lisinopril (PRINIVIL,ZESTRIL) 10 MG tablet Take 10 mg by mouth daily.  . nebivolol (BYSTOLIC) 5 MG tablet Take 5 mg by mouth daily.  . potassium gluconate 595 MG TABS tablet Take 595 mg by mouth at bedtime as needed (leg cramps).  . rosuvastatin (CRESTOR) 20 MG tablet Take 20 mg by mouth at bedtime.  . traMADol (ULTRAM) 50 MG tablet Take 1 tablet (50 mg total) by mouth every 6 (six) hours as needed for up to 10 doses for severe pain.   No facility-administered encounter medications on file as of 03/18/2018.     Activities of Daily Living In your present state of health, do you have any difficulty performing the following activities: 03/18/2018 08/13/2017  Hearing? N N  Comment denies hearing aids -  Vision? N N  Comment wears eyeglasses -  Difficulty concentrating or making decisions? N N  Walking or climbing stairs? Y N  Comment dyspnea -  Dressing or bathing?  N N  Doing errands, shopping? N N  Preparing Food and eating ? N -  Comment denies dentures -  Using the Toilet? N -  In the past six months, have you accidently leaked urine? Y -  Comment stress incontinence -  Do you have problems with loss of bowel control? N -  Managing your Medications? N -  Managing your Finances? N -  Housekeeping or managing your Housekeeping? N -  Some recent data might be hidden    Patient Care Team: Glean Hess, MD  as PCP - General (Internal Medicine) Corey Skains, MD as Consulting Physician (Cardiology) Dew, Erskine Squibb, MD as Consulting Physician (Vascular Surgery) Leim Fabry, MD as Consulting Physician (Orthopedic Surgery) Vladimir Crofts, MD as Consulting Physician (Neurology)    Assessment:   This is a routine wellness examination for [place].  Exercise Activities and Dietary recommendations Current Exercise Habits: The patient does not participate in regular exercise at present, Exercise limited by: None identified  Goals    . DIET - INCREASE WATER INTAKE     Recommend to drink at least 6-8 8oz glasses of water per day.       Fall Risk Fall Risk  03/18/2018 08/13/2017  Falls in the past year? Yes No  Comment slipped in the yard and broke ankle -  Number falls in past yr: 1 -  Injury with Fall? Yes -  Risk Factor Category  High Fall Risk -  Risk for fall due to : Impaired vision;Other (Comment);Impaired balance/gait;History of fall(s) -  Risk for fall due to: Comment wears eyeglasses; chronic ankle pain; syncope -  Follow up Falls evaluation completed;Education provided;Falls prevention discussed -   FALL RISK PREVENTION PERTAINING TO HOME: Is your home free of loose throw rugs in walkways, pet beds, electrical cords, etc? Yes Is there adequate lighting in your home to reduce risk of falls?  Yes Are there stairs in or around your home WITH handrails? No stairs  ASSISTIVE DEVICES UTILIZED TO PREVENT FALLS: Use of a cane, walker or  w/c? No Grab bars in the bathroom? Yes  Shower chair or a place to sit while bathing? Yes An elevated toilet seat or a handicapped toilet? Yes  Timed Get Up and Go Performed: Yes. Pt ambulated 10 feet within 10 sec. Gait slow, steady and without the use of an assistive device. No intervention required at this time. Fall risk prevention has been discussed.  Community Resource Referral:  [place] Referral not required at this time.   Depression Screen PHQ 2/9 Scores 03/18/2018 08/13/2017  PHQ - 2 Score 0 0  PHQ- 9 Score 0 3     Cognitive Function     6CIT Screen 03/18/2018  What Year? 0 points  What month? 0 points  What time? 0 points  Count back from 20 0 points  Months in reverse 0 points  Repeat phrase 0 points  Total Score 0    Immunization History  Administered Date(s) Administered  . Influenza, High Dose Seasonal PF 08/13/2017  . Influenza-Unspecified 05/10/2016  . Pneumococcal Conjugate-13 08/13/2017  . Tdap 09/16/2017  . Zoster 11/04/2014    Qualifies for Shingles Vaccine? Yes. Zostavax completed 11/04/14. Due for Shingrix. Education has been provided regarding the importance of this vaccine. Pt has been advised to call insurance company to determine out of pocket expense. Advised may also receive vaccine at local pharmacy or Health Dept. Verbalized acceptance and understanding.  Screening Tests Health Maintenance  Topic Date Due  . DEXA SCAN  03/13/2001  . INFLUENZA VACCINE  03/12/2018  . PNA vac Low Risk Adult (2 of 2 - PPSV23) 08/13/2018  . TETANUS/TDAP  09/17/2027    Cancer Screenings: Lung: Low Dose CT Chest recommended if Age 45-80 years, 30 pack-year currently smoking OR have quit w/in 15years. Patient does not qualify. Breast Screening: No longer required Bone Density/Dexa: No longer required Colorectal: No longer required  Additional Screenings: Hepatitis C Screening: Does not qualify    Plan:  I have personally reviewed and addressed the  Medicare Annual Wellness questionnaire and have noted the following in the patient's chart:  A. Medical and social history B. Use of alcohol, tobacco or illicit drugs  C. Current medications and supplements D. Functional ability and status E.  Nutritional status F.  Physical activity G. Advance directives H. List of other physicians I.  Hospitalizations, surgeries, and ER visits in previous 12 months J.  [place] such as hearing and vision if needed, cognitive and depression L. Referrals and appointments  In addition, I have reviewed and discussed with patient certain preventive protocols, quality metrics, and best practice recommendations. A written personalized care plan for preventive services as well as general preventive health recommendations were provided to patient.  Signed,  Aleatha Borer, LPN Nurse Health Advisor  MD Recommendations: Zostavax completed 11/04/14. Due for Shingrix. Education has been provided regarding the importance of this vaccine. Pt has been advised to call insurance company to determine out of pocket expense. Advised may also receive vaccine at local pharmacy or Health Dept. Verbalized acceptance and understanding.

## 2018-03-18 NOTE — Patient Instructions (Signed)
Kelly Ritter , Thank you for taking time to come for your Medicare Wellness Visit. I appreciate your ongoing commitment to your health goals. Please review the following plan we discussed and let me know if I can assist you in the future.   Screening recommendations/referrals: Colorectal Screening: No longer required Mammogram: No longer required Bone Density: Ordered today  Vision and Dental Exams: Recommended annual ophthalmology exams for early detection of glaucoma and other disorders of the eye Recommended annual dental exams for proper oral hygiene  Vaccinations: Influenza vaccine: Up to date Pneumococcal vaccine: Up to date Tdap vaccine: Up to date Shingles vaccine: Please call your insurance company to determine your out of pocket expense for the Shingrix vaccine. You may also receive this vaccine at your local pharmacy or Health Dept.    Advanced directives: Advance directive discussed with you today. I have provided a copy for you to complete at home and have notarized. Once this is complete please bring a copy in to our office so we can scan it into your chart.  Goals: Recommend to drink at least 6-8 8oz glasses of water per day.  Next appointment: Please schedule your Annual Wellness Visit with your Nurse Health Advisor in one year.  Preventive Care 70 Years and Older, Female Preventive care refers to lifestyle choices and visits with your health care provider that can promote health and wellness. What does preventive care include?  A yearly physical exam. This is also called an annual well check.  Dental exams once or twice a year.  Routine eye exams. Ask your health care provider how often you should have your eyes checked.  Personal lifestyle choices, including:  Daily care of your teeth and gums.  Regular physical activity.  Eating a healthy diet.  Avoiding tobacco and drug use.  Limiting alcohol use.  Practicing safe sex.  Taking low-dose aspirin every  day.  Taking vitamin and mineral supplements as recommended by your health care provider. What happens during an annual well check? The services and screenings done by your health care provider during your annual well check will depend on your age, overall health, lifestyle risk factors, and family history of disease. Counseling  Your health care provider may ask you questions about your:  Alcohol use.  Tobacco use.  Drug use.  Emotional well-being.  Home and relationship well-being.  Sexual activity.  Eating habits.  History of falls.  Memory and ability to understand (cognition).  Work and work Statistician.  Reproductive health. Screening  You may have the following tests or measurements:  Height, weight, and BMI.  Blood pressure.  Lipid and cholesterol levels. These may be checked every 5 years, or more frequently if you are over 28 years old.  Skin check.  Lung cancer screening. You may have this screening every year starting at age 60 if you have a 30-pack-year history of smoking and currently smoke or have quit within the past 15 years.  Fecal occult blood test (FOBT) of the stool. You may have this test every year starting at age 39.  Flexible sigmoidoscopy or colonoscopy. You may have a sigmoidoscopy every 5 years or a colonoscopy every 10 years starting at age 46.  Hepatitis C blood test.  Hepatitis B blood test.  Sexually transmitted disease (STD) testing.  Diabetes screening. This is done by checking your blood sugar (glucose) after you have not eaten for a while (fasting). You may have this done every 1-3 years.  Bone density scan. This is  done to screen for osteoporosis. You may have this done starting at age 71.  Mammogram. This may be done every 1-2 years. Talk to your health care provider about how often you should have regular mammograms. Talk with your health care provider about your test results, treatment options, and if necessary, the need  for more tests. Vaccines  Your health care provider may recommend certain vaccines, such as:  Influenza vaccine. This is recommended every year.  Tetanus, diphtheria, and acellular pertussis (Tdap, Td) vaccine. You may need a Td booster every 10 years.  Zoster vaccine. You may need this after age 53.  Pneumococcal 13-valent conjugate (PCV13) vaccine. One dose is recommended after age 15.  Pneumococcal polysaccharide (PPSV23) vaccine. One dose is recommended after age 17. Talk to your health care provider about which screenings and vaccines you need and how often you need them. This information is not intended to replace advice given to you by your health care provider. Make sure you discuss any questions you have with your health care provider. Document Released: 08/25/2015 Document Revised: 04/17/2016 Document Reviewed: 05/30/2015 Elsevier Interactive Patient Education  2017 [employer] Prevention in the Home Falls can cause injuries. They can happen to people of all ages. There are many things you can do to make your home safe and to help prevent falls. What can I do on the outside of my home?  Regularly fix the edges of walkways and driveways and fix any cracks.  Remove anything that might make you trip as you walk through a door, such as a raised step or threshold.  Trim any bushes or trees on the path to your home.  Use bright outdoor lighting.  Clear any walking paths of anything that might make someone trip, such as rocks or tools.  Regularly check to see if handrails are loose or broken. Make sure that both sides of any steps have handrails.  Any raised decks and porches should have guardrails on the edges.  Have any leaves, snow, or ice cleared regularly.  Use sand or salt on walking paths during winter.  Clean up any spills in your garage right away. This includes oil or grease spills. What can I do in the bathroom?  Use night lights.  Install grab bars  by the toilet and in the tub and shower. Do not use towel bars as grab bars.  Use non-skid mats or decals in the tub or shower.  If you need to sit down in the shower, use a plastic, non-slip stool.  Keep the floor dry. Clean up any water that spills on the floor as soon as it happens.  Remove soap buildup in the tub or shower regularly.  Attach bath mats securely with double-sided non-slip rug tape.  Do not have throw rugs and other things on the floor that can make you trip. What can I do in the bedroom?  Use night lights.  Make sure that you have a light by your bed that is easy to reach.  Do not use any sheets or blankets that are too big for your bed. They should not hang down onto the floor.  Have a firm chair that has side arms. You can use this for support while you get dressed.  Do not have throw rugs and other things on the floor that can make you trip. What can I do in the kitchen?  Clean up any spills right away.  Avoid walking on wet floors.  Keep  items that you use a lot in easy-to-reach places.  If you need to reach something above you, use a strong step stool that has a grab bar.  Keep electrical cords out of the way.  Do not use floor polish or wax that makes floors slippery. If you must use wax, use non-skid floor wax.  Do not have throw rugs and other things on the floor that can make you trip. What can I do with my stairs?  Do not leave any items on the stairs.  Make sure that there are handrails on both sides of the stairs and use them. Fix handrails that are broken or loose. Make sure that handrails are as long as the stairways.  Check any carpeting to make sure that it is firmly attached to the stairs. Fix any carpet that is loose or worn.  Avoid having throw rugs at the top or bottom of the stairs. If you do have throw rugs, attach them to the floor with carpet tape.  Make sure that you have a light switch at the top of the stairs and the  bottom of the stairs. If you do not have them, ask someone to add them for you. What else can I do to help prevent falls?  Wear shoes that:  Do not have high heels.  Have rubber bottoms.  Are comfortable and fit you well.  Are closed at the toe. Do not wear sandals.  If you use a stepladder:  Make sure that it is fully opened. Do not climb a closed stepladder.  Make sure that both sides of the stepladder are locked into place.  Ask someone to hold it for you, if possible.  Clearly mark and make sure that you can see:  Any grab bars or handrails.  First and last steps.  Where the edge of each step is.  Use tools that help you move around (mobility aids) if they are needed. These include:  Canes.  Walkers.  Scooters.  Crutches.  Turn on the lights when you go into a dark area. Replace any light bulbs as soon as they burn out.  Set up your furniture so you have a clear path. Avoid moving your furniture around.  If any of your floors are uneven, fix them.  If there are any pets around you, be aware of where they are.  Review your medicines with your doctor. Some medicines can make you feel dizzy. This can increase your chance of falling. Ask your doctor what other things that you can do to help prevent falls. This information is not intended to replace advice given to you by your health care provider. Make sure you discuss any questions you have with your health care provider. Document Released: 05/25/2009 Document Revised: 01/04/2016 Document Reviewed: 09/02/2014 Elsevier Interactive Patient Education  2017 [employer].

## 2018-03-24 ENCOUNTER — Ambulatory Visit (INDEPENDENT_AMBULATORY_CARE_PROVIDER_SITE_OTHER): Payer: Medicare Other | Admitting: Internal Medicine

## 2018-03-24 ENCOUNTER — Encounter: Payer: Self-pay | Admitting: Internal Medicine

## 2018-03-24 VITALS — BP 132/70 | HR 83 | Ht 65.0 in | Wt 161.0 lb

## 2018-03-24 DIAGNOSIS — I10 Essential (primary) hypertension: Secondary | ICD-10-CM | POA: Diagnosis not present

## 2018-03-24 DIAGNOSIS — E7800 Pure hypercholesterolemia, unspecified: Secondary | ICD-10-CM | POA: Diagnosis not present

## 2018-03-24 DIAGNOSIS — R7303 Prediabetes: Secondary | ICD-10-CM | POA: Diagnosis not present

## 2018-03-24 DIAGNOSIS — I471 Supraventricular tachycardia: Secondary | ICD-10-CM | POA: Diagnosis not present

## 2018-03-24 DIAGNOSIS — G44309 Post-traumatic headache, unspecified, not intractable: Secondary | ICD-10-CM

## 2018-03-24 DIAGNOSIS — M2550 Pain in unspecified joint: Secondary | ICD-10-CM

## 2018-03-24 NOTE — Progress Notes (Signed)
Date:  03/24/2018   Name:  Kelly Ritter   DOB:  1934-07-14   MRN:  [id number]   Chief Complaint: Hypertension Previous pt of Dr. Vicente Masson.  Diabetes  She presents for her follow-up diabetic visit. Diabetes type: pre-diabetic. Her disease course has been stable. Hypoglycemia symptoms include headaches. Pertinent negatives for hypoglycemia include no dizziness or tremors. Pertinent negatives for diabetes include no chest pain and no fatigue. Current diabetic treatment includes diet. An ACE inhibitor/angiotensin II receptor blocker is being taken.  Hypertension  This is a chronic problem. The problem is controlled. Associated symptoms include headaches. Pertinent negatives include no chest pain, palpitations or shortness of breath. Past treatments include ACE inhibitors and beta blockers.  Hyperlipidemia  This is a chronic problem. The problem is controlled. Pertinent negatives include no chest pain or shortness of breath. Current antihyperlipidemic treatment includes statins. The current treatment provides significant improvement of lipids.  CAD with Paroxismal SVT - followed by Cardiology.  Hx of CVA (TIA) as well as PTCA and stenting Post concussion headache - for the past 2 years since hit by a fork lift at [facility].  Has mild headache most days.  Not on any treatment but followed by Neurology.  Lab Results  Component Value Date   HGBA1C 5.9 (H) 08/13/2017     Review of Systems  Constitutional: Negative for chills, fatigue, fever and unexpected weight change.  Respiratory: Negative for cough, choking, shortness of breath and wheezing.   Cardiovascular: Negative for chest pain and palpitations.  Musculoskeletal: Positive for arthralgias and gait problem.  Neurological: Positive for headaches. Negative for dizziness, tremors, syncope and light-headedness.  Psychiatric/Behavioral: Negative for sleep disturbance.    Patient Active Problem List   Diagnosis Date Noted  . Closed fracture  of lateral malleolus 01/13/2018  . B12 deficiency 08/14/2017  . Prediabetes 08/13/2017  . Coronary artery disease 05/06/2017  . Pure hypercholesterolemia 05/06/2017  . SOBOE (shortness of breath on exertion) 02/26/2017  . Anxiety, generalized 02/03/2017  . Post-concussion headache 02/03/2017  . Elevated TSH 10/22/2016  . Hematuria, microscopic 10/22/2016  . Primary osteoarthritis of hand 01/09/2016  . Arthralgia of multiple sites 12/18/2015  . Chronic hand pain, right 12/18/2015  . Encounter for long-term (current) use of high-risk medication 12/18/2015  . RSD (reflex sympathetic dystrophy) 12/18/2015  . Chest pain 09/24/2015  . Personal history of other malignant neoplasm of skin 08/02/2015  . Benign essential hypertension 01/06/2015  . 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 CVA (cerebral infarction) 12/12/2014  . Bilateral carotid artery stenosis 10/21/2014  . Other allergic rhinitis 10/21/2014  . Paroxysmal supraventricular tachycardia ([place]) 07/05/2014    Allergies  Allergen Reactions  . Sulfa Antibiotics Itching, Swelling, Rash and Hives    Swelling    . Latex Rash, Hives and Other (See Comments)    Skin eruptions    . Baclofen Other (See Comments)    Muscle spasms and cramps in shoulder area  . Donepezil Other (See Comments)    Past Surgical History:  Procedure Laterality Date  . ABDOMINAL HYSTERECTOMY  1979  . APPENDECTOMY  1956  . CARDIAC CATHETERIZATION N/A 09/25/2015   Procedure: Left Heart Cath and Coronary Angiography;  Surgeon: Teodoro Spray, MD;  Location: [place] [facility];  Service: Cardiovascular;  Laterality: N/A;  . CAROTID STENT Left 2014  . EYE SURGERY  2010   cataracts  . JOINT REPLACEMENT     knee replacement  . PERIPHERAL VASCULAR CATHETERIZATION Left 12/19/2014   Procedure:  Carotid PTA/Stent Intervention;  Surgeon: Algernon Huxley, MD;  Location: [place] [facility];  Service: Cardiovascular;  Laterality: Left;    Social History   Tobacco  Use  . Smoking status: Former Smoker    Packs/day: 1.00    Years: 8.00    Pack years: 8.00    Types: Cigarettes    Last attempt to quit: 08/12/1989    Years since quitting: 28.6  . Smokeless tobacco: Former Systems developer  . Tobacco comment: smoking cessation materials not required  Substance Use Topics  . Alcohol use: Not Currently    Alcohol/week: 0.0 standard drinks  . Drug use: Never     Medication list has been reviewed and updated.  Current Meds  Medication Sig  . acetaminophen (TYLENOL) 650 MG CR tablet Take 650 mg by mouth at bedtime as needed for pain.   Marland Kitchen clopidogrel (PLAVIX) 75 MG tablet Take 75 mg by mouth daily.  . Cyanocobalamin (B-12) 500 MCG TABS Take 1 tablet by mouth daily.  Marland Kitchen lisinopril (PRINIVIL,ZESTRIL) 10 MG tablet Take 10 mg by mouth daily.  . nebivolol (BYSTOLIC) 5 MG tablet Take 5 mg by mouth daily.  . potassium gluconate 595 MG TABS tablet Take 595 mg by mouth at bedtime as needed (leg cramps).  . rosuvastatin (CRESTOR) 20 MG tablet Take 20 mg by mouth at bedtime.    PHQ 2/9 Scores 03/18/2018 08/13/2017  PHQ - 2 Score 0 0  PHQ- 9 Score 0 3    Physical Exam  Constitutional: She is oriented to person, place, and time. She appears well-developed. No distress.  HENT:  Head: Normocephalic and atraumatic.  Eyes: Pupils are equal, round, and reactive to light.  Neck: Normal range of motion. Neck supple.  Cardiovascular: Normal rate, regular rhythm and normal heart sounds.  Pulmonary/Chest: Effort normal. No stridor. No respiratory distress. She has no wheezes.  Musculoskeletal: Normal range of motion. She exhibits edema (trace bilaterally LE edema).       Feet:  Lymphadenopathy:    She has no cervical adenopathy.  Neurological: She is alert and oriented to person, place, and time.  Skin: Skin is warm and dry. No rash noted.  Psychiatric: She has a normal mood and affect. Her behavior is normal. Thought content normal.  Nursing note and vitals reviewed.   BP  132/70   Pulse 83   Ht 5\' 5"  (1.651 m)   Wt 161 lb (73 kg)   SpO2 98%   BMI 26.79 kg/m   Assessment and Plan: 1. Benign essential hypertension controlled - CBC with Differential/Platelet - Comprehensive metabolic panel - TSH  2. Prediabetes Check labs - Hemoglobin A1c  3. Pure hypercholesterolemia On statin medication from Cardiology - Lipid panel  4. Paroxysmal supraventricular tachycardia (HCC) controlled  5. Post-concussion headache Stable, followed by Neurology   No orders of the defined types were placed in this encounter.   Partially dictated using Editor, commissioning. Any errors are unintentional.  Halina Maidens, MD [facility]  03/24/2018

## 2018-03-25 LAB — CBC WITH DIFFERENTIAL/PLATELET
Basophils Absolute: 0.1 10*3/uL (ref 0.0–0.2)
Basos: 1 %
EOS (ABSOLUTE): 0.1 10*3/uL (ref 0.0–0.4)
EOS: 1 %
HEMATOCRIT: 40.4 % (ref 34.0–46.6)
Hemoglobin: 13.5 g/dL (ref 11.1–15.9)
Immature Grans (Abs): 0 10*3/uL (ref 0.0–0.1)
Immature Granulocytes: 0 %
LYMPHS ABS: 2.1 10*3/uL (ref 0.7–3.1)
Lymphs: 29 %
MCH: 31 pg (ref 26.6–33.0)
MCHC: 33.4 g/dL (ref 31.5–35.7)
MCV: 93 fL (ref 79–97)
MONOS ABS: 0.5 10*3/uL (ref 0.1–0.9)
Monocytes: 7 %
Neutrophils Absolute: 4.4 10*3/uL (ref 1.4–7.0)
Neutrophils: 62 %
Platelets: 229 10*3/uL (ref 150–450)
RBC: 4.36 x10E6/uL (ref 3.77–5.28)
RDW: 13.4 % (ref 12.3–15.4)
WBC: 7.1 10*3/uL (ref 3.4–10.8)

## 2018-03-25 LAB — COMPREHENSIVE METABOLIC PANEL
A/G RATIO: 1.9 (ref 1.2–2.2)
ALK PHOS: 95 IU/L (ref 39–117)
ALT: 11 IU/L (ref 0–32)
AST: 15 IU/L (ref 0–40)
Albumin: 4.5 g/dL (ref 3.5–4.7)
BUN/Creatinine Ratio: 28 (ref 12–28)
BUN: 23 mg/dL (ref 8–27)
Bilirubin Total: 0.3 mg/dL (ref 0.0–1.2)
CO2: 22 mmol/L (ref 20–29)
Calcium: 9.4 mg/dL (ref 8.7–10.3)
Chloride: 102 mmol/L (ref 96–106)
Creatinine, Ser: 0.83 mg/dL (ref 0.57–1.00)
GFR calc Af Amer: 76 mL/min/{1.73_m2} (ref >59)
GFR calc non Af Amer: 66 mL/min/{1.73_m2} (ref >59)
GLOBULIN, TOTAL: 2.4 g/dL (ref 1.5–4.5)
Glucose: 84 mg/dL (ref 65–99)
POTASSIUM: 4.2 mmol/L (ref 3.5–5.2)
SODIUM: 142 mmol/L (ref 134–144)
Total Protein: 6.9 g/dL (ref 6.0–8.5)

## 2018-03-25 LAB — LIPID PANEL
CHOL/HDL RATIO: 2.1 ratio (ref 0.0–4.4)
Cholesterol, Total: 143 mg/dL (ref 100–199)
HDL: 69 mg/dL (ref >39)
LDL Calculated: 51 mg/dL (ref 0–99)
Triglycerides: 115 mg/dL (ref 0–149)
VLDL Cholesterol Cal: 23 mg/dL (ref 5–40)

## 2018-03-25 LAB — HEMOGLOBIN A1C
ESTIMATED AVERAGE GLUCOSE: 123 mg/dL
HEMOGLOBIN A1C: 5.9 % — AB (ref 4.8–5.6)

## 2018-03-25 LAB — TSH: TSH: 5.19 u[IU]/mL — ABNORMAL HIGH (ref 0.450–4.500)

## 2018-03-28 LAB — T4: T4 TOTAL: 7.8 ug/dL (ref 4.5–12.0)

## 2018-03-28 LAB — SPECIMEN STATUS REPORT

## 2018-03-28 LAB — T3: T3 TOTAL: 112 ng/dL (ref 71–180)

## 2018-04-02 ENCOUNTER — Encounter: Payer: Self-pay | Admitting: Internal Medicine

## 2018-04-02 ENCOUNTER — Ambulatory Visit
Admission: RE | Admit: 2018-04-02 | Discharge: 2018-04-02 | Disposition: A | Discharge status: Home / Self Care | Payer: Medicare Other | Source: Ambulatory Visit | Attending: Internal Medicine | Admitting: Internal Medicine

## 2018-04-02 DIAGNOSIS — E2839 Other primary ovarian failure: Secondary | ICD-10-CM

## 2018-04-02 DIAGNOSIS — M810 Age-related osteoporosis without current pathological fracture: Secondary | ICD-10-CM | POA: Insufficient documentation

## 2018-05-08 ENCOUNTER — Encounter (INDEPENDENT_AMBULATORY_CARE_PROVIDER_SITE_OTHER): Payer: Medicare Other

## 2018-05-08 ENCOUNTER — Ambulatory Visit (INDEPENDENT_AMBULATORY_CARE_PROVIDER_SITE_OTHER): Payer: Medicare Other | Admitting: Vascular Surgery

## 2018-06-02 ENCOUNTER — Encounter: Payer: Self-pay | Admitting: Emergency Medicine

## 2018-06-02 ENCOUNTER — Ambulatory Visit
Admission: EM | Admit: 2018-06-02 | Discharge: 2018-06-02 | Disposition: A | Discharge status: Home / Self Care | Payer: Medicare Other | Attending: Family Medicine | Admitting: Family Medicine

## 2018-06-02 ENCOUNTER — Other Ambulatory Visit: Payer: Self-pay

## 2018-06-02 DIAGNOSIS — M5442 Lumbago with sciatica, left side: Secondary | ICD-10-CM

## 2018-06-02 DIAGNOSIS — I10 Essential (primary) hypertension: Secondary | ICD-10-CM | POA: Diagnosis not present

## 2018-06-02 MED ORDER — TRAMADOL HCL 50 MG PO TABS: 50.0000 mg | ORAL_TABLET | Freq: Three times a day (TID) | ORAL | 0 refills | Status: DC | PRN

## 2018-06-02 MED ORDER — METAXALONE 800 MG PO TABS: 800.0000 mg | ORAL_TABLET | Freq: Three times a day (TID) | ORAL | 0 refills | Status: DC | PRN

## 2018-06-02 NOTE — ED Triage Notes (Signed)
Pt c/o lower back pain, that radiates into her left buttock and leg. She has also had some left leg weakness and has almost fallen with the pain. Started about a week ago. She has seen her chiropractor and he has done several things and has not helped. She has not slept and is going through a lot emotionally.

## 2018-06-02 NOTE — ED Provider Notes (Signed)
[facility]    CSN: [id number] Arrival date & time: 06/02/18  [place]  History   Chief Complaint Chief Complaint  Patient presents with  . Back Pain   HPI   84 year old female with a complex past medical history presents with left low back pain.  Patient reports a one-week history of left low back pain.  She reports radiation down the left leg.  Patient states that she takes Tylenol without improvement.  She has also seen her chiropractor and has had multiple treatments and different modalities of treatment without improvement.  No reports of numbness or tingling.  No incontinence.  Pain is severe.  Interfering with sleep.  Seems to be worse with activity.  No relieving factors.  No other complaints.  PMH, Surgical Hx, Family Hx, Social History reviewed and updated as below.  Past Medical History:  Diagnosis Date  . CAD (coronary artery disease)    s/p PCI and stent placement of circumflex and LAD and RCA.  restenosis of RCA 2014 with drug eluting stent  . Collagen vascular disease ([place])   . Hiatal hernia   . Hypercholesterolemia   . Hypertension   . Peripheral vascular disease [facility])     Patient Active Problem List   Diagnosis Date Noted  . Osteoporosis of forearm 04/02/2018  . Closed fracture of lateral malleolus 01/13/2018  . B12 deficiency 08/14/2017  . Prediabetes 08/13/2017  . Coronary artery disease 05/06/2017  . Pure hypercholesterolemia 05/06/2017  . SOBOE (shortness of breath on exertion) 02/26/2017  . Anxiety, generalized 02/03/2017  . Post-concussion headache 02/03/2017  . Elevated TSH 10/22/2016  . Hematuria, microscopic 10/22/2016  . Primary osteoarthritis of hand 01/09/2016  . Arthralgia of multiple sites 12/18/2015  . Chronic hand pain, right 12/18/2015  . Encounter for long-term (current) use of high-risk medication 12/18/2015  . RSD (reflex sympathetic dystrophy) 12/18/2015  . Chest pain 09/24/2015  . Personal history of other malignant  neoplasm of skin 08/02/2015  . Benign essential hypertension 01/06/2015  . 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 CVA (cerebral infarction) 12/12/2014  . Bilateral carotid artery stenosis 10/21/2014  . Other allergic rhinitis 10/21/2014  . Paroxysmal supraventricular tachycardia ([place]) 07/05/2014    Past Surgical History:  Procedure Laterality Date  . ABDOMINAL HYSTERECTOMY  1979  . APPENDECTOMY  1956  . CARDIAC CATHETERIZATION N/A 09/25/2015   Procedure: Left Heart Cath and Coronary Angiography;  Surgeon: Teodoro Spray, MD;  Location: [place] [facility];  Service: Cardiovascular;  Laterality: N/A;  . CAROTID STENT Left 2014  . EYE SURGERY  2010   cataracts  . JOINT REPLACEMENT     knee replacement  . PERIPHERAL VASCULAR CATHETERIZATION Left 12/19/2014   Procedure: Carotid PTA/Stent Intervention;  Surgeon: Algernon Huxley, MD;  Location: [place] [facility];  Service: Cardiovascular;  Laterality: Left;    OB History   None      Home Medications    Prior to Admission medications   Medication Sig Start Date End Date Taking? Authorizing Provider  acetaminophen (TYLENOL) 650 MG CR tablet Take 650 mg by mouth at bedtime as needed for pain.    Yes [provider]  clopidogrel (PLAVIX) 75 MG tablet Take 75 mg by mouth daily.   Yes [provider]  Cyanocobalamin (B-12) 500 MCG TABS Take 1 tablet by mouth daily. 08/14/17  Yes Plonk, Gwyndolyn Saxon, MD  lisinopril (PRINIVIL,ZESTRIL) 10 MG tablet Take 10 mg by mouth daily.   Yes [provider]  potassium gluconate  595 MG TABS tablet Take 595 mg by mouth at bedtime as needed (leg cramps).   Yes [provider]  rosuvastatin (CRESTOR) 20 MG tablet Take 20 mg by mouth at bedtime.   Yes [provider]  metaxalone (SKELAXIN) 800 MG tablet Take 1 tablet (800 mg total) by mouth 3 (three) times daily as needed for muscle spasms. 06/02/18   Coral Spikes, DO  nebivolol (BYSTOLIC) 5 MG tablet Take 5 mg by  mouth daily.    [provider]  traMADol (ULTRAM) 50 MG tablet Take 1 tablet (50 mg total) by mouth every 8 (eight) hours as needed. 06/02/18   Coral Spikes, DO    Family History Family History  Problem Relation Age of Onset  . Heart disease Mother   . Hypertension Brother   . Heart disease Brother 2       several MIs  . Heart disease Daughter 72       deceased from MI  . Heart disease Son     Social History Social History   Tobacco Use  . Smoking status: Former Smoker    Packs/day: 1.00    Years: 8.00    Pack years: 8.00    Types: Cigarettes    Last attempt to quit: 08/12/1989    Years since quitting: 28.8  . Smokeless tobacco: Former Systems developer  . Tobacco comment: smoking cessation materials not required  Substance Use Topics  . Alcohol use: Not Currently    Alcohol/week: 0.0 standard drinks  . Drug use: Never     Allergies   Sulfa antibiotics; Latex; Baclofen; and Donepezil   Review of Systems Review of Systems  Genitourinary: Negative.   Musculoskeletal: Positive for back pain.   Physical Exam Triage Vital Signs ED Triage Vitals  Enc Vitals Group     BP 06/02/18 1859 (!) 159/69     Pulse Rate 06/02/18 1859 81     Resp 06/02/18 1859 17     Temp 06/02/18 1859 97.9 F (36.6 C)     Temp Source 06/02/18 1859 Oral     SpO2 06/02/18 1859 98 %     Weight 06/02/18 1856 156 lb (70.8 kg)     Height 06/02/18 1856 5\' 4"  (1.626 m)     Head Circumference --      Peak Flow --      Pain Score 06/02/18 1856 6     Pain Loc --      Pain Edu? --      Excl. in [place]? --    Updated Vital Signs BP (!) 159/69   Pulse 81   Temp 97.9 F (36.6 C) (Oral)   Resp 17   Ht 5\' 4"  (1.626 m)   Wt 70.8 kg   SpO2 98%   BMI 26.78 kg/m   Visual Acuity Right Eye Distance:   Left Eye Distance:   Bilateral Distance:    Right Eye Near:   Left Eye Near:    Bilateral Near:     Physical Exam  Constitutional: She is oriented to person, place, and time. She appears  well-developed. No distress.  HENT:  Head: Normocephalic and atraumatic.  Cardiovascular: Normal rate and regular rhythm.  Pulmonary/Chest: Effort normal and breath sounds normal. She has no wheezes. She has no rales.  Musculoskeletal:  Patient with tenderness of the left lumbar region.  Patient also has tenderness at the midpoint of the piriformis muscle.  Negative straight leg raise.  Neurological: She is alert and  oriented to person, place, and time.  Psychiatric:  Tearful/emotional.   Nursing note and vitals reviewed.  UC Treatments / Results  Labs (all labs ordered are listed, but only abnormal results are displayed) Labs Reviewed - No data to display  EKG None  Radiology No results found.  Procedures Procedures (including critical care time)  Medications Ordered in UC Medications - No data to display  Initial Impression / Assessment and Plan / UC Course  I have reviewed the triage vital signs and the nursing notes.  Pertinent labs & imaging results that were available during my care of the patient were reviewed by me and considered in my medical decision making (see chart for details).    84 year old female presents with low back pain.  Given her history, I am treating her with tramadol.  Skelaxin to be used as needed.  I prefer to use baclofen but patient is allergic.  Supportive care.  Final Clinical Impressions(s) / UC Diagnoses   Final diagnoses:  Acute left-sided low back pain with left-sided sciatica     Discharge Instructions     Rest.  Meds as prescribed.  Take care  Dr. Lacinda Axon    ED Prescriptions    Medication Sig Dispense Auth. Provider   metaxalone (SKELAXIN) 800 MG tablet Take 1 tablet (800 mg total) by mouth 3 (three) times daily as needed for muscle spasms. 21 tablet Aqua Denslow G, DO   traMADol (ULTRAM) 50 MG tablet Take 1 tablet (50 mg total) by mouth every 8 (eight) hours as needed. 15 tablet Coral Spikes, DO     Controlled Substance  Prescriptions [place] Controlled Substance Registry consulted? Not Applicable   Coral Spikes, DO 06/02/18 2033

## 2018-06-02 NOTE — Discharge Instructions (Signed)
Rest. ° °Meds as prescribed. ° °Take care ° °Dr. Kyle Stansell  °

## 2018-06-10 ENCOUNTER — Ambulatory Visit (INDEPENDENT_AMBULATORY_CARE_PROVIDER_SITE_OTHER): Payer: Medicare Other | Admitting: Internal Medicine

## 2018-06-10 ENCOUNTER — Encounter: Payer: Self-pay | Admitting: Internal Medicine

## 2018-06-10 VITALS — BP 132/78 | HR 96 | Temp 98.0°F | Ht 64.0 in | Wt 156.0 lb

## 2018-06-10 DIAGNOSIS — B029 Zoster without complications: Secondary | ICD-10-CM | POA: Diagnosis not present

## 2018-06-10 MED ORDER — VALACYCLOVIR HCL 1 G PO TABS: 1000.0000 mg | ORAL_TABLET | Freq: Three times a day (TID) | ORAL | 0 refills | Status: DC

## 2018-06-10 NOTE — Progress Notes (Signed)
Date:  06/10/2018   Name:  Kelly Ritter   DOB:  Dec 13, 1933   MRN:  [id number]   Chief Complaint: Rash (Started with back pains. Seen UC when started. Was given muscle relaxer and tramadol. Doing needle injections at chiropractor. Was told this morning could be shingles. Rash is itching but not painful. No fever.  )  Rash  This is a new problem. The current episode started yesterday. The affected locations include the torso. The rash is characterized by redness, burning and blistering. She was exposed to nothing. Pertinent negatives include no fatigue, fever or shortness of breath. Past treatments include nothing.  she is taking Tramadol and Skelaxin from UC for back pain which is improving with chiropractic care.  Review of Systems  Constitutional: Negative for fatigue and fever.  Respiratory: Negative for chest tightness and shortness of breath.   Cardiovascular: Negative for chest pain.  Musculoskeletal: Positive for back pain.  Skin: Positive for rash.  Psychiatric/Behavioral: Negative for sleep disturbance.    Patient Active Problem List   Diagnosis Date Noted  . Osteoporosis of forearm 04/02/2018  . Closed fracture of lateral malleolus 01/13/2018  . B12 deficiency 08/14/2017  . Prediabetes 08/13/2017  . Coronary artery disease 05/06/2017  . Pure hypercholesterolemia 05/06/2017  . SOBOE (shortness of breath on exertion) 02/26/2017  . Anxiety, generalized 02/03/2017  . Post-concussion headache 02/03/2017  . Elevated TSH 10/22/2016  . Hematuria, microscopic 10/22/2016  . Primary osteoarthritis of hand 01/09/2016  . Arthralgia of multiple sites 12/18/2015  . Chronic hand pain, right 12/18/2015  . Encounter for long-term (current) use of high-risk medication 12/18/2015  . RSD (reflex sympathetic dystrophy) 12/18/2015  . Chest pain 09/24/2015  . Personal history of other malignant neoplasm of skin 08/02/2015  . Benign essential hypertension 01/06/2015  . Carotid artery  obstruction 12/19/2014  . CVA (cerebral infarction) 12/12/2014  . Bilateral carotid artery stenosis 10/21/2014  . Other allergic rhinitis 10/21/2014  . Paroxysmal supraventricular tachycardia ([place]) 07/05/2014    Allergies  Allergen Reactions  . Sulfa Antibiotics Itching, Swelling, Rash and Hives    Swelling    . Latex Rash, Hives and Other (See Comments)    Skin eruptions    . Baclofen Other (See Comments)    Muscle spasms and cramps in shoulder area  . Donepezil Other (See Comments)    Past Surgical History:  Procedure Laterality Date  . ABDOMINAL HYSTERECTOMY  1979  . APPENDECTOMY  1956  . CARDIAC CATHETERIZATION N/A 09/25/2015   Procedure: Left Heart Cath and Coronary Angiography;  Surgeon: Teodoro Spray, MD;  Location: [place] [facility];  Service: Cardiovascular;  Laterality: N/A;  . CAROTID STENT Left 2014  . EYE SURGERY  2010   cataracts  . JOINT REPLACEMENT     knee replacement  . PERIPHERAL VASCULAR CATHETERIZATION Left 12/19/2014   Procedure: Carotid PTA/Stent Intervention;  Surgeon: Algernon Huxley, MD;  Location: [place] [facility];  Service: Cardiovascular;  Laterality: Left;    Social History   Tobacco Use  . Smoking status: Former Smoker    Packs/day: 1.00    Years: 8.00    Pack years: 8.00    Types: Cigarettes    Last attempt to quit: 08/12/1989    Years since quitting: 28.8  . Smokeless tobacco: Former Systems developer  . Tobacco comment: smoking cessation materials not required  Substance Use Topics  . Alcohol use: Not Currently    Alcohol/week: 0.0 standard drinks  . Drug use: Never  Medication list has been reviewed and updated.  Current Meds  Medication Sig  . acetaminophen (TYLENOL) 650 MG CR tablet Take 650 mg by mouth at bedtime as needed for pain.   Marland Kitchen clopidogrel (PLAVIX) 75 MG tablet Take 75 mg by mouth daily.  . Cyanocobalamin (B-12) 500 MCG TABS Take 1 tablet by mouth daily.  Marland Kitchen lisinopril (PRINIVIL,ZESTRIL) 10 MG tablet Take 10 mg by mouth  daily.  . metaxalone (SKELAXIN) 800 MG tablet Take 1 tablet (800 mg total) by mouth 3 (three) times daily as needed for muscle spasms.  . nebivolol (BYSTOLIC) 5 MG tablet Take 5 mg by mouth daily.  . potassium gluconate 595 MG TABS tablet Take 595 mg by mouth at bedtime as needed (leg cramps).  . rosuvastatin (CRESTOR) 20 MG tablet Take 20 mg by mouth at bedtime.  . traMADol (ULTRAM) 50 MG tablet Take 1 tablet (50 mg total) by mouth every 8 (eight) hours as needed.    PHQ 2/9 Scores 03/18/2018 08/13/2017  PHQ - 2 Score 0 0  PHQ- 9 Score 0 3    Physical Exam  Constitutional: She is oriented to person, place, and time. She appears well-developed. No distress.  HENT:  Head: Normocephalic and atraumatic.  Neck: Normal range of motion. Neck supple.  Cardiovascular: Normal rate, regular rhythm and normal heart sounds.  Pulmonary/Chest: Effort normal and breath sounds normal. No respiratory distress.  Musculoskeletal: Normal range of motion.  Neurological: She is alert and oriented to person, place, and time.  Skin: Skin is warm and dry. No rash noted.     Psychiatric: She has a normal mood and affect. Her behavior is normal. Thought content normal.  Nursing note and vitals reviewed.   BP 132/78 (BP Location: Right Arm, Patient Position: Sitting, Cuff Size: Normal)   Pulse 96   Temp 98 F (36.7 C) (Oral)   Ht 5\' 4"  (1.626 m)   Wt 156 lb (70.8 kg)   SpO2 97%   BMI 26.78 kg/m   Assessment and Plan: 1. Herpes zoster without complication Avoid local irritation Continue tramadol for pain - valACYclovir (VALTREX) 1000 MG tablet; Take 1 tablet (1,000 mg total) by mouth 3 (three) times daily.  Dispense: 21 tablet; Refill: 0   Partially dictated using Editor, commissioning. Any errors are unintentional.  Halina Maidens, MD [facility]  06/10/2018

## 2018-06-18 ENCOUNTER — Ambulatory Visit: Payer: Medicare Other | Admitting: Internal Medicine

## 2018-06-18 ENCOUNTER — Encounter: Payer: Self-pay | Admitting: Internal Medicine

## 2018-06-18 VITALS — BP 138/82 | HR 82 | Resp 16 | Ht 64.0 in | Wt 156.0 lb

## 2018-06-18 DIAGNOSIS — I10 Essential (primary) hypertension: Secondary | ICD-10-CM

## 2018-06-18 DIAGNOSIS — B029 Zoster without complications: Secondary | ICD-10-CM | POA: Diagnosis not present

## 2018-06-18 DIAGNOSIS — M533 Sacrococcygeal disorders, not elsewhere classified: Secondary | ICD-10-CM | POA: Diagnosis not present

## 2018-06-18 MED ORDER — TRAMADOL HCL 50 MG PO TABS: 50.0000 mg | ORAL_TABLET | Freq: Four times a day (QID) | ORAL | 0 refills | Status: DC | PRN

## 2018-06-18 MED ORDER — METAXALONE 800 MG PO TABS: 800.0000 mg | ORAL_TABLET | Freq: Every day | ORAL | 2 refills | Status: DC

## 2018-06-18 NOTE — Progress Notes (Signed)
Date:  06/18/2018   Name:  Kelly Ritter   DOB:  Jun 09, 1934   MRN:  [id number]   Chief Complaint: Herpes Zoster (follow up) and Irregular Heart Beat (Many stents and had appt with Dr. Lucky Cowboy but cancelled it due to husbands illness.Marland KitchenMarland KitchenMarland KitchenWill call and get new appt for the irregular heart i heard and she feels all of sudden. )  Rash  This is a new problem. The current episode started 1 to 4 weeks ago. The problem has been gradually improving since onset. The affected locations include the torso.  Back Pain  This is a new problem. The current episode started 1 to 4 weeks ago. The problem occurs daily. The problem is unchanged. The pain is present in the sacro-iliac. The quality of the pain is described as aching and cramping. The pain radiates to the left knee. The pain is moderate. The pain is worse during the night. The symptoms are aggravated by bending and twisting. Pertinent negatives include no bladder incontinence or bowel incontinence. She has tried chiropractic manipulation, NSAIDs, muscle relaxant and analgesics for the symptoms.    Review of Systems  Gastrointestinal: Negative for bowel incontinence.  Genitourinary: Negative for bladder incontinence.  Musculoskeletal: Positive for back pain.  Skin: Positive for rash.    Patient Active Problem List   Diagnosis Date Noted  . Osteoporosis of forearm 04/02/2018  . Closed fracture of lateral malleolus 01/13/2018  . B12 deficiency 08/14/2017  . Prediabetes 08/13/2017  . Coronary artery disease 05/06/2017  . Pure hypercholesterolemia 05/06/2017  . SOBOE (shortness of breath on exertion) 02/26/2017  . Anxiety, generalized 02/03/2017  . Post-concussion headache 02/03/2017  . Elevated TSH 10/22/2016  . Hematuria, microscopic 10/22/2016  . Primary osteoarthritis of hand 01/09/2016  . Arthralgia of multiple sites 12/18/2015  . Chronic hand pain, right 12/18/2015  . Encounter for long-term (current) use of high-risk medication  12/18/2015  . RSD (reflex sympathetic dystrophy) 12/18/2015  . Chest pain 09/24/2015  . Personal history of other malignant neoplasm of skin 08/02/2015  . Benign essential hypertension 01/06/2015  . 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 CVA (cerebral infarction) 12/12/2014  . Bilateral carotid artery stenosis 10/21/2014  . Other allergic rhinitis 10/21/2014  . Paroxysmal supraventricular tachycardia ([place]) 07/05/2014    Allergies  Allergen Reactions  . Sulfa Antibiotics Itching, Swelling, Rash and Hives    Swelling    . Latex Rash, Hives and Other (See Comments)    Skin eruptions    . Baclofen Other (See Comments)    Muscle spasms and cramps in shoulder area  . Donepezil Other (See Comments)    Past Surgical History:  Procedure Laterality Date  . ABDOMINAL HYSTERECTOMY  1979  . APPENDECTOMY  1956  . CARDIAC CATHETERIZATION N/A 09/25/2015   Procedure: Left Heart Cath and Coronary Angiography;  Surgeon: Teodoro Spray, MD;  Location: [place] [facility];  Service: Cardiovascular;  Laterality: N/A;  . CAROTID STENT Left 2014  . EYE SURGERY  2010   cataracts  . JOINT REPLACEMENT     knee replacement  . PERIPHERAL VASCULAR CATHETERIZATION Left 12/19/2014   Procedure: Carotid PTA/Stent Intervention;  Surgeon: Algernon Huxley, MD;  Location: [facility];  Service: Cardiovascular;  Laterality: Left;    Social History   Tobacco Use  . Smoking status: Former Smoker    Packs/day: 1.00    Years: 8.00    Pack years: 8.00    Types: Cigarettes    Last attempt to  quit: 08/12/1989    Years since quitting: 28.8  . Smokeless tobacco: Former Systems developer  . Tobacco comment: smoking cessation materials not required  Substance Use Topics  . Alcohol use: Not Currently    Alcohol/week: 0.0 standard drinks  . Drug use: Never     Medication list has been reviewed and updated.  Current Meds  Medication Sig  . acetaminophen (TYLENOL) 650 MG CR tablet Take 650 mg by mouth at bedtime as  needed for pain.   Marland Kitchen clopidogrel (PLAVIX) 75 MG tablet Take 75 mg by mouth daily.  . Cyanocobalamin (B-12) 500 MCG TABS Take 1 tablet by mouth daily.  Marland Kitchen lisinopril (PRINIVIL,ZESTRIL) 10 MG tablet Take 10 mg by mouth daily.  . metaxalone (SKELAXIN) 800 MG tablet Take 1 tablet (800 mg total) by mouth at bedtime.  . nebivolol (BYSTOLIC) 5 MG tablet Take 5 mg by mouth daily.  . potassium gluconate 595 MG TABS tablet Take 595 mg by mouth at bedtime as needed (leg cramps).  . rosuvastatin (CRESTOR) 20 MG tablet Take 20 mg by mouth at bedtime.  . traMADol (ULTRAM) 50 MG tablet Take 1 tablet (50 mg total) by mouth every 6 (six) hours as needed.  . valACYclovir (VALTREX) 1000 MG tablet Take 1 tablet (1,000 mg total) by mouth 3 (three) times daily.  . [DISCONTINUED] metaxalone (SKELAXIN) 800 MG tablet Take 1 tablet (800 mg total) by mouth 3 (three) times daily as needed for muscle spasms.  . [DISCONTINUED] traMADol (ULTRAM) 50 MG tablet Take 1 tablet (50 mg total) by mouth every 8 (eight) hours as needed.    PHQ 2/9 Scores 03/18/2018 08/13/2017  PHQ - 2 Score 0 0  PHQ- 9 Score 0 3    Physical Exam  Constitutional: She is oriented to person, place, and time. She appears well-developed. No distress.  HENT:  Head: Normocephalic and atraumatic.  Cardiovascular: Normal rate, regular rhythm and normal heart sounds.  Pulmonary/Chest: Effort normal and breath sounds normal. No respiratory distress.  Musculoskeletal: Normal range of motion.       Lumbar back: She exhibits tenderness (over SI region). She exhibits no bony tenderness and no spasm.  Neurological: She is alert and oriented to person, place, and time.  Skin: Skin is warm and dry. No rash noted.     Psychiatric: She has a normal mood and affect. Her behavior is normal. Thought content normal.  Nursing note and vitals reviewed.   BP 138/82   Pulse 82   Resp 16   Ht 5\' 4"  (1.626 m)   Wt 156 lb (70.8 kg)   SpO2 97%   BMI 26.78 kg/m    Assessment and Plan: 1. Sacro-iliac pain Continue Chiropractic manipulation Continue topical rubs - traMADol (ULTRAM) 50 MG tablet; Take 1 tablet (50 mg total) by mouth every 6 (six) hours as needed.  Dispense: 28 tablet; Refill: 0 - metaxalone (SKELAXIN) 800 MG tablet; Take 1 tablet (800 mg total) by mouth at bedtime.  Dispense: 30 tablet; Refill: 2  2. Herpes zoster without complication resolved  3. Benign essential hypertension controlled

## 2018-06-19 ENCOUNTER — Emergency Department: Payer: Medicare Other

## 2018-06-19 ENCOUNTER — Inpatient Hospital Stay
Admission: EM | Admit: 2018-06-19 | Discharge: 2018-06-23 | DRG: 661 | Disposition: A | Discharge status: Home / Self Care | Payer: Medicare Other | Attending: Internal Medicine | Admitting: Internal Medicine

## 2018-06-19 ENCOUNTER — Other Ambulatory Visit: Payer: Self-pay

## 2018-06-19 DIAGNOSIS — I10 Essential (primary) hypertension: Secondary | ICD-10-CM | POA: Diagnosis present

## 2018-06-19 DIAGNOSIS — Z87891 Personal history of nicotine dependence: Secondary | ICD-10-CM

## 2018-06-19 DIAGNOSIS — Z79899 Other long term (current) drug therapy: Secondary | ICD-10-CM | POA: Diagnosis not present

## 2018-06-19 DIAGNOSIS — N136 Pyonephrosis: Secondary | ICD-10-CM | POA: Diagnosis not present

## 2018-06-19 DIAGNOSIS — Z888 Allergy status to other drugs, medicaments and biological substances status: Secondary | ICD-10-CM

## 2018-06-19 DIAGNOSIS — Z7902 Long term (current) use of antithrombotics/antiplatelets: Secondary | ICD-10-CM | POA: Diagnosis not present

## 2018-06-19 DIAGNOSIS — I2510 Atherosclerotic heart disease of native coronary artery without angina pectoris: Secondary | ICD-10-CM | POA: Diagnosis present

## 2018-06-19 DIAGNOSIS — N12 Tubulo-interstitial nephritis, not specified as acute or chronic: Secondary | ICD-10-CM | POA: Diagnosis not present

## 2018-06-19 DIAGNOSIS — Z91040 Latex allergy status: Secondary | ICD-10-CM

## 2018-06-19 DIAGNOSIS — E785 Hyperlipidemia, unspecified: Secondary | ICD-10-CM | POA: Diagnosis present

## 2018-06-19 DIAGNOSIS — E7800 Pure hypercholesterolemia, unspecified: Secondary | ICD-10-CM | POA: Diagnosis present

## 2018-06-19 DIAGNOSIS — Z66 Do not resuscitate: Secondary | ICD-10-CM | POA: Diagnosis present

## 2018-06-19 DIAGNOSIS — I739 Peripheral vascular disease, unspecified: Secondary | ICD-10-CM | POA: Diagnosis present

## 2018-06-19 DIAGNOSIS — K449 Diaphragmatic hernia without obstruction or gangrene: Secondary | ICD-10-CM | POA: Diagnosis present

## 2018-06-19 DIAGNOSIS — Z882 Allergy status to sulfonamides status: Secondary | ICD-10-CM

## 2018-06-19 DIAGNOSIS — Z8249 Family history of ischemic heart disease and other diseases of the circulatory system: Secondary | ICD-10-CM

## 2018-06-19 DIAGNOSIS — Z96659 Presence of unspecified artificial knee joint: Secondary | ICD-10-CM | POA: Diagnosis present

## 2018-06-19 DIAGNOSIS — I722 Aneurysm of renal artery: Secondary | ICD-10-CM | POA: Diagnosis present

## 2018-06-19 DIAGNOSIS — N1330 Unspecified hydronephrosis: Secondary | ICD-10-CM | POA: Diagnosis not present

## 2018-06-19 HISTORY — DX: Tubulo-interstitial nephritis, not specified as acute or chronic: N12

## 2018-06-19 LAB — CBC
HCT: 38.9 % (ref 36.0–46.0)
HEMOGLOBIN: 12.8 g/dL (ref 12.0–15.0)
MCH: 31.7 pg (ref 26.0–34.0)
MCHC: 32.9 g/dL (ref 30.0–36.0)
MCV: 96.3 fL (ref 80.0–100.0)
PLATELETS: 201 10*3/uL (ref 150–400)
RBC: 4.04 MIL/uL (ref 3.87–5.11)
RDW: 12.2 % (ref 11.5–15.5)
WBC: 8.2 10*3/uL (ref 4.0–10.5)
nRBC: 0 % (ref 0.0–0.2)

## 2018-06-19 LAB — BASIC METABOLIC PANEL
ANION GAP: 9 (ref 5–15)
BUN: 15 mg/dL (ref 8–23)
CALCIUM: 9 mg/dL (ref 8.9–10.3)
CO2: 24 mmol/L (ref 22–32)
Chloride: 106 mmol/L (ref 98–111)
Creatinine, Ser: 0.73 mg/dL (ref 0.44–1.00)
Glucose, Bld: 136 mg/dL — ABNORMAL HIGH (ref 70–99)
POTASSIUM: 3.8 mmol/L (ref 3.5–5.1)
SODIUM: 139 mmol/L (ref 135–145)

## 2018-06-19 LAB — URINALYSIS, COMPLETE (UACMP) WITH MICROSCOPIC
BACTERIA UA: NONE SEEN
Bilirubin Urine: NEGATIVE
Glucose, UA: NEGATIVE mg/dL
KETONES UR: 20 mg/dL — AB
LEUKOCYTES UA: NEGATIVE
Nitrite: NEGATIVE
PROTEIN: 100 mg/dL — AB
Specific Gravity, Urine: 1.019 (ref 1.005–1.030)
pH: 5 (ref 5.0–8.0)

## 2018-06-19 MED ORDER — SODIUM CHLORIDE 0.9 % IV SOLN
1.0000 g | INTRAVENOUS | Status: DC
  Administered 2018-06-20 – 2018-06-21 (×2): 1 g via INTRAVENOUS
  Filled 2018-06-19 (×2): qty 1
  Filled 2018-06-19: qty 10

## 2018-06-19 MED ORDER — ALUM & MAG HYDROXIDE-SIMETH 200-200-20 MG/5ML PO SUSP
30.0000 mL | ORAL | Status: DC | PRN
  Administered 2018-06-19 – 2018-06-22 (×3): 30 mL via ORAL
  Filled 2018-06-19 (×3): qty 30

## 2018-06-19 MED ORDER — ONDANSETRON HCL 4 MG/2ML IJ SOLN
4.0000 mg | Freq: Four times a day (QID) | INTRAMUSCULAR | Status: DC | PRN
  Administered 2018-06-20: 07:00:00 4 mg via INTRAVENOUS
  Filled 2018-06-19: qty 2

## 2018-06-19 MED ORDER — ONDANSETRON HCL 4 MG/2ML IJ SOLN
4.0000 mg | Freq: Once | INTRAMUSCULAR | Status: AC
  Administered 2018-06-19: 4 mg via INTRAVENOUS
  Filled 2018-06-19: qty 2

## 2018-06-19 MED ORDER — CLOPIDOGREL BISULFATE 75 MG PO TABS
75.0000 mg | ORAL_TABLET | Freq: Every day | ORAL | Status: DC
  Administered 2018-06-20: 10:00:00 75 mg via ORAL
  Filled 2018-06-19: qty 1

## 2018-06-19 MED ORDER — OXYCODONE HCL 5 MG PO TABS
5.0000 mg | ORAL_TABLET | ORAL | Status: DC | PRN
  Administered 2018-06-20 (×2): 5 mg via ORAL
  Filled 2018-06-19 (×2): qty 1

## 2018-06-19 MED ORDER — ACETAMINOPHEN 650 MG RE SUPP: 650.0000 mg | Freq: Four times a day (QID) | RECTAL | Status: DC | PRN

## 2018-06-19 MED ORDER — CEFTRIAXONE SODIUM 1 G IJ SOLR
1.0000 g | Freq: Once | INTRAMUSCULAR | Status: AC
  Administered 2018-06-19: 1 g via INTRAVENOUS
  Filled 2018-06-19: qty 10

## 2018-06-19 MED ORDER — LISINOPRIL 10 MG PO TABS
10.0000 mg | ORAL_TABLET | Freq: Every day | ORAL | Status: DC
  Administered 2018-06-19 – 2018-06-22 (×4): 10 mg via ORAL
  Filled 2018-06-19 (×4): qty 1

## 2018-06-19 MED ORDER — MORPHINE SULFATE (PF) 2 MG/ML IV SOLN
2.0000 mg | INTRAVENOUS | Status: DC | PRN
  Administered 2018-06-20: 2 mg via INTRAVENOUS
  Filled 2018-06-19: qty 1

## 2018-06-19 MED ORDER — MORPHINE SULFATE (PF) 4 MG/ML IV SOLN
4.0000 mg | Freq: Once | INTRAVENOUS | Status: AC
  Administered 2018-06-19: 4 mg via INTRAVENOUS
  Filled 2018-06-19: qty 1

## 2018-06-19 MED ORDER — NEBIVOLOL HCL 5 MG PO TABS
5.0000 mg | ORAL_TABLET | Freq: Every day | ORAL | Status: DC
  Administered 2018-06-19 – 2018-06-23 (×5): 5 mg via ORAL
  Filled 2018-06-19 (×7): qty 1

## 2018-06-19 MED ORDER — ROSUVASTATIN CALCIUM 20 MG PO TABS
20.0000 mg | ORAL_TABLET | Freq: Every day | ORAL | Status: DC
  Administered 2018-06-19 – 2018-06-22 (×3): 20 mg via ORAL
  Filled 2018-06-19 (×3): qty 1

## 2018-06-19 MED ORDER — ACETAMINOPHEN 325 MG PO TABS: 650.0000 mg | ORAL_TABLET | Freq: Four times a day (QID) | ORAL | Status: DC | PRN

## 2018-06-19 MED ORDER — SODIUM CHLORIDE 0.9 % IV SOLN
INTRAVENOUS | Status: DC
  Administered 2018-06-19 – 2018-06-23 (×6): via INTRAVENOUS

## 2018-06-19 MED ORDER — ONDANSETRON HCL 4 MG PO TABS: 4.0000 mg | ORAL_TABLET | Freq: Four times a day (QID) | ORAL | Status: DC | PRN

## 2018-06-19 MED ORDER — KETOROLAC TROMETHAMINE 30 MG/ML IJ SOLN
15.0000 mg | Freq: Once | INTRAMUSCULAR | Status: AC
  Administered 2018-06-19: 15 mg via INTRAVENOUS
  Filled 2018-06-19: qty 1

## 2018-06-19 NOTE — ED Provider Notes (Signed)
[facility] Emergency Department Provider Note   ____________________________________________    I have reviewed the triage vital signs and the nursing notes.   HISTORY  Chief Complaint Flank Pain and Dizziness     HPI Kelly Ritter is a 84 y.o. female who presents with complaints of left flank pain.  Patient reports dysuria over the last 2 days, gradually worsening flank pain which has become moderate to severe today.  She describes mild nausea.  Denies fevers or chills.  No history of kidney stones.  Has not taken anything for this.  Review of records demonstrates seen for back pain several weeks ago   Past Medical History:  Diagnosis Date  . CAD (coronary artery disease)    s/p PCI and stent placement of circumflex and LAD and RCA.  restenosis of RCA 2014 with drug eluting stent  . Collagen vascular disease ([place])   . Hiatal hernia   . Hypercholesterolemia   . Hypertension   . Peripheral vascular disease [facility])     Patient Active Problem List   Diagnosis Date Noted  . Osteoporosis of forearm 04/02/2018  . Closed fracture of lateral malleolus 01/13/2018  . B12 deficiency 08/14/2017  . Prediabetes 08/13/2017  . Coronary artery disease 05/06/2017  . Pure hypercholesterolemia 05/06/2017  . SOBOE (shortness of breath on exertion) 02/26/2017  . Anxiety, generalized 02/03/2017  . Post-concussion headache 02/03/2017  . Elevated TSH 10/22/2016  . Hematuria, microscopic 10/22/2016  . Primary osteoarthritis of hand 01/09/2016  . Arthralgia of multiple sites 12/18/2015  . Chronic hand pain, right 12/18/2015  . Encounter for long-term (current) use of high-risk medication 12/18/2015  . RSD (reflex sympathetic dystrophy) 12/18/2015  . Chest pain 09/24/2015  . Personal history of other malignant neoplasm of skin 08/02/2015  . Benign essential hypertension 01/06/2015  . 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 CVA (cerebral infarction) 12/12/2014    . Bilateral carotid artery stenosis 10/21/2014  . Other allergic rhinitis 10/21/2014  . Paroxysmal supraventricular tachycardia ([place]) 07/05/2014    Past Surgical History:  Procedure Laterality Date  . ABDOMINAL HYSTERECTOMY  1979  . APPENDECTOMY  1956  . CARDIAC CATHETERIZATION N/A 09/25/2015   Procedure: Left Heart Cath and Coronary Angiography;  Surgeon: Teodoro Spray, MD;  Location: [place] [facility];  Service: Cardiovascular;  Laterality: N/A;  . CAROTID STENT Left 2014  . EYE SURGERY  2010   cataracts  . JOINT REPLACEMENT     knee replacement  . PERIPHERAL VASCULAR CATHETERIZATION Left 12/19/2014   Procedure: Carotid PTA/Stent Intervention;  Surgeon: Algernon Huxley, MD;  Location: [place] [facility];  Service: Cardiovascular;  Laterality: Left;    Prior to Admission medications   Medication Sig Start Date End Date Taking? Authorizing Provider  acetaminophen (TYLENOL) 650 MG CR tablet Take 650 mg by mouth at bedtime as needed for pain.     [provider]  clopidogrel (PLAVIX) 75 MG tablet Take 75 mg by mouth daily.    [provider]  Cyanocobalamin (B-12) 500 MCG TABS Take 1 tablet by mouth daily. 08/14/17   Plonk, Gwyndolyn Saxon, MD  lisinopril (PRINIVIL,ZESTRIL) 10 MG tablet Take 10 mg by mouth daily.    [provider]  metaxalone (SKELAXIN) 800 MG tablet Take 1 tablet (800 mg total) by mouth at bedtime. 06/18/18   Glean Hess, MD  nebivolol (BYSTOLIC) 5 MG tablet Take 5 mg by mouth daily.    [provider]  potassium gluconate 595 MG  TABS tablet Take 595 mg by mouth at bedtime as needed (leg cramps).    [provider]  rosuvastatin (CRESTOR) 20 MG tablet Take 20 mg by mouth at bedtime.    [provider]  traMADol (ULTRAM) 50 MG tablet Take 1 tablet (50 mg total) by mouth every 6 (six) hours as needed. 06/18/18   Glean Hess, MD  valACYclovir (VALTREX) 1000 MG tablet Take 1 tablet (1,000 mg total) by mouth 3 (three)  times daily. 06/10/18   Glean Hess, MD     Allergies Sulfa antibiotics; Latex; Baclofen; and Donepezil  Family History  Problem Relation Age of Onset  . Heart disease Mother   . Hypertension Brother   . Heart disease Brother 21       several MIs  . Heart disease Daughter 67       deceased from MI  . Heart disease Son     Social History Social History   Tobacco Use  . Smoking status: Former Smoker    Packs/day: 1.00    Years: 8.00    Pack years: 8.00    Types: Cigarettes    Last attempt to quit: 08/12/1989    Years since quitting: 28.8  . Smokeless tobacco: Former Systems developer  . Tobacco comment: smoking cessation materials not required  Substance Use Topics  . Alcohol use: Not Currently    Alcohol/week: 0.0 standard drinks  . Drug use: Never    Review of Systems  Constitutional: No fevers Eyes: No visual changes.  ENT: No sore throat. Cardiovascular: Denies chest pain. Respiratory: Denies shortness of breath. Gastrointestinal: As above Genitourinary: Dysuria and frequency Musculoskeletal: Negative for back pain. Skin: Negative for rash. Neurological: Negative for headaches  ____________________________________________   PHYSICAL EXAM:  VITAL SIGNS: ED Triage Vitals  Enc Vitals Group     BP 06/19/18 1322 (!) 192/78     Pulse Rate 06/19/18 1322 82     Resp 06/19/18 1322 16     Temp 06/19/18 1322 (!) 97.2 F (36.2 C)     Temp Source 06/19/18 1322 Oral     SpO2 06/19/18 1320 99 %     Weight 06/19/18 1322 70 kg (154 lb 5.2 oz)     Height 06/19/18 1322 1.626 m (5\' 4" )     Head Circumference --      Peak Flow --      Pain Score 06/19/18 1320 10     Pain Loc --      Pain Edu? --      Excl. in [place]? --     Constitutional: Alert and oriented.  Eyes: Conjunctivae are normal.   Nose: No congestion/rhinnorhea. Mouth/Throat: Mucous membranes are moist.    Cardiovascular: Normal rate, regular rhythm. Grossly normal heart sounds.  Good peripheral  circulation. Respiratory: Normal respiratory effort.  No retractions. Lungs CTAB. Gastrointestinal: Soft nontender, no distention, left CVA tenderness, moderate  Musculoskeletal: No lower extremity tenderness nor edema.  Warm and well perfused Neurologic:  Normal speech and language. No gross focal neurologic deficits are appreciated.  Skin:  Skin is warm, dry and intact. No rash noted. Psychiatric: Mood and affect are normal. Speech and behavior are normal.  ____________________________________________   LABS (all labs ordered are listed, but only abnormal results are displayed)  Labs Reviewed  URINALYSIS, COMPLETE (UACMP) WITH MICROSCOPIC - Abnormal; Notable for the following components:      Result Value   Color, Urine YELLOW (*)    APPearance CLEAR (*)  Hgb urine dipstick MODERATE (*)    Ketones, ur 20 (*)    Protein, ur 100 (*)    All other components within normal limits  BASIC METABOLIC PANEL - Abnormal; Notable for the following components:   Glucose, Bld 136 (*)    All other components within normal limits  URINE CULTURE  CBC   ____________________________________________  EKG  ED ECG REPORT I, Lavonia Drafts, the attending physician, personally viewed and interpreted this ECG.  Date: 06/19/2018  Rhythm: normal sinus rhythm QRS Axis: normal Intervals: normal ST/T Wave abnormalities: Nonspecific changes Narrative Interpretation: no evidence of acute ischemia  ____________________________________________  RADIOLOGY  CT renal stone study demonstrates left hydronephrosis but no stone, perinephric stranding ____________________________________________   PROCEDURES  Procedure(s) performed: No  Procedures   Critical Care performed: No ____________________________________________   INITIAL IMPRESSION / ASSESSMENT AND PLAN / ED COURSE  Pertinent labs & imaging results that were available during my care of the patient were reviewed by me and considered  in my medical decision making (see chart for details).  Patient presents with left-sided flank pain, with dysuria suspicious for [place] versus UTI, less likely ureterolithiasis given gradual onset pain.  Work overall reassuring, no white blood cell count, afebrile, normal heart rate.  Significant pain however treated with IV morphine and IV Zofran.  CT renal stone study done inserts hydronephrosis, perinephric stranding.  Urinalysis overall unremarkable, will send culture as I feel this is likely pyelonephritis we will treat with Rocephin and admit to the hospital service    ____________________________________________   FINAL CLINICAL IMPRESSION(S) / ED DIAGNOSES  Final diagnoses:  Pyelonephritis        Note:  This document was prepared using Dragon voice recognition software and may include unintentional dictation errors.    Lavonia Drafts, MD 06/19/18 [id number]

## 2018-06-19 NOTE — ED Notes (Signed)
Warm blanket given

## 2018-06-19 NOTE — Progress Notes (Signed)
Patient ID: Kelly Ritter, female   DOB: 10/14/1933, 84 y.o.   MRN: [id number]  ACP note  Patient and daughter present  Diagnosis: Suspected pyelonephritis with back and abdominal pain, hypertension, hyperlipidemia, history of CAD and peripheral vascular disease.  CODE STATUS discussed and patient wishes to be a DO NOT RESUSCITATE.  Plan.  Continue to monitor.  Empiric antibiotics.  Follow-up urine culture.  IV fluid hydration.  Pain control with oxycodone and morphine.  Time spent on ACP discussion 17 minutes Dr. Loletha Grayer

## 2018-06-19 NOTE — H&P (Signed)
[facility] [place] NAME: Kelly Ritter    MR#:  [id number]  DATE OF BIRTH:  12/14/1933  DATE OF ADMISSION:  06/19/2018  PRIMARY CARE PHYSICIAN: Glean Hess, MD   REQUESTING/REFERRING PHYSICIAN: Dr Lavonia Drafts  CHIEF COMPLAINT:   Chief Complaint  Patient presents with  . Flank Pain  . Dizziness    HISTORY OF PRESENT ILLNESS:  Kelly Ritter  is a 84 y.o. female presents with abdominal pain and back pain.  She states is been going on for 2 days.  Feels like a piercing pain from her back coming through her abdomen.  9 out of 10 in intensity.  Nothing makes it better or worse.  Still about the same even with pain medications.  She felt dizzy nauseous and actually vomited.  She called 911 to bring her husband and to the emergency room but she came also because of the way she is been feeling.  In the ER, she was thought to have pyelonephritis with stranding around a left swollen kidney.  Hospitalist services were contacted for evaluation.  PAST MEDICAL HISTORY:   Past Medical History:  Diagnosis Date  . CAD (coronary artery disease)    s/p PCI and stent placement of circumflex and LAD and RCA.  restenosis of RCA 2014 with drug eluting stent  . Collagen vascular disease ([place])   . Hiatal hernia   . Hypercholesterolemia   . Hypertension   . Peripheral vascular disease ([place])     PAST SURGICAL HISTORY:   Past Surgical History:  Procedure Laterality Date  . ABDOMINAL HYSTERECTOMY  1979  . APPENDECTOMY  1956  . CARDIAC CATHETERIZATION N/A 09/25/2015   Procedure: Left Heart Cath and Coronary Angiography;  Surgeon: Teodoro Spray, MD;  Location: [facility];  Service: Cardiovascular;  Laterality: N/A;  . CAROTID STENT Left 2014  . EYE SURGERY  2010   cataracts  . JOINT REPLACEMENT     knee replacement  . PERIPHERAL VASCULAR CATHETERIZATION Left 12/19/2014   Procedure: Carotid PTA/Stent Intervention;  Surgeon: Algernon Huxley, MD;  Location: [facility];  Service: Cardiovascular;  Laterality: Left;    SOCIAL HISTORY:   Social History   Tobacco Use  . Smoking status: Former Smoker    Packs/day: 1.00    Years: 8.00    Pack years: 8.00    Types: Cigarettes    Last attempt to quit: 08/12/1989    Years since quitting: 28.8  . Smokeless tobacco: Former Systems developer  . Tobacco comment: smoking cessation materials not required  Substance Use Topics  . Alcohol use: Not Currently    Alcohol/week: 0.0 standard drinks    FAMILY HISTORY:   Family History  Problem Relation Age of Onset  . Heart disease Mother   . Hypertension Brother   . Heart disease Brother 33       several MIs  . Heart disease Daughter 91       deceased from MI  . Heart disease Son     DRUG ALLERGIES:   Allergies  Allergen Reactions  . Sulfa Antibiotics Itching, Swelling, Rash and Hives    Swelling    . Latex Rash, Hives and Other (See Comments)    Skin eruptions    . Baclofen Other (See Comments)    Muscle spasms and cramps in shoulder area  . Donepezil Other (See Comments)    REVIEW OF SYSTEMS:  CONSTITUTIONAL: No fever.  Felt hot.  No fatigue or weakness.  EYES: No blurred or double vision.  EARS, NOSE, AND THROAT: No tinnitus or ear pain. No sore throat.  RESPIRATORY: No cough, shortness of breath, wheezing or hemoptysis.  CARDIOVASCULAR: No chest pain, orthopnea, edema.  GASTROINTESTINAL: Positive for nausea, vomiting, and abdominal pain. No blood in bowel movements GENITOURINARY: Some dysuria.  ENDOCRINE: No polyuria, nocturia,  HEMATOLOGY: No anemia, easy bruising or bleeding SKIN: No rash or lesion. MUSCULOSKELETAL: No joint pain or arthritis.   NEUROLOGIC: No tingling, numbness, weakness.  PSYCHIATRY: No anxiety or depression.   MEDICATIONS AT HOME:   Prior to Admission medications   Medication Sig Start Date End Date Taking? Authorizing Provider  acetaminophen (TYLENOL) 650 MG CR tablet Take 650 mg by  mouth at bedtime as needed for pain.     [provider]  clopidogrel (PLAVIX) 75 MG tablet Take 75 mg by mouth daily.    [provider]  Cyanocobalamin (B-12) 500 MCG TABS Take 1 tablet by mouth daily. 08/14/17   Plonk, Gwyndolyn Saxon, MD  lisinopril (PRINIVIL,ZESTRIL) 10 MG tablet Take 10 mg by mouth daily.    [provider]  metaxalone (SKELAXIN) 800 MG tablet Take 1 tablet (800 mg total) by mouth at bedtime. 06/18/18   Glean Hess, MD  nebivolol (BYSTOLIC) 5 MG tablet Take 5 mg by mouth daily.    [provider]  potassium gluconate 595 MG TABS tablet Take 595 mg by mouth at bedtime as needed (leg cramps).    [provider]  rosuvastatin (CRESTOR) 20 MG tablet Take 20 mg by mouth at bedtime.    [provider]  traMADol (ULTRAM) 50 MG tablet Take 1 tablet (50 mg total) by mouth every 6 (six) hours as needed. 06/18/18   Glean Hess, MD  valACYclovir (VALTREX) 1000 MG tablet Take 1 tablet (1,000 mg total) by mouth 3 (three) times daily. 06/10/18   Glean Hess, MD      VITAL SIGNS:  Blood pressure (!) 177/85, pulse 79, temperature (!) 97.2 F (36.2 C), temperature source Oral, resp. rate 14, height 5\' 4"  (1.626 m), weight 70 kg, SpO2 94 %.  PHYSICAL EXAMINATION:  GENERAL:  84 y.o.-year-old patient lying in the bed with no acute distress.  EYES: Pupils equal, round, reactive to light and accommodation. No scleral icterus. Extraocular muscles intact.  HEENT: Head atraumatic, normocephalic. Oropharynx and nasopharynx clear.  NECK:  Supple, no jugular venous distention. No thyroid enlargement, no tenderness.  LUNGS: Normal breath sounds bilaterally, no wheezing, rales,rhonchi or crepitation. No use of accessory muscles of respiration.  CARDIOVASCULAR: S1, S2 normal. No murmurs, rubs, or gallops.  ABDOMEN: Soft, nontender, nondistended. Bowel sounds present. No organomegaly or mass.  No CVA tenderness. EXTREMITIES: No pedal edema,  cyanosis, or clubbing.  NEUROLOGIC: Cranial nerves II through XII are intact. Muscle strength 5/5 in all extremities. Sensation intact. Gait not checked.  PSYCHIATRIC: The patient is alert and oriented x 3.  SKIN: No rash, lesion, or ulcer.   LABORATORY PANEL:   CBC Recent Labs  Lab 06/19/18 1329  WBC 8.2  HGB 12.8  HCT 38.9  PLT 201   ------------------------------------------------------------------------------------------------------------------  Chemistries  Recent Labs  Lab 06/19/18 1329  NA 139  K 3.8  CL 106  CO2 24  GLUCOSE 136*  BUN 15  CREATININE 0.73  CALCIUM 9.0   ------------------------------------------------------------------------------------------------------------------    RADIOLOGY:  Ct Renal Stone Study  Result Date: 06/19/2018 CLINICAL DATA:  Left-sided flank pain with worsening, nausea  and vomiting EXAM: CT ABDOMEN AND PELVIS WITHOUT CONTRAST TECHNIQUE: Multidetector CT imaging of the abdomen and pelvis was performed following the standard protocol without IV contrast. COMPARISON:  None. FINDINGS: Lower chest: Lung bases demonstrate no acute consolidation or effusion. Coronary vascular calcifications. Hepatobiliary: No focal liver abnormality is seen. No gallstones, gallbladder wall thickening, or biliary dilatation. Pancreas: Unremarkable. No pancreatic ductal dilatation or surrounding inflammatory changes. Spleen: Normal in size without focal abnormality. Adrenals/Urinary Tract: Adrenal glands are within normal limits. Moderate left perinephric fat stranding. Mild left hydronephrosis. Left ureter does not appear dilated. No definite ureteral stone. Bladder normal Stomach/Bowel: Stomach is nonenlarged. No dilated small bowel. Sigmoid colon diverticular disease without acute inflammatory process. Vascular/Lymphatic: Moderate-to-marked aortic atherosclerosis. No aneurysmal dilatation. No significant adenopathy Reproductive: Status post hysterectomy. No  adnexal masses. Other: No free air or free fluid Musculoskeletal: No acute or suspicious lesion. Degenerative changes. IMPRESSION: 1. Mild to moderate left hydronephrosis but without hydroureter. No ureteral stones are visualized. Findings could be secondary to recently passed stone. Could consider UPJ configuration with superimposed pyelonephritis. 2. Sigmoid colon diverticular disease without acute inflammatory process. Electronically Signed   By: Donavan Foil M.D.   On: 06/19/2018 14:13    EKG:   Normal sinus rhythm 81 bpm, left atrial enlargement  IMPRESSION AND PLAN:   1.  Suspected pyelonephritis with back and abdominal pain.  Patient had blood in the urine but urine analysis does not really look that positive.  May be she passed a stone.  The patient does not believe she passed a stone.  We will give IV fluid hydration and empiric antibiotics for now and continue to monitor day by day.  Pain control with oxycodone and morphine as needed.  ER physician did speak with a urologist that was in the ER seeing another patient and they recommended just monitoring at this point. 2.  Hypertension elevated with pain.  Give Bystolic now and lisinopril nightly 3.  Hyperlipidemia unspecified on Crestor 4.  History of CAD and peripheral vascular disease on Plavix   All the records are reviewed and case discussed with ED provider. Management plans discussed with the patient, family and they are in agreement.  CODE STATUS: DNR  TOTAL TIME TAKING CARE OF THIS PATIENT: 50 minutes, including acp time.    Loletha Grayer M.D on 06/19/2018 at 4:10 PM  Between 7am to 6pm - Pager - [phone]  After 6pm call admission pager [phone]  [facility] Office  [phone]  CC: Primary care physician; Glean Hess, MD

## 2018-06-19 NOTE — ED Notes (Signed)
Patient transported to CT 

## 2018-06-19 NOTE — ED Triage Notes (Addendum)
Started with acute left flank pain then dizziness and vomiting r/t pain. She reports the flank pain has been going on since yesterday. Painful urination present. Dizziness and one episode of vomiting upon standing. CBG 182 per EMS. 189/80 per EMS.

## 2018-06-20 ENCOUNTER — Inpatient Hospital Stay: Payer: Medicare Other

## 2018-06-20 ENCOUNTER — Encounter: Payer: Self-pay | Admitting: Radiology

## 2018-06-20 LAB — BASIC METABOLIC PANEL
ANION GAP: 7 (ref 5–15)
BUN: 16 mg/dL (ref 8–23)
CO2: 27 mmol/L (ref 22–32)
Calcium: 8.5 mg/dL — ABNORMAL LOW (ref 8.9–10.3)
Chloride: 105 mmol/L (ref 98–111)
Creatinine, Ser: 0.84 mg/dL (ref 0.44–1.00)
Glucose, Bld: 103 mg/dL — ABNORMAL HIGH (ref 70–99)
Potassium: 3.6 mmol/L (ref 3.5–5.1)
Sodium: 139 mmol/L (ref 135–145)

## 2018-06-20 LAB — CBC
HCT: 36.1 % (ref 36.0–46.0)
Hemoglobin: 11.5 g/dL — ABNORMAL LOW (ref 12.0–15.0)
MCH: 30.7 pg (ref 26.0–34.0)
MCHC: 31.9 g/dL (ref 30.0–36.0)
MCV: 96.3 fL (ref 80.0–100.0)
NRBC: 0 % (ref 0.0–0.2)
PLATELETS: 185 10*3/uL (ref 150–400)
RBC: 3.75 MIL/uL — AB (ref 3.87–5.11)
RDW: 12.3 % (ref 11.5–15.5)
WBC: 5.8 10*3/uL (ref 4.0–10.5)

## 2018-06-20 MED ORDER — OXYCODONE HCL 5 MG PO TABS
10.0000 mg | ORAL_TABLET | ORAL | Status: DC | PRN
  Administered 2018-06-20 – 2018-06-22 (×5): 10 mg via ORAL
  Filled 2018-06-20 (×5): qty 2

## 2018-06-20 MED ORDER — KETOROLAC TROMETHAMINE 15 MG/ML IJ SOLN
15.0000 mg | Freq: Four times a day (QID) | INTRAMUSCULAR | Status: DC | PRN
  Administered 2018-06-20: 15 mg via INTRAVENOUS
  Filled 2018-06-20 (×2): qty 1

## 2018-06-20 MED ORDER — IOPAMIDOL (ISOVUE-300) INJECTION 61%
125.0000 mL | Freq: Once | INTRAVENOUS | Status: AC | PRN
  Administered 2018-06-20: 14:00:00 125 mL via INTRAVENOUS

## 2018-06-20 NOTE — Progress Notes (Signed)
Patient ID: Kelly Ritter, female   DOB: 1934-02-04, 84 y.o.   MRN: [id number]  [facility] PROGRESS NOTE  MARSHEILA ALEJO SWN:[id number] DOB: [birth year] DOA: 06/19/2018 PCP: Glean Hess, MD  HPI/Subjective: Patient still having a lot of pain in her left back area.  States that she is urinating okay.  No history of passing stones.  She was going to the chiropractor quite a bit for her low back recently.  Did have shingles on the right side.  Objective: Vitals:   06/20/18 0503 06/20/18 0900  BP: (!) 111/52 (!) 111/50  Pulse: (!) 58 68  Resp: 17 17  Temp: 98.6 F (37 C) 98 F (36.7 C)  SpO2: 95% 95%    Filed Weights   06/19/18 1322  Weight: 70 kg    ROS: Review of Systems  Constitutional: Negative for chills and fever.  Eyes: Negative for blurred vision.  Respiratory: Negative for cough and shortness of breath.   Cardiovascular: Negative for chest pain.  Gastrointestinal: Positive for abdominal pain. Negative for constipation, diarrhea, nausea and vomiting.  Genitourinary: Negative for dysuria.  Musculoskeletal: Positive for back pain. Negative for joint pain.  Neurological: Negative for dizziness and headaches.   Exam: Physical Exam  HENT:  Nose: No mucosal edema.  Mouth/Throat: No oropharyngeal exudate or posterior oropharyngeal edema.  Eyes: Pupils are equal, round, and reactive to light. Conjunctivae, EOM and lids are normal.  Neck: No JVD present. Carotid bruit is not present. No edema present. No thyroid mass and no thyromegaly present.  Cardiovascular: S1 normal and S2 normal. Exam reveals no gallop.  No murmur heard. Pulses:      Dorsalis pedis pulses are 2+ on the right side, and 2+ on the left side.  Respiratory: No respiratory distress. She has no wheezes. She has no rhonchi. She has no rales.  GI: Soft. Bowel sounds are normal. There is no tenderness.  Patient feels like the tenderness is on the inside.  Genitourinary:  Genitourinary Comments: No  CVA tenderness  Musculoskeletal:       Right shoulder: She exhibits no swelling.  Lymphadenopathy:    She has no cervical adenopathy.  Neurological: She is alert. No cranial nerve deficit.  Skin: Skin is warm. No rash noted. Nails show no clubbing.  Psychiatric: She has a normal mood and affect.      Data Reviewed: Basic Metabolic Panel: Recent Labs  Lab 06/19/18 1329 06/20/18 0345  NA 139 139  K 3.8 3.6  CL 106 105  CO2 24 27  GLUCOSE 136* 103*  BUN 15 16  CREATININE 0.73 0.84  CALCIUM 9.0 8.5*   CBC: Recent Labs  Lab 06/19/18 1329 06/20/18 0345  WBC 8.2 5.8  HGB 12.8 11.5*  HCT 38.9 36.1  MCV 96.3 96.3  PLT 201 185     Studies: Ct Renal Stone Study  Result Date: 06/19/2018 CLINICAL DATA:  Left-sided flank pain with worsening, nausea and vomiting EXAM: CT ABDOMEN AND PELVIS WITHOUT CONTRAST TECHNIQUE: Multidetector CT imaging of the abdomen and pelvis was performed following the standard protocol without IV contrast. COMPARISON:  None. FINDINGS: Lower chest: Lung bases demonstrate no acute consolidation or effusion. Coronary vascular calcifications. Hepatobiliary: No focal liver abnormality is seen. No gallstones, gallbladder wall thickening, or biliary dilatation. Pancreas: Unremarkable. No pancreatic ductal dilatation or surrounding inflammatory changes. Spleen: Normal in size without focal abnormality. Adrenals/Urinary Tract: Adrenal glands are within normal limits. Moderate left perinephric fat stranding. Mild left hydronephrosis. Left ureter does not  appear dilated. No definite ureteral stone. Bladder normal Stomach/Bowel: Stomach is nonenlarged. No dilated small bowel. Sigmoid colon diverticular disease without acute inflammatory process. Vascular/Lymphatic: Moderate-to-marked aortic atherosclerosis. No aneurysmal dilatation. No significant adenopathy Reproductive: Status post hysterectomy. No adnexal masses. Other: No free air or free fluid Musculoskeletal: No acute  or suspicious lesion. Degenerative changes. IMPRESSION: 1. Mild to moderate left hydronephrosis but without hydroureter. No ureteral stones are visualized. Findings could be secondary to recently passed stone. Could consider UPJ configuration with superimposed pyelonephritis. 2. Sigmoid colon diverticular disease without acute inflammatory process. Electronically Signed   By: Donavan Foil M.D.   On: 06/19/2018 14:13    Scheduled Meds: . clopidogrel  75 mg Oral Daily  . lisinopril  10 mg Oral QHS  . nebivolol  5 mg Oral Daily  . rosuvastatin  20 mg Oral q1800   Continuous Infusions: . sodium chloride 50 mL/hr at 06/20/18 0600  . cefTRIAXone (ROCEPHIN)  IV      Assessment/Plan:  1. Suspected pyelonephritis with back and abdominal pain.  Patient's urine analysis was not that impressive.  In reading the CT scan the patient may have passed a stone but I would hope if she passed the stone her pain would be much better now.  Continue empiric antibiotics.  Still waiting for urine culture.  Paged urology just to run the CT scan by them and get their opinion.  We will give a trial of Toradol 2. Hypertension on Bystolic 3. Hyperlipidemia on Crestor 4. History of CAD and peripheral vascular disease on Plavix  Code Status:     Code Status Orders  (From admission, onward)         Start     Ordered   06/19/18 1608  Do not attempt resuscitation (DNR)  Continuous    Question Answer Comment  In the event of cardiac or respiratory ARREST Do not call a "code blue"   In the event of cardiac or respiratory ARREST Do not perform Intubation, CPR, defibrillation or ACLS   In the event of cardiac or respiratory ARREST Use medication by any route, position, wound care, and other measures to relive pain and suffering. May use oxygen, suction and manual treatment of airway obstruction as needed for comfort.   Comments nurse may pronounce      06/19/18 1608        Code Status History    Date Active Date  Inactive Code Status Order ID Comments User Context   09/24/2015 1828 09/25/2015 1826 Full Code [id number]  Nicholes Mango, MD Inpatient   12/19/2014 1523 12/20/2014 1507 Full Code [id number]  Algernon Huxley, MD Inpatient   12/12/2014 1254 12/13/2014 2004 Full Code [id number]  Bettey Costa, MD Inpatient    Advance Directive Documentation     Most Recent Value  Type of Advance Directive  Healthcare Power of Attorney  Pre-existing out of facility DNR order (yellow form or pink MOST form)  -  "MOST" Form in Place?  -     Family Communication: Spoke with family at the bedside Disposition Plan: To be determined  Antibiotics:  Rocephin  Time spent: 28 minutes  [place]

## 2018-06-21 ENCOUNTER — Encounter
Admission: EM | Disposition: A | Discharge status: Home / Self Care | Payer: Self-pay | Source: Home / Self Care | Attending: Internal Medicine

## 2018-06-21 ENCOUNTER — Inpatient Hospital Stay: Payer: Medicare Other | Admitting: Anesthesiology

## 2018-06-21 ENCOUNTER — Encounter: Payer: Self-pay | Admitting: Anesthesiology

## 2018-06-21 DIAGNOSIS — N12 Tubulo-interstitial nephritis, not specified as acute or chronic: Secondary | ICD-10-CM

## 2018-06-21 DIAGNOSIS — N1330 Unspecified hydronephrosis: Secondary | ICD-10-CM

## 2018-06-21 HISTORY — PX: CYSTOSCOPY WITH STENT PLACEMENT: SHX5790

## 2018-06-21 LAB — URINE CULTURE: Culture: 10000 — AB

## 2018-06-21 SURGERY — CYSTOSCOPY, WITH STENT INSERTION
Anesthesia: General | Laterality: Left

## 2018-06-21 MED ORDER — OXYCODONE HCL 5 MG/5ML PO SOLN: 5.0000 mg | Freq: Once | ORAL | Status: DC | PRN

## 2018-06-21 MED ORDER — LIDOCAINE HCL (PF) 2 % IJ SOLN
INTRAMUSCULAR | Status: AC
  Filled 2018-06-21: qty 10

## 2018-06-21 MED ORDER — FENTANYL CITRATE (PF) 100 MCG/2ML IJ SOLN
INTRAMUSCULAR | Status: AC
  Filled 2018-06-21: qty 2

## 2018-06-21 MED ORDER — PHENYLEPHRINE HCL 10 MG/ML IJ SOLN
INTRAMUSCULAR | Status: DC | PRN
  Administered 2018-06-21 (×2): 100 ug via INTRAVENOUS

## 2018-06-21 MED ORDER — FENTANYL CITRATE (PF) 100 MCG/2ML IJ SOLN: 25.0000 ug | INTRAMUSCULAR | Status: DC | PRN

## 2018-06-21 MED ORDER — LACTATED RINGERS IV SOLN
INTRAVENOUS | Status: DC | PRN
  Administered 2018-06-21: 17:00:00 via INTRAVENOUS

## 2018-06-21 MED ORDER — FENTANYL CITRATE (PF) 100 MCG/2ML IJ SOLN
INTRAMUSCULAR | Status: DC | PRN
  Administered 2018-06-21: 25 ug via INTRAVENOUS

## 2018-06-21 MED ORDER — PROPOFOL 10 MG/ML IV BOLUS
INTRAVENOUS | Status: AC
  Filled 2018-06-21: qty 20

## 2018-06-21 MED ORDER — ONDANSETRON HCL 4 MG/2ML IJ SOLN
INTRAMUSCULAR | Status: DC | PRN
  Administered 2018-06-21: 4 mg via INTRAVENOUS

## 2018-06-21 MED ORDER — PROPOFOL 10 MG/ML IV BOLUS
INTRAVENOUS | Status: DC | PRN
  Administered 2018-06-21: 130 mg via INTRAVENOUS

## 2018-06-21 MED ORDER — DEXAMETHASONE SODIUM PHOSPHATE 10 MG/ML IJ SOLN
INTRAMUSCULAR | Status: DC | PRN
  Administered 2018-06-21: 10 mg via INTRAVENOUS

## 2018-06-21 MED ORDER — LIDOCAINE HCL (CARDIAC) PF 100 MG/5ML IV SOSY
PREFILLED_SYRINGE | INTRAVENOUS | Status: DC | PRN
  Administered 2018-06-21: 100 mg via INTRAVENOUS

## 2018-06-21 MED ORDER — KETOROLAC TROMETHAMINE 15 MG/ML IJ SOLN
15.0000 mg | Freq: Once | INTRAMUSCULAR | Status: AC
  Administered 2018-06-21: 22:00:00 15 mg via INTRAVENOUS
  Filled 2018-06-21: qty 1

## 2018-06-21 MED ORDER — OXYCODONE HCL 5 MG PO TABS: 5.0000 mg | ORAL_TABLET | Freq: Once | ORAL | Status: DC | PRN

## 2018-06-21 MED ORDER — FESOTERODINE FUMARATE ER 4 MG PO TB24
4.0000 mg | ORAL_TABLET | Freq: Every day | ORAL | Status: DC
  Administered 2018-06-21 – 2018-06-22 (×2): 4 mg via ORAL
  Filled 2018-06-21 (×2): qty 1

## 2018-06-21 SURGICAL SUPPLY — 21 items
BAG DRAIN CYSTO-URO LG1000N (MISCELLANEOUS) ×2 IMPLANT
BRUSH SCRUB EZ  4% CHG (MISCELLANEOUS)
BRUSH SCRUB EZ 4% CHG (MISCELLANEOUS) IMPLANT
CATH URETL 5X70 OPEN END (CATHETERS) ×2 IMPLANT
CNTNR SPEC 2.5X3XGRAD LEK (MISCELLANEOUS) ×1
CONT SPEC 4OZ STER OR WHT (MISCELLANEOUS) ×1
CONTAINER SPEC 2.5X3XGRAD LEK (MISCELLANEOUS) ×1 IMPLANT
GLOVE BIO SURGEON STRL SZ8 (GLOVE) ×6 IMPLANT
GOWN STANDARD XL  REUSABL (MISCELLANEOUS) ×2 IMPLANT
KIT TURNOVER CYSTO (KITS) ×2 IMPLANT
PACK CYSTO AR (MISCELLANEOUS) ×2 IMPLANT
SENSORWIRE 0.038 NOT ANGLED (WIRE) ×2
SET CYSTO W/LG BORE CLAMP LF (SET/KITS/TRAYS/PACK) ×2 IMPLANT
SOL .9 NS 3000ML IRR  AL (IV SOLUTION) ×1
SOL .9 NS 3000ML IRR UROMATIC (IV SOLUTION) ×1 IMPLANT
STENT PERCUFLEX 4.8FRX24 (STENTS) ×2 IMPLANT
STENT URET 6FRX24 CONTOUR (STENTS) IMPLANT
STENT URET 6FRX26 CONTOUR (STENTS) IMPLANT
SURGILUBE 2OZ TUBE FLIPTOP (MISCELLANEOUS) ×2 IMPLANT
WATER STERILE IRR 1000ML POUR (IV SOLUTION) ×2 IMPLANT
WIRE SENSOR 0.038 NOT ANGLED (WIRE) ×1 IMPLANT

## 2018-06-21 NOTE — Anesthesia Postprocedure Evaluation (Signed)
Anesthesia Post Note  Patient: Kelly Ritter  Procedure(s) Performed: CYSTOSCOPY WITH STENT PLACEMENT (Left )  Patient location during evaluation: PACU Anesthesia Type: General Level of consciousness: awake and alert Pain management: pain level controlled Vital Signs Assessment: post-procedure vital signs reviewed and stable Respiratory status: spontaneous breathing, nonlabored ventilation, respiratory function stable and patient connected to nasal cannula oxygen Cardiovascular status: blood pressure returned to baseline and stable Postop Assessment: no apparent nausea or vomiting Anesthetic complications: no     Last Vitals:  Vitals:   06/21/18 1824 06/21/18 1827  BP: (!) 165/86   Pulse: 74 78  Resp: 18 (!) 21  Temp:  37.1 C  SpO2: 96% 97%    Last Pain:  Vitals:   06/21/18 1901  TempSrc:   PainSc: 4                  Precious Haws Armonii Sieh

## 2018-06-21 NOTE — Anesthesia Procedure Notes (Signed)
Procedure Name: LMA Insertion Date/Time: 06/21/2018 5:24 PM Performed by: Sherrine Maples, CRNA Pre-anesthesia Checklist: Patient identified, Patient being monitored, Timeout performed, Emergency Drugs available and Suction available Patient Re-evaluated:Patient Re-evaluated prior to induction Oxygen Delivery Method: Circle system utilized Preoxygenation: Pre-oxygenation with 100% oxygen Induction Type: IV induction LMA: LMA inserted LMA Size: 4.0 Number of attempts: 1 Placement Confirmation: positive ETCO2 and breath sounds checked- equal and bilateral Dental Injury: Teeth and Oropharynx as per pre-operative assessment

## 2018-06-21 NOTE — Op Note (Signed)
Preoperative diagnosis:  1. Left hydronephrosis 2. Left flank pain 3. Microhematuria  Postoperative diagnosis:  1. Same  Procedure:  1. Cystoscopy 2. Left ureteral stent placement (5 [place]) 24 cm 3. Left retrograde pyelography with interpretation   Surgeon: Nicki Reaper C. Katheline Brendlinger, M.D.  Anesthesia: General  Complications: None  Intraoperative findings:  1.  Retrograde pyelogram-normal caliber ureter without filling defect.  Mild to moderate hydronephrosis without filling defect  EBL: Minimal  Specimens: None  Indication: Kelly Ritter is a 84 y.o. patient who presented to the ED on 06/19/2018 with moderate to severe left flank pain and noncontrast CT showing mild left hydronephrosis and perinephric stranding.  Urinalysis showed microhematuria.  Voided urine grew 10,000 colonies of E. coli.  She was admitted and started on IV antibiotics.  Her pain had not significantly improved after 24 hours and was severe at times.  CT urogram was performed which did not show high-grade obstruction.  After reviewing the management options for treatment, he elected to proceed with the above surgical procedure(s). We have discussed the potential benefits and risks of the procedure, side effects of the proposed treatment, the likelihood of the patient achieving the goals of the procedure, and any potential problems that might occur during the procedure or recuperation. Informed consent has been obtained.  Description of procedure:  The patient was taken to the operating room and general anesthesia was induced.  The patient was placed in the dorsal lithotomy position, prepped and draped in the usual sterile fashion, and preoperative antibiotics were administered. A preoperative time-out was performed.   Cystourethroscopy was performed.  The patient's urethra was examined and was normal/in appearance. The bladder was then systematically examined in its entirety. There was no evidence for any bladder tumors  or stones.  There were scattered hemorrhagic areas throughout the bladder endoscopically consistent with cystitis.  The ureteral orifices were normal in appearance.  No efflux was seen from the left ureteral orifice.  Attention then turned to the left ureteral orifice and a ureteral catheter was used to intubate the ureteral orifice.  Omnipaque contrast was injected through the ureteral catheter and a retrograde pyelogram was performed with findings as dictated above.  A 0.38 sensor guidewire was then advanced through the ureteral catheter to the renal pelvis under fluoroscopic guidance.  The catheter was then advanced over the wire and bloody urine was aspirated from the renal pelvis and sent for culture.  The guidewire was replaced and the ureteral catheter was removed.  The ureter and hydronephrotic collecting system was relatively small and a 5 French/24 cm double-J ureteral stent was placed over the wire.  Adequate positioning was noted proximally and distally.  The bladder was then emptied and the procedure ended.  The patient appeared to tolerate the procedure well and without complications.  After anesthetic reversal she was transferred to the PACU in satisfactory condition.

## 2018-06-21 NOTE — Anesthesia Post-op Follow-up Note (Signed)
Anesthesia QCDR form completed.        

## 2018-06-21 NOTE — Progress Notes (Signed)
Patient ID: ALEASE FAIT, female   DOB: 1934-07-15, 84 y.o.   MRN: [id number]  [facility] PROGRESS NOTE  SACORA HAWBAKER ZLD:[id number] DOB: 27-Sep-1933 DOA: 06/19/2018 PCP: Glean Hess, MD  HPI/Subjective: Patient was feeling better this morning after I saw her.  She did have some Toradol last night.  She stated the Toradol worked better than the oxycodone.  Patient states that she is urinating.  Objective: Vitals:   06/20/18 2038 06/21/18 0548  BP: 130/71 (!) 133/46  Pulse: 74 61  Resp:  18  Temp:  97.8 F (36.6 C)  SpO2:  95%    Filed Weights   06/19/18 1322  Weight: 70 kg    ROS: Review of Systems  Constitutional: Negative for chills and fever.  Eyes: Negative for blurred vision.  Respiratory: Negative for cough and shortness of breath.   Cardiovascular: Negative for chest pain.  Gastrointestinal: Positive for abdominal pain. Negative for constipation, diarrhea, nausea and vomiting.  Genitourinary: Negative for dysuria.  Musculoskeletal: Positive for back pain. Negative for joint pain.  Neurological: Negative for dizziness and headaches.   Exam: Physical Exam  HENT:  Nose: No mucosal edema.  Mouth/Throat: No oropharyngeal exudate or posterior oropharyngeal edema.  Eyes: Pupils are equal, round, and reactive to light. Conjunctivae, EOM and lids are normal.  Neck: No JVD present. Carotid bruit is not present. No edema present. No thyroid mass and no thyromegaly present.  Cardiovascular: S1 normal and S2 normal. Exam reveals no gallop.  No murmur heard. Pulses:      Dorsalis pedis pulses are 2+ on the right side, and 2+ on the left side.  Respiratory: No respiratory distress. She has no wheezes. She has no rhonchi. She has no rales.  GI: Soft. Bowel sounds are normal. There is no tenderness.  Patient feels like the tenderness is on the inside.  Genitourinary:  Genitourinary Comments: No CVA tenderness  Musculoskeletal:       Right shoulder: She exhibits  no swelling.  Lymphadenopathy:    She has no cervical adenopathy.  Neurological: She is alert. No cranial nerve deficit.  Skin: Skin is warm. No rash noted. Nails show no clubbing.  Psychiatric: She has a normal mood and affect.      Data Reviewed: Basic Metabolic Panel: Recent Labs  Lab 06/19/18 1329 06/20/18 0345  NA 139 139  K 3.8 3.6  CL 106 105  CO2 24 27  GLUCOSE 136* 103*  BUN 15 16  CREATININE 0.73 0.84  CALCIUM 9.0 8.5*   CBC: Recent Labs  Lab 06/19/18 1329 06/20/18 0345  WBC 8.2 5.8  HGB 12.8 11.5*  HCT 38.9 36.1  MCV 96.3 96.3  PLT 201 185     Studies: Ct Abdomen Pelvis W Wo Contrast  Result Date: 06/20/2018 CLINICAL DATA:  84 year old female with history of hydronephrosis. Possibly passed a stone. EXAM: CT ABDOMEN AND PELVIS WITHOUT AND WITH CONTRAST TECHNIQUE: Multidetector CT imaging of the abdomen and pelvis was performed following the standard protocol before and following the bolus administration of intravenous contrast. CONTRAST:  155mL ISOVUE-300 IOPAMIDOL (ISOVUE-300) INJECTION 61% COMPARISON:  CT the abdomen and pelvis 06/19/2018. FINDINGS: Lower chest: Mild scarring in the lung bases bilaterally. Atherosclerosis in the descending thoracic aorta and right coronary artery. Hepatobiliary: No suspicious cystic or solid hepatic lesions. No intra or extrahepatic biliary ductal dilatation. Gallbladder is normal in appearance. Pancreas: No pancreatic mass. No pancreatic ductal dilatation. No pancreatic or peripancreatic fluid or inflammatory changes. Spleen: Unremarkable. Adrenals/Urinary  Tract: No calcifications are noted within the collecting system of either kidney, along the course of either ureter, or within the lumen of the urinary bladder. There is mild-to-moderate left hydronephrosis which terminates at the left ureteropelvic junction. Small amount of left perinephric soft tissue stranding. There are no suspicious renal lesions. No filling defects within  the collecting system of either kidney, along the course of either ureter or within the lumen of the urinary bladder on delayed post-contrast images to strongly suggest the presence of urothelial neoplasm at this time. Urinary bladder wall appears mildly thickened both anteriorly and posteriorly, without discrete bladder wall mass. Bilateral adrenal glands are normal in appearance. Stomach/Bowel: Normal appearance of the stomach. No pathologic dilatation of small bowel or colon. Numerous colonic diverticulae are noted, without surrounding inflammatory changes to suggest an acute diverticulitis at this time. The appendix is not confidently identified and may be surgically absent. Regardless, there are no inflammatory changes noted adjacent to the cecum to suggest the presence of an acute appendicitis at this time. Vascular/Lymphatic: Aortic atherosclerosis. 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 No lymphadenopathy noted in the abdomen or pelvis. Reproductive: Status post hysterectomy. Ovaries are not confidently identified may be surgically absent or atrophic. Other: No significant volume of ascites.  No pneumoperitoneum. Musculoskeletal: There are no aggressive appearing lytic or blastic lesions noted in the visualized portions of the skeleton. IMPRESSION: 1. No ureteral stones. There is persistent mild to moderate left hydronephrosis which terminates at the left ureteropelvic junction, suggesting left UPJ obstruction (particularly in light of the small amount of left perinephric stranding). No obstructing lesion is identified. The possibility of a left UPJ stricture should be considered. Urologic consultation is recommended. 2. Colonic diverticulosis without evidence of acute diverticulitis at this time. 3. 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 4. Additional incidental findings, as above. Electronically Signed   By: Vinnie Langton M.D.   On: 06/20/2018 16:44   Ct Renal Stone Study  Result Date:  06/19/2018 CLINICAL DATA:  Left-sided flank pain with worsening, nausea and vomiting EXAM: CT ABDOMEN AND PELVIS WITHOUT CONTRAST TECHNIQUE: Multidetector CT imaging of the abdomen and pelvis was performed following the standard protocol without IV contrast. COMPARISON:  None. FINDINGS: Lower chest: Lung bases demonstrate no acute consolidation or effusion. Coronary vascular calcifications. Hepatobiliary: No focal liver abnormality is seen. No gallstones, gallbladder wall thickening, or biliary dilatation. Pancreas: Unremarkable. No pancreatic ductal dilatation or surrounding inflammatory changes. Spleen: Normal in size without focal abnormality. Adrenals/Urinary Tract: Adrenal glands are within normal limits. Moderate left perinephric fat stranding. Mild left hydronephrosis. Left ureter does not appear dilated. No definite ureteral stone. Bladder normal Stomach/Bowel: Stomach is nonenlarged. No dilated small bowel. Sigmoid colon diverticular disease without acute inflammatory process. Vascular/Lymphatic: Moderate-to-marked aortic atherosclerosis. No aneurysmal dilatation. No significant adenopathy Reproductive: Status post hysterectomy. No adnexal masses. Other: No free air or free fluid Musculoskeletal: No acute or suspicious lesion. Degenerative changes. IMPRESSION: 1. Mild to moderate left hydronephrosis but without hydroureter. No ureteral stones are visualized. Findings could be secondary to recently passed stone. Could consider UPJ configuration with superimposed pyelonephritis. 2. Sigmoid colon diverticular disease without acute inflammatory process. Electronically Signed   By: Donavan Foil M.D.   On: 06/19/2018 14:13    Scheduled Meds: . lisinopril  10 mg Oral QHS  . nebivolol  5 mg Oral Daily  . rosuvastatin  20 mg Oral q1800   Continuous Infusions: . sodium chloride 50 mL/hr at 06/21/18 0753  .  cefTRIAXone (ROCEPHIN)  IV 1 g (06/20/18 1722)    Assessment/Plan:  1. Left ureteral obstruction  with suspected pyelonephritis with back and abdominal pain.  Dr. Bernardo Heater to bring to the operating room for stenting procedure today.  He will obtain another urine culture from the urine past the obstruction.  Her urine culture so far is not that much colonies.  I will continue Rocephin at this time.  Held Toradol prior to urological procedure. 2. Hypertension on Bystolic 3. Hyperlipidemia on Crestor 4. History of CAD and peripheral vascular disease on Plavix 5. 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  This will need to be followed over time.  Code Status:     Code Status Orders  (From admission, onward)         Start     Ordered   06/19/18 1608  Do not attempt resuscitation (DNR)  Continuous    Question Answer Comment  In the event of cardiac or respiratory ARREST Do not call a "code blue"   In the event of cardiac or respiratory ARREST Do not perform Intubation, CPR, defibrillation or ACLS   In the event of cardiac or respiratory ARREST Use medication by any route, position, wound care, and other measures to relive pain and suffering. May use oxygen, suction and manual treatment of airway obstruction as needed for comfort.   Comments nurse may pronounce      06/19/18 1608        Code Status History    Date Active Date Inactive Code Status Order ID Comments User Context   09/24/2015 1828 09/25/2015 1826 Full Code [id number]  Nicholes Mango, MD Inpatient   12/19/2014 1523 12/20/2014 1507 Full Code [id number]  Algernon Huxley, MD Inpatient   12/12/2014 1254 12/13/2014 2004 Full Code [id number]  Bettey Costa, MD Inpatient    Advance Directive Documentation     Most Recent Value  Type of Advance Directive  Healthcare Power of Attorney  Pre-existing out of facility DNR order (yellow form or pink MOST form)  -  "MOST" Form in Place?  -     Family Communication: Spoke with family yesterday. Disposition Plan: Evaluate daily  Antibiotics:  Rocephin  Time spent: 27 minutes  Pink

## 2018-06-21 NOTE — Progress Notes (Signed)
Pt had 1 episode of severe pain 9/10 last night that was relieved with Toradol. Pt had 1 episode of light pink colored urine also but resolved by the next urination. This Pink tinge urine occurred at the time of the severe pain. VS stable. Will continue to monitor and measure urine output.

## 2018-06-21 NOTE — Anesthesia Preprocedure Evaluation (Signed)
Anesthesia Evaluation  Patient identified by MRN, date of birth, ID band Patient awake    Reviewed: Allergy & Precautions, H&P , NPO status , Patient's Chart, lab work & pertinent test results  History of Anesthesia Complications Negative for: history of anesthetic complications  Airway Mallampati: III  TM Distance: <3 FB Neck ROM: full    Dental  (+) Chipped, Poor Dentition   Pulmonary neg shortness of breath, former smoker,           Cardiovascular Exercise Tolerance: Good hypertension, + CAD, + Past MI, + Cardiac Stents and + Peripheral Vascular Disease       Neuro/Psych  Headaches, PSYCHIATRIC DISORDERS  Neuromuscular disease    GI/Hepatic Neg liver ROS, hiatal hernia, GERD  Medicated and Controlled,  Endo/Other  negative endocrine ROS  Renal/GU      Musculoskeletal  (+) Arthritis ,   Abdominal   Peds  Hematology negative hematology ROS (+)   Anesthesia Other Findings Past Medical History: No date: CAD (coronary artery disease)     Comment:  s/p PCI and stent placement of circumflex and LAD and               RCA.  restenosis of RCA 2014 with drug eluting stent No date: Collagen vascular disease ([place]) No date: Hiatal hernia No date: Hypercholesterolemia No date: Hypertension No date: Peripheral vascular disease [facility])  Past Surgical History: 1979: ABDOMINAL HYSTERECTOMY 1956: APPENDECTOMY 09/25/2015: CARDIAC CATHETERIZATION; N/A     Comment:  Procedure: Left Heart Cath and Coronary Angiography;                Surgeon: Teodoro Spray, MD;  Location: [place] [facility]               LAB;  Service: Cardiovascular;  Laterality: N/A; 2014: CAROTID STENT; Left 2010: EYE SURGERY     Comment:  cataracts No date: JOINT REPLACEMENT     Comment:  knee replacement 12/19/2014: PERIPHERAL VASCULAR CATHETERIZATION; Left     Comment:  Procedure: Carotid PTA/Stent Intervention;  Surgeon:               Algernon Huxley, MD;   Location: [place] [facility];                Service: Cardiovascular;  Laterality: Left;  BMI    Body Mass Index:  26.49 kg/m      Reproductive/Obstetrics negative OB ROS                             Anesthesia Physical Anesthesia Plan  ASA: IV  Anesthesia Plan: General LMA   Post-op Pain Management:    Induction: Intravenous  PONV Risk Score and Plan: Ondansetron, Dexamethasone, Midazolam and Treatment may vary due to age or medical condition  Airway Management Planned: LMA  Additional Equipment:   Intra-op Plan:   Post-operative Plan: Extubation in OR  Informed Consent: I have reviewed the patients History and Physical, chart, labs and discussed the procedure including the risks, benefits and alternatives for the proposed anesthesia with the patient or authorized representative who has indicated his/her understanding and acceptance.   Dental Advisory Given  Plan Discussed with: Anesthesiologist, CRNA and Surgeon  Anesthesia Plan Comments: (Plan to suspend DNR for procedure.  Patient and family voiced understanding.  Patient consented for risks of anesthesia including but not limited to:  - adverse reactions to medications - damage to teeth, lips or other oral  mucosa - sore throat or hoarseness - Damage to heart, brain, lungs or loss of life  Patient voiced understanding.)        Anesthesia Quick Evaluation

## 2018-06-21 NOTE — Transfer of Care (Signed)
Immediate Anesthesia Transfer of Care Note  Patient: Kelly Ritter  Procedure(s) Performed: CYSTOSCOPY WITH STENT PLACEMENT (Left )  Patient Location: PACU  Anesthesia Type:General  Level of Consciousness: awake, alert  and oriented  Airway & Oxygen Therapy: Patient Spontanous Breathing and Patient connected to nasal cannula oxygen  Post-op Assessment: Report given to RN and Post -op Vital signs reviewed and stable  Post vital signs: Reviewed and stable  Last Vitals:  Vitals Value Taken Time  BP 130/65 06/21/2018  5:54 PM  Temp    Pulse 64 06/21/2018  5:54 PM  Resp 19 06/21/2018  5:54 PM  SpO2 98 % 06/21/2018  5:54 PM  Vitals shown include unvalidated device data.  Last Pain:  Vitals:   06/21/18 1457  TempSrc: Oral  PainSc:       Patients Stated Pain Goal: 0 (74/08/14 4818)  Complications: No apparent anesthesia complications

## 2018-06-21 NOTE — Consult Note (Signed)
Urology Consult  I have been asked to see the patient by Dr. Leslye Peer, for evaluation and management of left hydronephrosis.  Chief Complaint: Flank pain  History of Present Illness: Kelly Ritter is a 84 y.o. year old who presented to the [facility] ED on 06/19/2018 with a 2-day history of gradually worsening left flank pain.  On presentation she complained of moderate to severe left flank pain without identifiable precipitating, aggravating or alleviating factors.  She had mild dysuria.  Denied previous history of urologic problems or recurrent UTI.  Her urinalysis showed microhematuria.  A noncontrast CT showed mild to moderate left hydronephrosis without ureteral or renal calculi.  Perinephric stranding was noted.  She did have nausea without vomiting.  She was admitted for pyelonephritis.  On 11/9 she had persistent flank pain which was intermittent.  At times it was severe.  Yesterday she had pink-tinged urine associated with her pain.  She received Toradol with resolution of her pain.  A CT urogram was performed yesterday which showed findings consistent with UPJ obstruction.  High-grade obstruction was not present as the renal pelvis was filled with contrast on delayed films however no contrast is seen in the ureter.  She had a CT of the abdomen pelvis performed at [facility] in 2017 and no hydronephrosis was noted.  Urine culture grew 10,000 colonies of E. coli.  Past Medical History:  Diagnosis Date  . CAD (coronary artery disease)    s/p PCI and stent placement of circumflex and LAD and RCA.  restenosis of RCA 2014 with drug eluting stent  . Collagen vascular disease ([place])   . Hiatal hernia   . Hypercholesterolemia   . Hypertension   . Peripheral vascular disease [facility])     Past Surgical History:  Procedure Laterality Date  . ABDOMINAL HYSTERECTOMY  1979  . APPENDECTOMY  1956  . CARDIAC CATHETERIZATION N/A 09/25/2015   Procedure: Left Heart Cath and Coronary Angiography;  Surgeon:  Teodoro Spray, MD;  Location: [place] [facility];  Service: Cardiovascular;  Laterality: N/A;  . CAROTID STENT Left 2014  . EYE SURGERY  2010   cataracts  . JOINT REPLACEMENT     knee replacement  . PERIPHERAL VASCULAR CATHETERIZATION Left 12/19/2014   Procedure: Carotid PTA/Stent Intervention;  Surgeon: Algernon Huxley, MD;  Location: [place] [facility];  Service: Cardiovascular;  Laterality: Left;    Home Medications:  Current Meds  Medication Sig  . acetaminophen (TYLENOL) 650 MG CR tablet Take 650 mg by mouth at bedtime as needed for pain.   Marland Kitchen clopidogrel (PLAVIX) 75 MG tablet Take 75 mg by mouth at bedtime.   . Cyanocobalamin (B-12) 500 MCG TABS Take 1 tablet by mouth daily. (Patient taking differently: Take 500 mcg by mouth daily. )  . lisinopril (PRINIVIL,ZESTRIL) 10 MG tablet Take 10 mg by mouth at bedtime.   . metaxalone (SKELAXIN) 800 MG tablet Take 1 tablet (800 mg total) by mouth at bedtime.  . nebivolol (BYSTOLIC) 5 MG tablet Take 5 mg by mouth daily.  . potassium gluconate 595 MG TABS tablet Take 595 mg by mouth at bedtime as needed (leg cramps).  . rosuvastatin (CRESTOR) 20 MG tablet Take 20 mg by mouth at bedtime.  . traMADol (ULTRAM) 50 MG tablet Take 1 tablet (50 mg total) by mouth every 6 (six) hours as needed. (Patient taking differently: Take 50 mg by mouth every 6 (six) hours as needed for moderate pain. )    Allergies:  Allergies  Allergen Reactions  . Sulfa Antibiotics Itching, Swelling, Rash and Hives    Swelling    . Latex Rash, Hives and Other (See Comments)    Skin eruptions    . Baclofen Other (See Comments)    Muscle spasms and cramps in shoulder area  . Donepezil Other (See Comments)    Family History  Problem Relation Age of Onset  . Heart disease Mother   . Hypertension Brother   . Heart disease Brother 81       several MIs  . Heart disease Daughter 41       deceased from MI  . Heart disease Son     Social History:  reports that she  quit smoking about 28 years ago. Her smoking use included cigarettes. She has a 8.00 pack-year smoking history. She has quit using smokeless tobacco. She reports that she drank alcohol. She reports that she does not use drugs.  ROS: A complete review of systems was performed.  All systems are negative except for pertinent findings as noted.  Physical Exam:  Vital signs in last 24 hours: Temp:  [97.8 F (36.6 C)-98.8 F (37.1 C)] 97.8 F (36.6 C) (11/10 0548) Pulse Rate:  [61-74] 61 (11/10 0548) Resp:  [18-20] 18 (11/10 0548) BP: (111-133)/(46-71) 133/46 (11/10 0548) SpO2:  [94 %-96 %] 95 % (11/10 0548) Constitutional:  Alert and oriented, No acute distress HEENT: [place] AT, moist mucus membranes.  Trachea midline, no masses Cardiovascular: Regular rate and rhythm, no clubbing, cyanosis, or edema. Respiratory: Normal respiratory effort, lungs clear bilaterally GI: Abdomen is soft, nontender, nondistended, no abdominal masses GU: No CVA tenderness Skin: No rashes, bruises or suspicious lesions Lymph: No cervical or inguinal adenopathy Neurologic: Grossly intact, no focal deficits, moving all 4 extremities Psychiatric: Normal mood and affect   Laboratory Data:  Recent Labs    06/19/18 1329 06/20/18 0345  WBC 8.2 5.8  HGB 12.8 11.5*  HCT 38.9 36.1   Recent Labs    06/19/18 1329 06/20/18 0345  NA 139 139  K 3.8 3.6  CL 106 105  CO2 24 27  GLUCOSE 136* 103*  BUN 15 16  CREATININE 0.73 0.84  CALCIUM 9.0 8.5*    Results for orders placed or performed during the hospital encounter of 06/19/18  Urine Culture     Status: Abnormal   Collection Time: 06/19/18  1:13 PM  Result Value Ref Range Status   Specimen Description   Final    URINE, RANDOM Performed at [facility], [address] [place], [place] [address]    Special Requests   Final    NONE Performed at [facility], [address] [place], [place] [address]    Culture 10,000 COLONIES/mL  ESCHERICHIA COLI (A)  Final   Report Status 06/21/2018 FINAL  Final   Organism ID, Bacteria ESCHERICHIA COLI (A)  Final      Susceptibility   Escherichia coli - MIC*    AMPICILLIN 4 SENSITIVE Sensitive     CEFAZOLIN <=4 SENSITIVE Sensitive     CEFTRIAXONE <=1 SENSITIVE Sensitive     CIPROFLOXACIN <=0.25 SENSITIVE Sensitive     GENTAMICIN <=1 SENSITIVE Sensitive     IMIPENEM <=0.25 SENSITIVE Sensitive     NITROFURANTOIN <=16 SENSITIVE Sensitive     TRIMETH/SULFA <=20 SENSITIVE Sensitive     AMPICILLIN/SULBACTAM <=2 SENSITIVE Sensitive     PIP/TAZO <=4 SENSITIVE Sensitive     Extended ESBL NEGATIVE Sensitive     *  10,000 COLONIES/mL ESCHERICHIA COLI     Radiologic Imaging: Ct Abdomen Pelvis W Wo Contrast  Result Date: 06/20/2018 CLINICAL DATA:  84 year old female with history of hydronephrosis. Possibly passed a stone. EXAM: CT ABDOMEN AND PELVIS WITHOUT AND WITH CONTRAST TECHNIQUE: Multidetector CT imaging of the abdomen and pelvis was performed following the standard protocol before and following the bolus administration of intravenous contrast. CONTRAST:  111mL ISOVUE-300 IOPAMIDOL (ISOVUE-300) INJECTION 61% COMPARISON:  CT the abdomen and pelvis 06/19/2018. FINDINGS: Lower chest: Mild scarring in the lung bases bilaterally. Atherosclerosis in the descending thoracic aorta and right coronary artery. Hepatobiliary: No suspicious cystic or solid hepatic lesions. No intra or extrahepatic biliary ductal dilatation. Gallbladder is normal in appearance. Pancreas: No pancreatic mass. No pancreatic ductal dilatation. No pancreatic or peripancreatic fluid or inflammatory changes. Spleen: Unremarkable. Adrenals/Urinary Tract: No calcifications are noted within the collecting system of either kidney, along the course of either ureter, or within the lumen of the urinary bladder. There is mild-to-moderate left hydronephrosis which terminates at the left ureteropelvic junction. Small amount of left  perinephric soft tissue stranding. There are no suspicious renal lesions. No filling defects within the collecting system of either kidney, along the course of either ureter or within the lumen of the urinary bladder on delayed post-contrast images to strongly suggest the presence of urothelial neoplasm at this time. Urinary bladder wall appears mildly thickened both anteriorly and posteriorly, without discrete bladder wall mass. Bilateral adrenal glands are normal in appearance. Stomach/Bowel: Normal appearance of the stomach. No pathologic dilatation of small bowel or colon. Numerous colonic diverticulae are noted, without surrounding inflammatory changes to suggest an acute diverticulitis at this time. The appendix is not confidently identified and may be surgically absent. Regardless, there are no inflammatory changes noted adjacent to the cecum to suggest the presence of an acute appendicitis at this time. Vascular/Lymphatic: Aortic atherosclerosis. 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 No lymphadenopathy noted in the abdomen or pelvis. Reproductive: Status post hysterectomy. Ovaries are not confidently identified may be surgically absent or atrophic. Other: No significant volume of ascites.  No pneumoperitoneum. Musculoskeletal: There are no aggressive appearing lytic or blastic lesions noted in the visualized portions of the skeleton. IMPRESSION: 1. No ureteral stones. There is persistent mild to moderate left hydronephrosis which terminates at the left ureteropelvic junction, suggesting left UPJ obstruction (particularly in light of the small amount of left perinephric stranding). No obstructing lesion is identified. The possibility of a left UPJ stricture should be considered. Urologic consultation is recommended. 2. Colonic diverticulosis without evidence of acute diverticulitis at this time. 3. 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 4. Additional incidental findings, as above.  Electronically Signed   By: Vinnie Langton M.D.   On: 06/20/2018 16:44   Ct Renal Stone Study  Result Date: 06/19/2018 CLINICAL DATA:  Left-sided flank pain with worsening, nausea and vomiting EXAM: CT ABDOMEN AND PELVIS WITHOUT CONTRAST TECHNIQUE: Multidetector CT imaging of the abdomen and pelvis was performed following the standard protocol without IV contrast. COMPARISON:  None. FINDINGS: Lower chest: Lung bases demonstrate no acute consolidation or effusion. Coronary vascular calcifications. Hepatobiliary: No focal liver abnormality is seen. No gallstones, gallbladder wall thickening, or biliary dilatation. Pancreas: Unremarkable. No pancreatic ductal dilatation or surrounding inflammatory changes. Spleen: Normal in size without focal abnormality. Adrenals/Urinary Tract: Adrenal glands are within normal limits. Moderate left perinephric fat stranding. Mild left hydronephrosis. Left ureter does not appear dilated. No definite ureteral stone. Bladder normal Stomach/Bowel: Stomach  is nonenlarged. No dilated small bowel. Sigmoid colon diverticular disease without acute inflammatory process. Vascular/Lymphatic: Moderate-to-marked aortic atherosclerosis. No aneurysmal dilatation. No significant adenopathy Reproductive: Status post hysterectomy. No adnexal masses. Other: No free air or free fluid Musculoskeletal: No acute or suspicious lesion. Degenerative changes. IMPRESSION: 1. Mild to moderate left hydronephrosis but without hydroureter. No ureteral stones are visualized. Findings could be secondary to recently passed stone. Could consider UPJ configuration with superimposed pyelonephritis. 2. Sigmoid colon diverticular disease without acute inflammatory process. Electronically Signed   By: Donavan Foil M.D.   On: 06/19/2018 14:13    Impression:  84 year old female with moderate to severe left flank pain, microhematuria and left hydronephrosis not present on a CT scan 2 years ago.  A ureteral calculus is  not identified.  She has been afebrile.  Recommendation:  CT findings were discussed with Ms. Axelrod, her husband and daughter.  I recommended left retrograde pyelogram with placement of a left ureteral stent.  Will obtain urine from the left renal pelvis for culture.  The procedure was discussed in detail who potential risks of bleeding, sepsis and ureteral injury.  If stent placement is not successful she would need a percutaneous nephrostomy.  The option of no treatment was discussed but not recommended.  She indicated all questions were answered and desires to proceed.  06/21/2018, 12:22 PM  John Giovanni,  MD

## 2018-06-22 ENCOUNTER — Encounter: Payer: Self-pay | Admitting: Internal Medicine

## 2018-06-22 ENCOUNTER — Encounter: Payer: Self-pay | Admitting: Urology

## 2018-06-22 MED ORDER — POLYETHYLENE GLYCOL 3350 17 G PO PACK
17.0000 g | PACK | Freq: Every day | ORAL | Status: DC
  Administered 2018-06-22 – 2018-06-23 (×2): 17 g via ORAL
  Filled 2018-06-22 (×2): qty 1

## 2018-06-22 MED ORDER — KETOROLAC TROMETHAMINE 30 MG/ML IJ SOLN
15.0000 mg | Freq: Four times a day (QID) | INTRAMUSCULAR | Status: DC | PRN
  Administered 2018-06-22 – 2018-06-23 (×5): 15 mg via INTRAVENOUS
  Filled 2018-06-22 (×5): qty 1

## 2018-06-22 MED ORDER — LEVOFLOXACIN IN D5W 500 MG/100ML IV SOLN
500.0000 mg | INTRAVENOUS | Status: DC
  Administered 2018-06-22: 500 mg via INTRAVENOUS
  Filled 2018-06-22 (×3): qty 100

## 2018-06-22 NOTE — Care Management Important Message (Signed)
Important Message  Patient Details  Name: Kelly Ritter MRN: [id number] Date of Birth: Nov 17, 1933   Medicare Important Message Given:  Yes    Juliann Pulse A Saman Giddens 06/22/2018, 11:24 AM

## 2018-06-22 NOTE — Progress Notes (Signed)
Patient ID: Kelly Ritter, female   DOB: Jul 11, 1934, 84 y.o.   MRN: [id number]   [employer] [facility] PROGRESS NOTE  Kelly Ritter WVP:[id number] DOB: May 04, 1934 DOA: 06/19/2018 PCP: Glean Hess, MD  HPI/Subjective: Patient still having some pain in her abdomen but inside.  The pain in her back seems to subsided.  Patient having redness on the face.  Objective: Vitals:   06/22/18 0502 06/22/18 1317  BP: (!) 161/81 (!) 151/59  Pulse: 80 66  Resp: 20 16  Temp: 97.7 F (36.5 C) 98 F (36.7 C)  SpO2: 96% 96%    Filed Weights   06/19/18 1322  Weight: 70 kg    ROS: Review of Systems  Constitutional: Negative for chills and fever.  Eyes: Negative for blurred vision.  Respiratory: Negative for cough and shortness of breath.   Cardiovascular: Negative for chest pain.  Gastrointestinal: Positive for abdominal pain. Negative for constipation, diarrhea, nausea and vomiting.  Genitourinary: Negative for dysuria.  Musculoskeletal: Negative for back pain and joint pain.  Neurological: Negative for dizziness and headaches.   Exam: Physical Exam  HENT:  Nose: No mucosal edema.  Mouth/Throat: No oropharyngeal exudate or posterior oropharyngeal edema.  Eyes: Pupils are equal, round, and reactive to light. Conjunctivae, EOM and lids are normal.  Neck: No JVD present. Carotid bruit is not present. No edema present. No thyroid mass and no thyromegaly present.  Cardiovascular: S1 normal and S2 normal. Exam reveals no gallop.  No murmur heard. Pulses:      Dorsalis pedis pulses are 2+ on the right side, and 2+ on the left side.  Respiratory: No respiratory distress. She has no wheezes. She has no rhonchi. She has no rales.  GI: Soft. Bowel sounds are normal. There is no tenderness.  Patient feels like the tenderness is on the inside.  Genitourinary:  Genitourinary Comments: No CVA tenderness  Musculoskeletal:       Right shoulder: She exhibits no swelling.  Lymphadenopathy:    She  has no cervical adenopathy.  Neurological: She is alert. No cranial nerve deficit.  Skin: Skin is warm. No rash noted. Nails show no clubbing.  Psychiatric: She has a normal mood and affect.      Data Reviewed: Basic Metabolic Panel: Recent Labs  Lab 06/19/18 1329 06/20/18 0345  NA 139 139  K 3.8 3.6  CL 106 105  CO2 24 27  GLUCOSE 136* 103*  BUN 15 16  CREATININE 0.73 0.84  CALCIUM 9.0 8.5*   CBC: Recent Labs  Lab 06/19/18 1329 06/20/18 0345  WBC 8.2 5.8  HGB 12.8 11.5*  HCT 38.9 36.1  MCV 96.3 96.3  PLT 201 185     Studies: Ct Abdomen Pelvis W Wo Contrast  Result Date: 06/20/2018 CLINICAL DATA:  84 year old female with history of hydronephrosis. Possibly passed a stone. EXAM: CT ABDOMEN AND PELVIS WITHOUT AND WITH CONTRAST TECHNIQUE: Multidetector CT imaging of the abdomen and pelvis was performed following the standard protocol before and following the bolus administration of intravenous contrast. CONTRAST:  143mL ISOVUE-300 IOPAMIDOL (ISOVUE-300) INJECTION 61% COMPARISON:  CT the abdomen and pelvis 06/19/2018. FINDINGS: Lower chest: Mild scarring in the lung bases bilaterally. Atherosclerosis in the descending thoracic aorta and right coronary artery. Hepatobiliary: No suspicious cystic or solid hepatic lesions. No intra or extrahepatic biliary ductal dilatation. Gallbladder is normal in appearance. Pancreas: No pancreatic mass. No pancreatic ductal dilatation. No pancreatic or peripancreatic fluid or inflammatory changes. Spleen: Unremarkable. Adrenals/Urinary Tract: No calcifications are noted  within the collecting system of either kidney, along the course of either ureter, or within the lumen of the urinary bladder. There is mild-to-moderate left hydronephrosis which terminates at the left ureteropelvic junction. Small amount of left perinephric soft tissue stranding. There are no suspicious renal lesions. No filling defects within the collecting system of either kidney,  along the course of either ureter or within the lumen of the urinary bladder on delayed post-contrast images to strongly suggest the presence of urothelial neoplasm at this time. Urinary bladder wall appears mildly thickened both anteriorly and posteriorly, without discrete bladder wall mass. Bilateral adrenal glands are normal in appearance. Stomach/Bowel: Normal appearance of the stomach. No pathologic dilatation of small bowel or colon. Numerous colonic diverticulae are noted, without surrounding inflammatory changes to suggest an acute diverticulitis at this time. The appendix is not confidently identified and may be surgically absent. Regardless, there are no inflammatory changes noted adjacent to the cecum to suggest the presence of an acute appendicitis at this time. Vascular/Lymphatic: Aortic atherosclerosis. 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 No lymphadenopathy noted in the abdomen or pelvis. Reproductive: Status post hysterectomy. Ovaries are not confidently identified may be surgically absent or atrophic. Other: No significant volume of ascites.  No pneumoperitoneum. Musculoskeletal: There are no aggressive appearing lytic or blastic lesions noted in the visualized portions of the skeleton. IMPRESSION: 1. No ureteral stones. There is persistent mild to moderate left hydronephrosis which terminates at the left ureteropelvic junction, suggesting left UPJ obstruction (particularly in light of the small amount of left perinephric stranding). No obstructing lesion is identified. The possibility of a left UPJ stricture should be considered. Urologic consultation is recommended. 2. Colonic diverticulosis without evidence of acute diverticulitis at this time. 3. 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 4. Additional incidental findings, as above. Electronically Signed   By: Vinnie Langton M.D.   On: 06/20/2018 16:44    Scheduled Meds: . lisinopril  10 mg Oral QHS  . nebivolol  5 mg Oral  Daily  . polyethylene glycol  17 g Oral Daily  . rosuvastatin  20 mg Oral q1800   Continuous Infusions: . sodium chloride 50 mL/hr at 06/22/18 1316  . levofloxacin (LEVAQUIN) IV      Assessment/Plan:  1. Left ureteral obstruction with suspected pyelonephritis with back and abdominal pain.  Patient had a stent placed last night by Dr. Bernardo Heater.  Another urine culture was sent.  Switched antibiotics over to Levaquin since the patient having a reaction with a red face. 2. Redness on the face.  Could be a drug reaction.  Stop Toviaz.  Switch Rocephin over to Levaquin.  Patient wants to continue as needed Toradol and oxycodone for now. 3. Hypertension on Bystolic 4. Hyperlipidemia on Crestor 5. History of CAD and peripheral vascular disease on Plavix 6. 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  This will need to be followed over time.  Code Status:     Code Status Orders  (From admission, onward)         Start     Ordered   06/19/18 1608  Do not attempt resuscitation (DNR)  Continuous    Question Answer Comment  In the event of cardiac or respiratory ARREST Do not call a "code blue"   In the event of cardiac or respiratory ARREST Do not perform Intubation, CPR, defibrillation or ACLS   In the event of cardiac or respiratory ARREST Use medication by any route, position, wound care, and  other measures to relive pain and suffering. May use oxygen, suction and manual treatment of airway obstruction as needed for comfort.   Comments nurse may pronounce      06/19/18 1608        Code Status History    Date Active Date Inactive Code Status Order ID Comments User Context   09/24/2015 1828 09/25/2015 1826 Full Code [id number]  Nicholes Mango, MD Inpatient   12/19/2014 1523 12/20/2014 1507 Full Code [id number]  Algernon Huxley, MD Inpatient   12/12/2014 1254 12/13/2014 2004 Full Code [id number]  Bettey Costa, MD Inpatient    Advance Directive Documentation     Most Recent Value  Type of Advance  Directive  Healthcare Power of Attorney  Pre-existing out of facility DNR order (yellow form or pink MOST form)  -  "MOST" Form in Place?  -     Family Communication: Spoke with family today Disposition Plan: Evaluate tomorrow morning  Antibiotics:  Levaquin  Time spent: 32 minutes  [place]

## 2018-06-22 NOTE — Progress Notes (Signed)
Urology Consult Follow Up  Subjective: Patient doing well, pain is subsided.  Remains in house due to concern for allergic reaction, redness and face.  Antibiotics switched.  Anti-infectives: Anti-infectives (From admission, onward)   Start     Dose/Rate Route Frequency Ordered Stop   06/22/18 1400  levofloxacin (LEVAQUIN) IVPB 500 mg     500 mg 100 mL/hr over 60 Minutes Intravenous Every 24 hours 06/22/18 1339     06/20/18 1600  cefTRIAXone (ROCEPHIN) 1 g in sodium chloride 0.9 % 100 mL IVPB  Status:  Discontinued     1 g 200 mL/hr over 30 Minutes Intravenous Every 24 hours 06/19/18 1618 06/22/18 1339   06/19/18 1545  cefTRIAXone (ROCEPHIN) 1 g in sodium chloride 0.9 % 100 mL IVPB     1 g 200 mL/hr over 30 Minutes Intravenous  Once 06/19/18 1538 06/19/18 1628      Current Facility-Administered Medications  Medication Dose Route Frequency Provider Last Rate Last Dose  . 0.9 %  sodium chloride infusion   Intravenous Continuous Stoioff, Scott C, MD 50 mL/hr at 06/22/18 1316    . acetaminophen (TYLENOL) tablet 650 mg  650 mg Oral Q6H PRN Stoioff, Scott C, MD       Or  . acetaminophen (TYLENOL) suppository 650 mg  650 mg Rectal Q6H PRN Stoioff, Scott C, MD      . alum & mag hydroxide-simeth (MAALOX/MYLANTA) 200-200-20 MG/5ML suspension 30 mL  30 mL Oral Q4H PRN Stoioff, Scott C, MD   30 mL at 06/22/18 1730  . ketorolac (TORADOL) 30 MG/ML injection 15 mg  15 mg Intravenous Q6H PRN Loletha Grayer, MD   15 mg at 06/22/18 1430  . levofloxacin (LEVAQUIN) IVPB 500 mg  500 mg Intravenous Q24H Loletha Grayer, MD 100 mL/hr at 06/22/18 1434 500 mg at 06/22/18 1434  . lisinopril (PRINIVIL,ZESTRIL) tablet 10 mg  10 mg Oral QHS Stoioff, Scott C, MD   10 mg at 06/21/18 2113  . nebivolol (BYSTOLIC) tablet 5 mg  5 mg Oral Daily Stoioff, Scott C, MD   5 mg at 06/22/18 0938  . ondansetron (ZOFRAN) tablet 4 mg  4 mg Oral Q6H PRN Stoioff, Scott C, MD       Or  . ondansetron (ZOFRAN) injection 4 mg  4 mg  Intravenous Q6H PRN Stoioff, Ronda Fairly, MD   4 mg at 06/20/18 0647  . oxyCODONE (Oxy IR/ROXICODONE) immediate release tablet 10 mg  10 mg Oral Q4H PRN Stoioff, Scott C, MD   10 mg at 06/22/18 1311  . polyethylene glycol (MIRALAX / GLYCOLAX) packet 17 g  17 g Oral Daily Loletha Grayer, MD   17 g at 06/22/18 0937  . rosuvastatin (CRESTOR) tablet 20 mg  20 mg Oral q1800 Stoioff, Scott C, MD   20 mg at 06/22/18 1726     Objective: Vital signs in last 24 hours: Temp:  [97.7 F (36.5 C)-98 F (36.7 C)] 98 F (36.7 C) (11/11 1317) Pulse Rate:  [66-80] 66 (11/11 1317) Resp:  [16-20] 16 (11/11 1317) BP: (151-176)/(59-85) 151/59 (11/11 1317) SpO2:  [96 %-97 %] 96 % (11/11 1317)  Intake/Output from previous day: 11/10 0701 - 11/11 0700 In: 1350.6 [P.O.:50; I.V.:1200.6; IV Piggyback:100] Out: 0865 [Urine:1850; Blood:2] Intake/Output this shift: Total I/O In: 480 [P.O.:480] Out: 300 [Urine:300]   Physical Exam   NAD, A&O x 3.   Abd soft, NT, no CVA tenderness. No LE edema  Lab Results:  Recent Labs    06/20/18  0345  WBC 5.8  HGB 11.5*  HCT 36.1  PLT 185   BMET Recent Labs    06/20/18 0345  NA 139  K 3.6  CL 105  CO2 27  GLUCOSE 103*  BUN 16  CREATININE 0.84  CALCIUM 8.5*    Assessment: s/p Procedure(s): CYSTOSCOPY WITH STENT PLACEMENT  Plan: -doing well -will arrange for out f/u with Dr. Bernardo Heater to discuss how to manage stent -OK for discharge from Urological perspective -`UCx from 11/8 growing E. Coli, pansensitive recommend 7 day course   Urology will sign off and arrange outpt f/u   LOS: 3 days    Hollice Espy 06/22/2018

## 2018-06-22 NOTE — Plan of Care (Signed)
Called MD once last night for something extra for pain. Toradol 15 mg IV once ordered. Otherwise, patient has received Oxycodone 10 mg prn. Pt pain as low as 5/10 to left flank and as high as 9/10. Pt was able to sleep in between pain meds from approximately 1 am until awakened for vital signs.  Problem: Education: Goal: Knowledge of General Education information will improve Description Including pain rating scale, medication(s)/side effects and non-pharmacologic comfort measures Outcome: Progressing

## 2018-06-22 NOTE — Plan of Care (Signed)

## 2018-06-23 ENCOUNTER — Telehealth: Payer: Self-pay | Admitting: Urology

## 2018-06-23 LAB — URINE CULTURE: CULTURE: NO GROWTH

## 2018-06-23 MED ORDER — OXYBUTYNIN CHLORIDE 5 MG PO TABS: 5.0000 mg | ORAL_TABLET | Freq: Three times a day (TID) | ORAL | Status: DC | PRN

## 2018-06-23 MED ORDER — OXYCODONE HCL 5 MG PO TABS: 5.0000 mg | ORAL_TABLET | ORAL | 0 refills | Status: DC | PRN

## 2018-06-23 MED ORDER — OXYBUTYNIN CHLORIDE 5 MG PO TABS: 5.0000 mg | ORAL_TABLET | Freq: Three times a day (TID) | ORAL | 0 refills | Status: DC | PRN

## 2018-06-23 MED ORDER — LEVOFLOXACIN 500 MG PO TABS: ORAL_TABLET | ORAL | 0 refills | Status: DC

## 2018-06-23 NOTE — Progress Notes (Signed)
Pain medication requested prior to discharge. Pt educated on pain management. Son on site to provide transportation home. Pt stated she would not be driving "for the rest of today".

## 2018-06-23 NOTE — Telephone Encounter (Signed)
Called patient  Had to lm on vm to cb to confirm app   Kelly Ritter

## 2018-06-23 NOTE — Discharge Summary (Signed)
[name]    MR#:  [id number]  DATE OF BIRTH:  1933/08/31  DATE OF ADMISSION:  06/19/2018 ADMITTING PHYSICIAN: Loletha Grayer, MD  DATE OF DISCHARGE: 06/23/2018  PRIMARY CARE PHYSICIAN: Glean Hess, MD    ADMISSION DIAGNOSIS:  Pyelonephritis [N12]  DISCHARGE DIAGNOSIS:  Active Problems:   Pyelonephritis   SECONDARY DIAGNOSIS:   Past Medical History:  Diagnosis Date  . CAD (coronary artery disease)    s/p PCI and stent placement of circumflex and LAD and RCA.  restenosis of RCA 2014 with drug eluting stent  . Collagen vascular disease ([place])   . Hiatal hernia   . Hypercholesterolemia   . Hypertension   . Peripheral vascular disease ([place])     HOSPITAL COURSE:   1.  Left ureteral obstruction with hydronephrosis with suspected pyelonephritis with back and abdominal pain.  The patient was initially started on antibiotics.  Initial urine culture grew only 10,000 E. coli which was pansensitive.  The patient was still having a lot of pain and patient had a stent by Dr. Bernardo Heater on 06/21/2018.  Patient was still having some discomfort but has improved since.  Antibiotics were switched from Rocephin over the Levaquin because she had a red face and I am not sure if that was a drug reaction.  Will need urology follow-up as outpatient in order to set up a plan for stent removal and follow-up after that. 2.  Redness on the face.  Could be a drug reaction antibiotics were switched and we held [place].  We will stop Toradol that was given in the hospital and continue low-dose oxycodone only as needed. 3.  Hypertension on Bystolic 4.  Hyperlipidemia unspecified on Crestor 5.  History of CAD and peripheral vascular disease can go back on Plavix 6.  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  This will have to be followed over time.  DISCHARGE CONDITIONS:   Satisfactory  CONSULTS OBTAINED:   Treatment Team:  Abbie Sons, MD  DRUG ALLERGIES:   Allergies  Allergen Reactions  . Sulfa Antibiotics Itching, Swelling, Rash and Hives    Swelling    . Latex Rash, Hives and Other (See Comments)    Skin eruptions    . Baclofen Other (See Comments)    Muscle spasms and cramps in shoulder area  . Donepezil Other (See Comments)    DISCHARGE MEDICATIONS:   Allergies as of 06/23/2018      Reactions   Sulfa Antibiotics Itching, Swelling, Rash, Hives   Swelling    Latex Rash, Hives, Other (See Comments)   Skin eruptions    Baclofen Other (See Comments)   Muscle spasms and cramps in shoulder area   Donepezil Other (See Comments)      Medication List    STOP taking these medications   metaxalone 800 MG tablet Commonly known as:  SKELAXIN   traMADol 50 MG tablet Commonly known as:  ULTRAM   valACYclovir 1000 MG tablet Commonly known as:  VALTREX     TAKE these medications   acetaminophen 650 MG CR tablet Commonly known as:  TYLENOL Take 650 mg by mouth at bedtime as needed for pain.   B-12 500 MCG Tabs Take 1 tablet by mouth daily. What changed:  how much to take   clopidogrel 75 MG tablet Commonly known as:  PLAVIX Take 75 mg by mouth at bedtime.  levofloxacin 500 MG tablet Commonly known as:  LEVAQUIN Take one tab po daily at 4pm   lisinopril 10 MG tablet Commonly known as:  PRINIVIL,ZESTRIL Take 10 mg by mouth at bedtime.   nebivolol 5 MG tablet Commonly known as:  BYSTOLIC Take 5 mg by mouth daily.   oxybutynin 5 MG tablet Commonly known as:  DITROPAN Take 1 tablet (5 mg total) by mouth every 8 (eight) hours as needed for bladder spasms.   oxyCODONE 5 MG immediate release tablet Commonly known as:  Oxy IR/ROXICODONE Take 1 tablet (5 mg total) by mouth every 4 (four) hours as needed for severe pain.   potassium gluconate 595 (99 K) MG Tabs tablet Take 595 mg by mouth at bedtime as needed (leg cramps).   rosuvastatin 20 MG  tablet Commonly known as:  CRESTOR Take 20 mg by mouth at bedtime.        DISCHARGE INSTRUCTIONS:   Follow-up PMD 5 days Follow-up Dr. Bernardo Heater urology as outpatient for further stent management.  If you experience worsening of your admission symptoms, develop shortness of breath, life threatening emergency, suicidal or homicidal thoughts you must seek medical attention immediately by calling 911 or calling your MD immediately  if symptoms less severe.  You Must read complete instructions/literature along with all the possible adverse reactions/side effects for all the Medicines you take and that have been prescribed to you. Take any new Medicines after you have completely understood and accept all the possible adverse reactions/side effects.   Please note  You were cared for by a hospitalist during your hospital stay. If you have any questions about your discharge medications or the care you received while you were in the hospital after you are discharged, you can call the unit and asked to speak with the hospitalist on call if the hospitalist that took care of you is not available. Once you are discharged, your primary care physician will handle any further medical issues. Please note that NO REFILLS for any discharge medications will be authorized once you are discharged, as it is imperative that you return to your primary care physician (or establish a relationship with a primary care physician if you do not have one) for your aftercare needs so that they can reassess your need for medications and monitor your lab values.    Today   CHIEF COMPLAINT:   Chief Complaint  Patient presents with  . Flank Pain  . Dizziness    HISTORY OF PRESENT ILLNESS:  Kelly Ritter  is a 84 y.o. female presented with abdominal pain and back pain   VITAL SIGNS:  Blood pressure (!) 145/65, pulse 71, temperature 98.2 F (36.8 C), temperature source Oral, resp. rate 16, height 5\' 4"  (1.626 m),  weight 70 kg, SpO2 94 %.   PHYSICAL EXAMINATION:  GENERAL:  84 y.o.-year-old patient lying in the bed with no acute distress.  EYES: Pupils equal, round, reactive to light and accommodation. No scleral icterus. Extraocular muscles intact.  HEENT: Head atraumatic, normocephalic. Oropharynx and nasopharynx clear.  NECK:  Supple, no jugular venous distention. No thyroid enlargement, no tenderness.  LUNGS: Normal breath sounds bilaterally, no wheezing, rales,rhonchi or crepitation. No use of accessory muscles of respiration.  CARDIOVASCULAR: S1, S2 normal. No murmurs, rubs, or gallops.  ABDOMEN: Soft, non-tender, non-distended. Bowel sounds present. No organomegaly or mass.  EXTREMITIES: No pedal edema, cyanosis, or clubbing.  NEUROLOGIC: Cranial nerves II through XII are intact. Muscle strength 5/5 in all extremities. Sensation intact. Gait  not checked.  PSYCHIATRIC: The patient is alert and oriented x 3.  SKIN: No obvious rash, lesion, or ulcer.   DATA REVIEW:   CBC Recent Labs  Lab 06/20/18 0345  WBC 5.8  HGB 11.5*  HCT 36.1  PLT 185    Chemistries  Recent Labs  Lab 06/20/18 0345  NA 139  K 3.6  CL 105  CO2 27  GLUCOSE 103*  BUN 16  CREATININE 0.84  CALCIUM 8.5*     Microbiology Results  Results for orders placed or performed during the hospital encounter of 06/19/18  Urine Culture     Status: Abnormal   Collection Time: 06/19/18  1:13 PM  Result Value Ref Range Status   Specimen Description   Final    URINE, RANDOM Performed at [facility], [address] [place], [place] [address]    Special Requests   Final    NONE Performed at [facility], [address] [place], [place] [address]    Culture 10,000 COLONIES/mL ESCHERICHIA COLI (A)  Final   Report Status 06/21/2018 FINAL  Final   Organism ID, Bacteria ESCHERICHIA COLI (A)  Final      Susceptibility   Escherichia coli - MIC*    AMPICILLIN 4 SENSITIVE Sensitive     CEFAZOLIN <=4  SENSITIVE Sensitive     CEFTRIAXONE <=1 SENSITIVE Sensitive     CIPROFLOXACIN <=0.25 SENSITIVE Sensitive     GENTAMICIN <=1 SENSITIVE Sensitive     IMIPENEM <=0.25 SENSITIVE Sensitive     NITROFURANTOIN <=16 SENSITIVE Sensitive     TRIMETH/SULFA <=20 SENSITIVE Sensitive     AMPICILLIN/SULBACTAM <=2 SENSITIVE Sensitive     PIP/TAZO <=4 SENSITIVE Sensitive     Extended ESBL NEGATIVE Sensitive     * 10,000 COLONIES/mL ESCHERICHIA COLI  Urine Culture     Status: None   Collection Time: 06/21/18  5:37 PM  Result Value Ref Range Status   Specimen Description   Final    CYSTOSCOPY Performed at [facility], [place] [address] [place], [place] [address]    Special Requests   Final    NONE Performed at [facility], [address] [place], [place] [address]    Culture   Final    NO GROWTH Performed at [facility], [place] [address], [place], [place] [address]    Report Status 06/23/2018 FINAL  Final     Management plans discussed with the patient, and she is are in agreement.  Spoke with family yesterday.  CODE STATUS:     Code Status Orders  (From admission, onward)         Start     Ordered   06/19/18 1608  Do not attempt resuscitation (DNR)  Continuous    Question Answer Comment  In the event of cardiac or respiratory ARREST Do not call a "code blue"   In the event of cardiac or respiratory ARREST Do not perform Intubation, CPR, defibrillation or ACLS   In the event of cardiac or respiratory ARREST Use medication by any route, position, wound care, and other measures to relive pain and suffering. May use oxygen, suction and manual treatment of airway obstruction as needed for comfort.   Comments nurse may pronounce      06/19/18 1608        Code Status History    Date Active Date Inactive Code Status Order ID Comments User Context   09/24/2015 1828 09/25/2015 1826 Full Code [id number]  Nicholes Mango, MD Inpatient   12/19/2014 1523 12/20/2014 1507 Full Code  [id number]  Algernon Huxley, MD Inpatient   12/12/2014 1254 12/13/2014 2004 Full Code [id number]  Bettey Costa, MD Inpatient    Advance Directive Documentation     Most Recent Value  Type of Advance Directive  Healthcare Power of Attorney  Pre-existing out of facility DNR order (yellow form or pink MOST form)  -  "MOST" Form in Place?  -      TOTAL TIME TAKING CARE OF THIS PATIENT: 35 minutes.    Loletha Grayer M.D on 06/23/2018 at 1:17 PM  Between 7am to 6pm - Pager - [phone]  After 6pm go to [email or web address] - password [email or web address] [facility]  [phone]  CC: Primary care physician; Glean Hess, MD

## 2018-06-23 NOTE — Telephone Encounter (Signed)
-----   Message from Hollice Espy, MD sent at 06/22/2018  7:04 PM EST ----- Regarding: f/u with Stoioff This is a Dr. Bernardo Heater patient whom he stented over the weekend.  She needs to be seen in his clinic within 1 week to discuss management of her stent.  Hollice Espy, MD

## 2018-06-23 NOTE — Discharge Instructions (Signed)
Pyelonephritis, Adult Pyelonephritis is a kidney infection. The kidneys are organs that help clean your blood by moving waste out of your blood and into your pee (urine). This infection can happen quickly, or it can last for a long time. In most cases, it clears up with treatment and does not cause other problems. Follow these instructions at home: Medicines  Take over-the-counter and prescription medicines only as told by your doctor.  Take your antibiotic medicine as told by your doctor. Do not stop taking the medicine even if you start to feel better. General instructions  Drink enough fluid to keep your pee clear or pale yellow.  Avoid caffeine, tea, and carbonated drinks.  Pee (urinate) often. Avoid holding in pee for long periods of time.  Pee before and after sex.  After pooping (having a bowel movement), women should wipe from front to back. Use each tissue only once.  Keep all follow-up visits as told by your doctor. This is important. Contact a doctor if:  You do not feel better after 2 days.  Your symptoms get worse.  You have a fever. Get help right away if:  You cannot take your medicine or drink fluids as told.  You have chills and shaking.  You throw up (vomit).  You have very bad pain in your side (flank) or back.  You feel very weak or you pass out (faint). This information is not intended to replace advice given to you by your health care provider. Make sure you discuss any questions you have with your health care provider. Document Released: 09/05/2004 Document Revised: 01/04/2016 Document Reviewed: 11/21/2014 Elsevier Interactive Patient Education  2018 [employer]  

## 2018-06-23 NOTE — Progress Notes (Signed)
Pt discharged home with transportation provided by son. Discharge instructions explained. No questions at time of discharge.

## 2018-06-24 ENCOUNTER — Telehealth: Payer: Self-pay

## 2018-06-24 NOTE — Telephone Encounter (Signed)
Transition Care Management Follow-up Telephone Call  Date of discharge and from where: 06/23/18 [facility]  How have you been since you were released from the hospital? Feeling better since discharge; husband was also hospitalized at same time due to pneumonia  Any questions or concerns? No   Items Reviewed:  Did the pt receive and understand the discharge instructions provided? No   Medications obtained and verified? Yes   Any new allergies since your discharge? No   Dietary orders reviewed? Yes  Do you have support at home? Yes  pt's daughter available for assistance  Functional Questionnaire: (I = Independent and D = Dependent) ADLs: I  Bathing/Dressing- I  Meal Prep- I  Eating- I  Maintaining continence- I  Transferring/Ambulation- I  Managing Meds- I  Follow up appointments reviewed:   PCP Hospital f/u appt confirmed? Yes  Scheduled to see Dr. Army Melia on 06/30/18 @ 3:00.  [facility] f/u appt confirmed? Yes  Scheduled to see Dr. Bernardo Heater on 07/01/18 @ 1:45.  Are transportation arrangements needed? No   If their condition worsens, is the pt aware to call PCP or go to the Emergency Dept.? Yes  Was the patient provided with contact information for the PCP's office or ED? Yes  Was to pt encouraged to call back with questions or concerns? Yes

## 2018-06-29 ENCOUNTER — Ambulatory Visit (INDEPENDENT_AMBULATORY_CARE_PROVIDER_SITE_OTHER): Payer: Medicare Other | Admitting: Internal Medicine

## 2018-06-29 ENCOUNTER — Encounter: Payer: Self-pay | Admitting: Internal Medicine

## 2018-06-29 VITALS — BP 128/64 | HR 72 | Ht 64.0 in | Wt 164.0 lb

## 2018-06-29 DIAGNOSIS — I10 Essential (primary) hypertension: Secondary | ICD-10-CM

## 2018-06-29 DIAGNOSIS — Q6211 Congenital occlusion of ureteropelvic junction: Secondary | ICD-10-CM

## 2018-06-29 DIAGNOSIS — F411 Generalized anxiety disorder: Secondary | ICD-10-CM

## 2018-06-29 MED ORDER — CITALOPRAM HYDROBROMIDE 10 MG PO TABS: 10.0000 mg | ORAL_TABLET | Freq: Every day | ORAL | 2 refills | Status: DC

## 2018-06-29 NOTE — Progress Notes (Signed)
Date:  06/29/2018   Name:  Kelly Ritter   DOB:  April 14, 1934   MRN:  [id number]   Chief Complaint: Hospitalization Follow-up (Patient came with daughter Nira Conn today. Still having pain. Has follow up on Wednesday. Having urgency but not a lot of urine coming out.  ) Hospital follow up from [facility].  Admitted 06/19/18 to 06/23/18 for severe back pain due to hydronephrosis.  She received TOC call on  06/24/18. She was treated with antibiotics and ultimately has a ureteral stent placed on the left.  She was discharged to home on oral antibiotics - Levaquin for 4 days. She will see Urology on 07/01/18 for possible stent removal. Urine culture 10,000 col/ml E coli - pan sensitive.  HPI - since being home she has done well.  Still has some pain but nothing like before the stent procedure.  She is taking ibuprofen as needed. She has some Celexa that took when her son died and has taken it for the past few days.  Her husband is not doing well physically or emotionally.  Review of Systems  Constitutional: Negative for chills, fatigue and fever.  Respiratory: Negative for chest tightness, shortness of breath and wheezing.   Cardiovascular: Positive for leg swelling. Negative for chest pain and palpitations.  Gastrointestinal: Negative for anal bleeding, constipation and diarrhea.  Genitourinary: Positive for flank pain (mild). Negative for dysuria, hematuria, pelvic pain and urgency.  Musculoskeletal: Positive for back pain.  Skin: Negative for color change and rash.  Neurological: Negative for dizziness, light-headedness and headaches.  Hematological: Negative for adenopathy.  Psychiatric/Behavioral: Positive for dysphoric mood. Negative for sleep disturbance. The patient is nervous/anxious.     Patient Active Problem List   Diagnosis Date Noted  . Pyelonephritis 06/19/2018  . Osteoporosis of forearm 04/02/2018  . Closed fracture of lateral malleolus 01/13/2018  . B12 deficiency 08/14/2017    . Prediabetes 08/13/2017  . Coronary artery disease 05/06/2017  . Pure hypercholesterolemia 05/06/2017  . SOBOE (shortness of breath on exertion) 02/26/2017  . Anxiety, generalized 02/03/2017  . Post-concussion headache 02/03/2017  . Elevated TSH 10/22/2016  . Hematuria, microscopic 10/22/2016  . Primary osteoarthritis of hand 01/09/2016  . Arthralgia of multiple sites 12/18/2015  . Chronic hand pain, right 12/18/2015  . Encounter for long-term (current) use of high-risk medication 12/18/2015  . RSD (reflex sympathetic dystrophy) 12/18/2015  . Chest pain 09/24/2015  . Personal history of other malignant neoplasm of skin 08/02/2015  . Benign essential hypertension 01/06/2015  . 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 CVA (cerebral infarction) 12/12/2014  . Bilateral carotid artery stenosis 10/21/2014  . Other allergic rhinitis 10/21/2014  . Paroxysmal supraventricular tachycardia ([place]) 07/05/2014    Allergies  Allergen Reactions  . Sulfa Antibiotics Itching, Swelling, Rash and Hives    Swelling    . Latex Rash, Hives and Other (See Comments)    Skin eruptions    . Baclofen Other (See Comments)    Muscle spasms and cramps in shoulder area  . Donepezil Other (See Comments)    Past Surgical History:  Procedure Laterality Date  . ABDOMINAL HYSTERECTOMY  1979  . APPENDECTOMY  1956  . CARDIAC CATHETERIZATION N/A 09/25/2015   Procedure: Left Heart Cath and Coronary Angiography;  Surgeon: Teodoro Spray, MD;  Location: [facility] [facility];  Service: Cardiovascular;  Laterality: N/A;  . CAROTID STENT Left 2014  . CYSTOSCOPY WITH STENT PLACEMENT Left 06/21/2018   Procedure: CYSTOSCOPY WITH STENT PLACEMENT;  Surgeon: John Giovanni  C, MD;  Location: [facility];  Service: Urology;  Laterality: Left;  . EYE SURGERY  2010   cataracts  . JOINT REPLACEMENT     knee replacement  . PERIPHERAL VASCULAR CATHETERIZATION Left 12/19/2014   Procedure: Carotid PTA/Stent Intervention;  Surgeon:  Algernon Huxley, MD;  Location: [place];  Service: Cardiovascular;  Laterality: Left;    Social History   Tobacco Use  . Smoking status: Former Smoker    Packs/day: 1.00    Years: 8.00    Pack years: 8.00    Types: Cigarettes    Last attempt to quit: 08/12/1989    Years since quitting: 28.8  . Smokeless tobacco: Former Systems developer  . Tobacco comment: smoking cessation materials not required  Substance Use Topics  . Alcohol use: Not Currently    Alcohol/week: 0.0 standard drinks  . Drug use: Never     Medication list has been reviewed and updated.  Current Meds  Medication Sig  . acetaminophen (TYLENOL) 650 MG CR tablet Take 650 mg by mouth at bedtime as needed for pain.   Marland Kitchen clopidogrel (PLAVIX) 75 MG tablet Take 75 mg by mouth at bedtime.   . Cyanocobalamin (B-12) 500 MCG TABS Take 1 tablet by mouth daily. (Patient taking differently: Take 500 mcg by mouth daily. )  . levofloxacin (LEVAQUIN) 500 MG tablet Take one tab po daily at 4pm  . lisinopril (PRINIVIL,ZESTRIL) 10 MG tablet Take 10 mg by mouth at bedtime.   . nebivolol (BYSTOLIC) 5 MG tablet Take 5 mg by mouth daily.  Marland Kitchen oxybutynin (DITROPAN) 5 MG tablet Take 1 tablet (5 mg total) by mouth every 8 (eight) hours as needed for bladder spasms.  Marland Kitchen oxyCODONE (OXY IR/ROXICODONE) 5 MG immediate release tablet Take 1 tablet (5 mg total) by mouth every 4 (four) hours as needed for severe pain.  . potassium gluconate 595 MG TABS tablet Take 595 mg by mouth at bedtime as needed (leg cramps).  . rosuvastatin (CRESTOR) 20 MG tablet Take 20 mg by mouth at bedtime.    PHQ 2/9 Scores 03/18/2018 08/13/2017  PHQ - 2 Score 0 0  PHQ- 9 Score 0 3    Physical Exam  Constitutional: She is oriented to person, place, and time. She appears well-developed. No distress.  HENT:  Head: Normocephalic and atraumatic.  Neck: Normal range of motion. Neck supple.  Cardiovascular: Normal rate, regular rhythm and normal heart sounds.  Pulmonary/Chest:  Effort normal and breath sounds normal. No respiratory distress.  Musculoskeletal: Normal range of motion.  Neurological: She is alert and oriented to person, place, and time.  Skin: Skin is warm and dry. No rash noted.  Psychiatric: She has a normal mood and affect. Her behavior is normal. Thought content normal.  Nursing note and vitals reviewed.   BP 128/64 (BP Location: Right Arm, Patient Position: Sitting, Cuff Size: Normal)   Pulse 72   Ht 5\' 4"  (1.626 m)   Wt 164 lb (74.4 kg)   SpO2 97%   BMI 28.15 kg/m   Assessment and Plan: 1. Hydronephrosis with ureteropelvic junction (UPJ) obstruction Improving after stent Finish antibiotics and follow up with Urology  2. Anxiety, generalized Resume celexa 10 mg daily Follow up if no improvement - citalopram (CELEXA) 10 MG tablet; Take 1 tablet (10 mg total) by mouth daily.  Dispense: 30 tablet; Refill: 2  3. Benign essential hypertension controlled   Partially dictated using Editor, commissioning. Any errors are unintentional.  Halina Maidens, MD [facility]  [facility]  06/29/2018

## 2018-06-30 ENCOUNTER — Encounter: Payer: Self-pay | Admitting: Urology

## 2018-06-30 ENCOUNTER — Inpatient Hospital Stay: Payer: Medicare Other | Admitting: Internal Medicine

## 2018-07-01 ENCOUNTER — Ambulatory Visit (INDEPENDENT_AMBULATORY_CARE_PROVIDER_SITE_OTHER): Payer: Medicare Other | Admitting: Urology

## 2018-07-01 ENCOUNTER — Encounter: Payer: Self-pay | Admitting: Urology

## 2018-07-01 VITALS — BP 174/80 | HR 75 | Ht 65.0 in | Wt 163.4 lb

## 2018-07-01 DIAGNOSIS — N1330 Unspecified hydronephrosis: Secondary | ICD-10-CM | POA: Diagnosis not present

## 2018-07-01 DIAGNOSIS — R3129 Other microscopic hematuria: Secondary | ICD-10-CM | POA: Diagnosis not present

## 2018-07-01 DIAGNOSIS — R600 Localized edema: Secondary | ICD-10-CM | POA: Diagnosis not present

## 2018-07-01 LAB — URINALYSIS, COMPLETE
BILIRUBIN UA: NEGATIVE
GLUCOSE, UA: NEGATIVE
Nitrite, UA: NEGATIVE
PH UA: 5 (ref 5.0–7.5)
Specific Gravity, UA: >1.03 — ABNORMAL HIGH (ref 1.005–1.030)
Urobilinogen, Ur: 0.2 mg/dL (ref 0.2–1.0)

## 2018-07-01 LAB — MICROSCOPIC EXAMINATION: RBC, UA: >30 /hpf — ABNORMAL HIGH (ref 0–2)

## 2018-07-01 NOTE — H&P (View-Only) (Signed)
07/01/2018 3:08 PM   Kelly Ritter Kelly Ritter February 01, 1934 [id number]  Referring provider: Glean Hess, MD [address] [place], [name]  Chief Complaint  Patient presents with  . Hospitalization Follow-up    HPI: 84 year old female seen in consultation for hospitalization November 2019 for left flank pain and presumed pyelonephritis.  CT showed left hydronephrosis consistent with obstruction at the UPJ.  CT a few years prior showed no evidence of hydronephrosis.  She underwent cystoscopy with left retrograde pyelogram which showed no filling defect.  There was bloody urine in the renal pelvis which was negative for infection.  Her flank pain resolved with stent placement.  She presents today for follow-up.  She has moderate stent irritative symptoms.  Denies fever or chills.  She states she has had bilateral lower extremity edema since being discharged from the hospital.  She has no pain.  She has a history of CVA and is on Plavix.   PMH: Past Medical History:  Diagnosis Date  . CAD (coronary artery disease)    s/p PCI and stent placement of circumflex and LAD and RCA.  restenosis of RCA 2014 with drug eluting stent  . Collagen vascular disease ([place])   . Hiatal hernia   . Hypercholesterolemia   . Hypertension   . Peripheral vascular disease [facility])     Surgical History: Past Surgical History:  Procedure Laterality Date  . ABDOMINAL HYSTERECTOMY  1979  . APPENDECTOMY  1956  . CARDIAC CATHETERIZATION N/A 09/25/2015   Procedure: Left Heart Cath and Coronary Angiography;  Surgeon: Teodoro Spray, MD;  Location: [place] [facility];  Service: Cardiovascular;  Laterality: N/A;  . CAROTID STENT Left 2014  . CYSTOSCOPY WITH STENT PLACEMENT Left 06/21/2018   Procedure: CYSTOSCOPY WITH STENT PLACEMENT;  Surgeon: Abbie Sons, MD;  Location: [facility];  Service: Urology;  Laterality: Left;  . EYE SURGERY  2010   cataracts  . JOINT REPLACEMENT     knee replacement  .  PERIPHERAL VASCULAR CATHETERIZATION Left 12/19/2014   Procedure: Carotid PTA/Stent Intervention;  Surgeon: Algernon Huxley, MD;  Location: [place] [facility];  Service: Cardiovascular;  Laterality: Left;    Home Medications:  Allergies as of 07/01/2018      Reactions   Sulfa Antibiotics Itching, Swelling, Rash, Hives   Swelling    Latex Rash, Hives, Other (See Comments)   Skin eruptions    Baclofen Other (See Comments)   Muscle spasms and cramps in shoulder area   Donepezil Other (See Comments)      Medication List        Accurate as of 07/01/18  3:08 PM. Always use your most recent med list.          acetaminophen 650 MG CR tablet Commonly known as:  TYLENOL Take 650 mg by mouth at bedtime as needed for pain.   B-12 500 MCG Tabs Take 1 tablet by mouth daily.   citalopram 10 MG tablet Commonly known as:  CELEXA Take 1 tablet (10 mg total) by mouth daily.   clopidogrel 75 MG tablet Commonly known as:  PLAVIX Take 75 mg by mouth at bedtime.   levofloxacin 500 MG tablet Commonly known as:  LEVAQUIN Take one tab po daily at 4pm   lisinopril 10 MG tablet Commonly known as:  PRINIVIL,ZESTRIL Take 10 mg by mouth at bedtime.   nebivolol 5 MG tablet Commonly known as:  BYSTOLIC Take 5 mg by mouth daily.   oxybutynin 5  MG tablet Commonly known as:  DITROPAN Take 1 tablet (5 mg total) by mouth every 8 (eight) hours as needed for bladder spasms.   oxyCODONE 5 MG immediate release tablet Commonly known as:  Oxy IR/ROXICODONE Take 1 tablet (5 mg total) by mouth every 4 (four) hours as needed for severe pain.   potassium gluconate 595 (99 K) MG Tabs tablet Take 595 mg by mouth at bedtime as needed (leg cramps).   rosuvastatin 20 MG tablet Commonly known as:  CRESTOR Take 20 mg by mouth at bedtime.       Allergies:  Allergies  Allergen Reactions  . Sulfa Antibiotics Itching, Swelling, Rash and Hives    Swelling    . Latex Rash, Hives and Other (See Comments)     Skin eruptions    . Baclofen Other (See Comments)    Muscle spasms and cramps in shoulder area  . Donepezil Other (See Comments)    Family History: Family History  Problem Relation Age of Onset  . Heart disease Mother   . Hypertension Brother   . Heart disease Brother 48       several MIs  . Heart disease Daughter 48       deceased from MI  . Heart disease Son     Social History:  reports that she quit smoking about 28 years ago. Her smoking use included cigarettes. She has a 8.00 pack-year smoking history. She has quit using smokeless tobacco. She reports that she drank alcohol. She reports that she does not use drugs.  ROS: UROLOGY Frequent Urination?: No Hard to postpone urination?: No Burning/pain with urination?: Yes Get up at night to urinate?: No Leakage of urine?: No Urine stream starts and stops?: No Trouble starting stream?: No Do you have to strain to urinate?: Yes Blood in urine?: Yes Urinary tract infection?: No Sexually transmitted disease?: No Injury to kidneys or bladder?: No Painful intercourse?: No Weak stream?: No Currently pregnant?: No Vaginal bleeding?: No Last menstrual period?: Hysterectomy  Gastrointestinal Nausea?: No Vomiting?: No Indigestion/heartburn?: Yes Diarrhea?: No Constipation?: No  Constitutional Fever: No Night sweats?: No Weight loss?: No Fatigue?: Yes  Skin Skin rash/lesions?: No Itching?: No  Eyes Blurred vision?: No Double vision?: No  Ears/Nose/Throat Sore throat?: No Sinus problems?: No  Hematologic/Lymphatic Swollen glands?: No Easy bruising?: Yes  Cardiovascular Leg swelling?: Yes Chest pain?: No  Respiratory Cough?: No Shortness of breath?: No  Endocrine Excessive thirst?: Yes  Musculoskeletal Back pain?: Yes Joint pain?: Yes  Neurological Headaches?: Yes Dizziness?: No  Psychologic Depression?: No Anxiety?: No  Physical Exam: BP (!) 174/80 (BP Location: Left Arm, Patient  Position: Sitting, Cuff Size: Normal)   Pulse 75   Ht 5\' 5"  (1.651 m)   Wt 163 lb 6.4 oz (74.1 kg)   BMI 27.19 kg/m   Constitutional:  Alert and oriented, No acute distress. HEENT: [place] AT, moist mucus membranes.  Trachea midline, no masses. Cardiovascular: No clubbing, cyanosis, or edema.  RRR Respiratory: Normal respiratory effort, no increased work of breathing.  Lungs clear GI: Abdomen is soft, nontender, nondistended, no abdominal masses GU: No CVA tenderness Lymph: No cervical or inguinal lymphadenopathy.  Bilateral lower extremity edema right greater than left Skin: No rashes, bruises or suspicious lesions. Neurologic: Grossly intact, no focal deficits, moving all 4 extremities. Psychiatric: Normal mood and affect.   Assessment & Plan:   84 year old female with a history of left flank pain and obstruction at the U PJ.  The etiology of her  obstruction has not been identified.  Her urine from the renal pelvis was negative for infection.  I recommended scheduling cystoscopy with stent removal and left ureteral-pyeloscopy to evaluate for any evidence of tumor as a cause of her bleeding.  Cytologies from the renal pelvis will be collected and the possible need for biopsy was discussed if any abnormalities are identified.  The procedure was cussed in detail including potential risks of bleeding, infection/sepsis, ureteral injury and injury to the collecting system.  The low chance of ureteral stricture was discussed.  She indicated all questions were answered and desires to schedule.  A serum creatinine was drawn and Doppler ultrasound for evaluation of her lower extremity edema.  She was given Myrbetriq samples 25 mg daily for her stent symptoms.   Abbie Sons, [place] [address], [place],  [address] [phone]

## 2018-07-01 NOTE — Progress Notes (Signed)
07/01/2018 3:08 PM   Rise Paganini Kelly Ritter 1934/04/27 [id number]  Referring provider: Glean Hess, MD [address] [place], [place] [address]  Chief Complaint  Patient presents with  . Hospitalization Follow-up    HPI: 84 year old female seen in consultation for hospitalization November 2019 for left flank pain and presumed pyelonephritis.  CT showed left hydronephrosis consistent with obstruction at the UPJ.  CT a few years prior showed no evidence of hydronephrosis.  She underwent cystoscopy with left retrograde pyelogram which showed no filling defect.  There was bloody urine in the renal pelvis which was negative for infection.  Her flank pain resolved with stent placement.  She presents today for follow-up.  She has moderate stent irritative symptoms.  Denies fever or chills.  She states she has had bilateral lower extremity edema since being discharged from the hospital.  She has no pain.  She has a history of CVA and is on Plavix.   PMH: Past Medical History:  Diagnosis Date  . CAD (coronary artery disease)    s/p PCI and stent placement of circumflex and LAD and RCA.  restenosis of RCA 2014 with drug eluting stent  . Collagen vascular disease ([place])   . Hiatal hernia   . Hypercholesterolemia   . Hypertension   . Peripheral vascular disease [facility])     Surgical History: Past Surgical History:  Procedure Laterality Date  . ABDOMINAL HYSTERECTOMY  1979  . APPENDECTOMY  1956  . CARDIAC CATHETERIZATION N/A 09/25/2015   Procedure: Left Heart Cath and Coronary Angiography;  Surgeon: Teodoro Spray, MD;  Location: [place] [facility];  Service: Cardiovascular;  Laterality: N/A;  . CAROTID STENT Left 2014  . CYSTOSCOPY WITH STENT PLACEMENT Left 06/21/2018   Procedure: CYSTOSCOPY WITH STENT PLACEMENT;  Surgeon: Abbie Sons, MD;  Location: [facility];  Service: Urology;  Laterality: Left;  . EYE SURGERY  2010   cataracts  . JOINT REPLACEMENT     knee replacement  .  PERIPHERAL VASCULAR CATHETERIZATION Left 12/19/2014   Procedure: Carotid PTA/Stent Intervention;  Surgeon: Algernon Huxley, MD;  Location: [place] [facility];  Service: Cardiovascular;  Laterality: Left;    Home Medications:  Allergies as of 07/01/2018      Reactions   Sulfa Antibiotics Itching, Swelling, Rash, Hives   Swelling    Latex Rash, Hives, Other (See Comments)   Skin eruptions    Baclofen Other (See Comments)   Muscle spasms and cramps in shoulder area   Donepezil Other (See Comments)      Medication List        Accurate as of 07/01/18  3:08 PM. Always use your most recent med list.          acetaminophen 650 MG CR tablet Commonly known as:  TYLENOL Take 650 mg by mouth at bedtime as needed for pain.   B-12 500 MCG Tabs Take 1 tablet by mouth daily.   citalopram 10 MG tablet Commonly known as:  CELEXA Take 1 tablet (10 mg total) by mouth daily.   clopidogrel 75 MG tablet Commonly known as:  PLAVIX Take 75 mg by mouth at bedtime.   levofloxacin 500 MG tablet Commonly known as:  LEVAQUIN Take one tab po daily at 4pm   lisinopril 10 MG tablet Commonly known as:  PRINIVIL,ZESTRIL Take 10 mg by mouth at bedtime.   nebivolol 5 MG tablet Commonly known as:  BYSTOLIC Take 5 mg by mouth daily.   oxybutynin 5  MG tablet Commonly known as:  DITROPAN Take 1 tablet (5 mg total) by mouth every 8 (eight) hours as needed for bladder spasms.   oxyCODONE 5 MG immediate release tablet Commonly known as:  Oxy IR/ROXICODONE Take 1 tablet (5 mg total) by mouth every 4 (four) hours as needed for severe pain.   potassium gluconate 595 (99 K) MG Tabs tablet Take 595 mg by mouth at bedtime as needed (leg cramps).   rosuvastatin 20 MG tablet Commonly known as:  CRESTOR Take 20 mg by mouth at bedtime.       Allergies:  Allergies  Allergen Reactions  . Sulfa Antibiotics Itching, Swelling, Rash and Hives    Swelling    . Latex Rash, Hives and Other (See Comments)     Skin eruptions    . Baclofen Other (See Comments)    Muscle spasms and cramps in shoulder area  . Donepezil Other (See Comments)    Family History: Family History  Problem Relation Age of Onset  . Heart disease Mother   . Hypertension Brother   . Heart disease Brother 32       several MIs  . Heart disease Daughter 65       deceased from MI  . Heart disease Son     Social History:  reports that she quit smoking about 28 years ago. Her smoking use included cigarettes. She has a 8.00 pack-year smoking history. She has quit using smokeless tobacco. She reports that she drank alcohol. She reports that she does not use drugs.  ROS: UROLOGY Frequent Urination?: No Hard to postpone urination?: No Burning/pain with urination?: Yes Get up at night to urinate?: No Leakage of urine?: No Urine stream starts and stops?: No Trouble starting stream?: No Do you have to strain to urinate?: Yes Blood in urine?: Yes Urinary tract infection?: No Sexually transmitted disease?: No Injury to kidneys or bladder?: No Painful intercourse?: No Weak stream?: No Currently pregnant?: No Vaginal bleeding?: No Last menstrual period?: Hysterectomy  Gastrointestinal Nausea?: No Vomiting?: No Indigestion/heartburn?: Yes Diarrhea?: No Constipation?: No  Constitutional Fever: No Night sweats?: No Weight loss?: No Fatigue?: Yes  Skin Skin rash/lesions?: No Itching?: No  Eyes Blurred vision?: No Double vision?: No  Ears/Nose/Throat Sore throat?: No Sinus problems?: No  Hematologic/Lymphatic Swollen glands?: No Easy bruising?: Yes  Cardiovascular Leg swelling?: Yes Chest pain?: No  Respiratory Cough?: No Shortness of breath?: No  Endocrine Excessive thirst?: Yes  Musculoskeletal Back pain?: Yes Joint pain?: Yes  Neurological Headaches?: Yes Dizziness?: No  Psychologic Depression?: No Anxiety?: No  Physical Exam: BP (!) 174/80 (BP Location: Left Arm, Patient  Position: Sitting, Cuff Size: Normal)   Pulse 75   Ht 5\' 5"  (1.651 m)   Wt 163 lb 6.4 oz (74.1 kg)   BMI 27.19 kg/m   Constitutional:  Alert and oriented, No acute distress. HEENT: [place] AT, moist mucus membranes.  Trachea midline, no masses. Cardiovascular: No clubbing, cyanosis, or edema.  RRR Respiratory: Normal respiratory effort, no increased work of breathing.  Lungs clear GI: Abdomen is soft, nontender, nondistended, no abdominal masses GU: No CVA tenderness Lymph: No cervical or inguinal lymphadenopathy.  Bilateral lower extremity edema right greater than left Skin: No rashes, bruises or suspicious lesions. Neurologic: Grossly intact, no focal deficits, moving all 4 extremities. Psychiatric: Normal mood and affect.   Assessment & Plan:   84 year old female with a history of left flank pain and obstruction at the U PJ.  The etiology of her  obstruction has not been identified.  Her urine from the renal pelvis was negative for infection.  I recommended scheduling cystoscopy with stent removal and left ureteral-pyeloscopy to evaluate for any evidence of tumor as a cause of her bleeding.  Cytologies from the renal pelvis will be collected and the possible need for biopsy was discussed if any abnormalities are identified.  The procedure was cussed in detail including potential risks of bleeding, infection/sepsis, ureteral injury and injury to the collecting system.  The low chance of ureteral stricture was discussed.  She indicated all questions were answered and desires to schedule.  A serum creatinine was drawn and Doppler ultrasound for evaluation of her lower extremity edema.  She was given Myrbetriq samples 25 mg daily for her stent symptoms.   Abbie Sons, [place] [address], [place], [place] [address] [phone]

## 2018-07-02 ENCOUNTER — Encounter: Payer: Self-pay | Admitting: Urology

## 2018-07-02 LAB — CREATININE, SERUM
CREATININE: 0.84 mg/dL (ref 0.57–1.00)
GFR calc Af Amer: 75 mL/min/{1.73_m2} (ref >59)
GFR calc non Af Amer: 65 mL/min/{1.73_m2} (ref >59)

## 2018-07-03 ENCOUNTER — Other Ambulatory Visit: Payer: Self-pay | Admitting: Urology

## 2018-07-03 DIAGNOSIS — R600 Localized edema: Secondary | ICD-10-CM

## 2018-07-05 LAB — CULTURE, URINE COMPREHENSIVE

## 2018-07-06 ENCOUNTER — Other Ambulatory Visit: Payer: Self-pay | Admitting: Radiology

## 2018-07-06 DIAGNOSIS — R600 Localized edema: Secondary | ICD-10-CM

## 2018-07-07 ENCOUNTER — Telehealth: Payer: Self-pay

## 2018-07-07 ENCOUNTER — Ambulatory Visit
Admission: RE | Admit: 2018-07-07 | Discharge: 2018-07-07 | Disposition: A | Discharge status: Home / Self Care | Payer: Medicare Other | Source: Ambulatory Visit | Attending: Urology | Admitting: Urology

## 2018-07-07 ENCOUNTER — Other Ambulatory Visit: Payer: Self-pay | Admitting: Radiology

## 2018-07-07 DIAGNOSIS — R600 Localized edema: Secondary | ICD-10-CM | POA: Diagnosis present

## 2018-07-07 DIAGNOSIS — R3129 Other microscopic hematuria: Secondary | ICD-10-CM

## 2018-07-07 DIAGNOSIS — N1330 Unspecified hydronephrosis: Secondary | ICD-10-CM

## 2018-07-07 NOTE — Telephone Encounter (Signed)
-----   Message from Abbie Sons, MD sent at 07/07/2018  4:26 PM EST ----- Lower extremity ultrasound showed no evidence of deep vein thrombosis.

## 2018-07-15 ENCOUNTER — Encounter
Admission: RE | Admit: 2018-07-15 | Discharge: 2018-07-15 | Disposition: A | Discharge status: Home / Self Care | Payer: Medicare Other | Source: Ambulatory Visit | Attending: Urology | Admitting: Urology

## 2018-07-15 ENCOUNTER — Other Ambulatory Visit: Payer: Self-pay

## 2018-07-15 DIAGNOSIS — Z01818 Encounter for other preprocedural examination: Secondary | ICD-10-CM | POA: Diagnosis present

## 2018-07-15 DIAGNOSIS — R3129 Other microscopic hematuria: Secondary | ICD-10-CM | POA: Insufficient documentation

## 2018-07-15 HISTORY — DX: Retained metal fragments, unspecified: Z18.10

## 2018-07-15 HISTORY — DX: Cerebral infarction, unspecified: I63.9

## 2018-07-15 HISTORY — DX: Gastro-esophageal reflux disease without esophagitis: K21.9

## 2018-07-15 HISTORY — DX: Unspecified osteoarthritis, unspecified site: M19.90

## 2018-07-15 HISTORY — DX: Headache: R51

## 2018-07-15 HISTORY — DX: Acute myocardial infarction, unspecified: I21.9

## 2018-07-15 HISTORY — DX: Concussion with loss of consciousness of unspecified duration, initial encounter: S06.0X9A

## 2018-07-15 HISTORY — DX: Headache, unspecified: R51.9

## 2018-07-15 HISTORY — DX: Anxiety disorder, unspecified: F41.9

## 2018-07-15 HISTORY — DX: Malignant (primary) neoplasm, unspecified: C80.1

## 2018-07-15 NOTE — Patient Instructions (Addendum)
Your procedure is scheduled on: Monday, July 20, 2018  Report to [place]     DO NOT STOP ON THE FIRST FLOOR TO REGISTER  To find out your arrival time please call [phone] between 1PM - 3PM on Friday, July 17, 2018   Remember: Instructions that are not followed completely may result in serious medical risk,  up to and including death, or upon the discretion of your surgeon and anesthesiologist your  surgery may need to be rescheduled.     _X__ 1. Do not eat food after midnight the night before your procedure.                 No gum chewing or hard candies.                       ABSOLUTELY NOTHING SOLID IN YOUR MOUTH AFTER MIDNIGHT                  You may drink clear liquids up to 2 hours before you are scheduled to arrive for your surgery-                    DO not drink clear liquids within 2 hours of the start of your surgery.                  Clear Liquids include:  water, apple juice without pulp, clear carbohydrate                 drink such as Clearfast of Gatorade, Black Coffee or Tea (Do not add                 anything to coffee or tea).  __X__2.  On the morning of surgery brush your teeth with toothpaste and water,                   You may rinse your mouth with mouthwash if you wish.                    Do not swallow any toothpaste of mouthwash.     _X__ 3.  No Alcohol for 24 hours before or after surgery.   _X__ 4.  Do Not Smoke or use e-cigarettes For 24 Hours Prior to Your Surgery.                 Do not use any chewable tobacco products for at least 6 hours prior to                 surgery.  ____  5.  Bring all medications with you on the day of surgery if instructed.   ____  6.  Notify your doctor if there is any change in your medical condition      (cold, fever, infections).     Do not wear jewelry, make-up, hairpins, clips or nail polish. Do not wear lotions, powders, or perfumes. You may wear  deodorant. Do not shave 48 hours prior to surgery. Men may shave face and neck. Do not bring valuables to the hospital.    [facility] is not responsible for any belongings or valuables.  Contacts, dentures or bridgework may not be worn into surgery. Leave your suitcase in the car. After surgery it may be brought to your room. For patients admitted to the hospital, discharge time is determined by your treatment team.  Patients discharged the day of surgery will not be allowed to drive home.   Please read over the following fact sheets that you were given:   PREPARING FOR SURGERY    ____ Take these medicines the morning of surgery with A SIP OF WATER:    1.CELEXA  2. VITAMIN B12  3. BYSTOLIC  4.AFRIN NASAL SPRAY  5.  6.  ____ Fleet Enema (as directed)   ____ Use CHG Soap as directed  _X___ Stop ALL ASPIRIN PRODUCTS AS OF TODAY  _X___ Stop Anti-inflammatories AS OF TODAY               THIS INCLUDES IBUPROFEN / MOTRIN / ADVIL / ALEVE                    TYLENOL IS OKAY TO TAKE AT [place]   __X__ Stop supplements until after surgery.               STOP THE POTASSIUM AS OF TODAY  ____ Bring C-Pap to the hospital.    Sunol.  CONTINUE TO TAKE LISINOPRIL / CRESTOR AS USUAL  IF YOU CAN FIND YOUR MEDICAL DIRECTIVES, PLEASE BRING WITH YOU SO THAT      WE MAY MAKE A COPY FOR YOUR CHART  plavix was stopped 07/12/18 per cardiologist

## 2018-07-16 LAB — URINE CULTURE: Culture: <10000 — AB

## 2018-07-19 MED ORDER — CEFAZOLIN SODIUM-DEXTROSE 2-4 GM/100ML-% IV SOLN
2.0000 g | INTRAVENOUS | Status: AC
  Administered 2018-07-20: 2 g via INTRAVENOUS

## 2018-07-20 ENCOUNTER — Other Ambulatory Visit: Payer: Self-pay

## 2018-07-20 ENCOUNTER — Encounter
Admission: RE | Disposition: A | Discharge status: Home / Self Care | Payer: Self-pay | Source: Ambulatory Visit | Attending: Urology

## 2018-07-20 ENCOUNTER — Ambulatory Visit: Payer: Medicare Other | Admitting: Anesthesiology

## 2018-07-20 ENCOUNTER — Telehealth: Payer: Self-pay | Admitting: Urology

## 2018-07-20 ENCOUNTER — Ambulatory Visit
Admission: RE | Admit: 2018-07-20 | Discharge: 2018-07-20 | Disposition: A | Discharge status: Home / Self Care | Payer: Medicare Other | Source: Ambulatory Visit | Attending: Urology | Admitting: Urology

## 2018-07-20 ENCOUNTER — Ambulatory Visit (INDEPENDENT_AMBULATORY_CARE_PROVIDER_SITE_OTHER): Payer: Medicare Other | Admitting: Urology

## 2018-07-20 DIAGNOSIS — Z79899 Other long term (current) drug therapy: Secondary | ICD-10-CM | POA: Insufficient documentation

## 2018-07-20 DIAGNOSIS — Z882 Allergy status to sulfonamides status: Secondary | ICD-10-CM | POA: Diagnosis not present

## 2018-07-20 DIAGNOSIS — Z8673 Personal history of transient ischemic attack (TIA), and cerebral infarction without residual deficits: Secondary | ICD-10-CM | POA: Diagnosis not present

## 2018-07-20 DIAGNOSIS — R3129 Other microscopic hematuria: Secondary | ICD-10-CM

## 2018-07-20 DIAGNOSIS — R319 Hematuria, unspecified: Secondary | ICD-10-CM | POA: Diagnosis not present

## 2018-07-20 DIAGNOSIS — R339 Retention of urine, unspecified: Secondary | ICD-10-CM | POA: Diagnosis not present

## 2018-07-20 DIAGNOSIS — R600 Localized edema: Secondary | ICD-10-CM | POA: Insufficient documentation

## 2018-07-20 DIAGNOSIS — Z888 Allergy status to other drugs, medicaments and biological substances status: Secondary | ICD-10-CM | POA: Diagnosis not present

## 2018-07-20 DIAGNOSIS — Z955 Presence of coronary angioplasty implant and graft: Secondary | ICD-10-CM | POA: Insufficient documentation

## 2018-07-20 DIAGNOSIS — Z96659 Presence of unspecified artificial knee joint: Secondary | ICD-10-CM | POA: Diagnosis not present

## 2018-07-20 DIAGNOSIS — Z7902 Long term (current) use of antithrombotics/antiplatelets: Secondary | ICD-10-CM | POA: Diagnosis not present

## 2018-07-20 DIAGNOSIS — I10 Essential (primary) hypertension: Secondary | ICD-10-CM | POA: Insufficient documentation

## 2018-07-20 DIAGNOSIS — E7800 Pure hypercholesterolemia, unspecified: Secondary | ICD-10-CM | POA: Insufficient documentation

## 2018-07-20 DIAGNOSIS — N1330 Unspecified hydronephrosis: Secondary | ICD-10-CM

## 2018-07-20 DIAGNOSIS — I2510 Atherosclerotic heart disease of native coronary artery without angina pectoris: Secondary | ICD-10-CM | POA: Diagnosis not present

## 2018-07-20 DIAGNOSIS — N3289 Other specified disorders of bladder: Secondary | ICD-10-CM

## 2018-07-20 DIAGNOSIS — Z87448 Personal history of other diseases of urinary system: Secondary | ICD-10-CM

## 2018-07-20 DIAGNOSIS — Z87891 Personal history of nicotine dependence: Secondary | ICD-10-CM | POA: Insufficient documentation

## 2018-07-20 HISTORY — PX: CYSTOSCOPY W/ URETERAL STENT PLACEMENT: SHX1429

## 2018-07-20 HISTORY — PX: URETERAL BIOPSY: SHX6688

## 2018-07-20 SURGERY — CYSTOSCOPY, WITH RETROGRADE PYELOGRAM AND URETERAL STENT INSERTION
Anesthesia: Choice

## 2018-07-20 MED ORDER — OXYCODONE HCL 5 MG PO TABS
ORAL_TABLET | ORAL | Status: AC
  Filled 2018-07-20: qty 1

## 2018-07-20 MED ORDER — ONDANSETRON HCL 4 MG/2ML IJ SOLN
INTRAMUSCULAR | Status: DC | PRN
  Administered 2018-07-20: 4 mg via INTRAVENOUS

## 2018-07-20 MED ORDER — OXYCODONE HCL 5 MG PO TABS
5.0000 mg | ORAL_TABLET | Freq: Once | ORAL | Status: AC
  Administered 2018-07-20: 5 mg via ORAL
  Filled 2018-07-20: qty 1

## 2018-07-20 MED ORDER — LIDOCAINE HCL (CARDIAC) PF 100 MG/5ML IV SOSY
PREFILLED_SYRINGE | INTRAVENOUS | Status: DC | PRN
  Administered 2018-07-20: 80 mg via INTRAVENOUS

## 2018-07-20 MED ORDER — FAMOTIDINE 20 MG PO TABS
20.0000 mg | ORAL_TABLET | Freq: Once | ORAL | Status: AC
  Administered 2018-07-20: 20 mg via ORAL

## 2018-07-20 MED ORDER — ONDANSETRON HCL 4 MG/2ML IJ SOLN
INTRAMUSCULAR | Status: AC
  Filled 2018-07-20: qty 2

## 2018-07-20 MED ORDER — LACTATED RINGERS IV SOLN
INTRAVENOUS | Status: DC
  Administered 2018-07-20: 08:00:00 via INTRAVENOUS

## 2018-07-20 MED ORDER — FENTANYL CITRATE (PF) 100 MCG/2ML IJ SOLN
INTRAMUSCULAR | Status: AC
  Administered 2018-07-20: 25 ug via INTRAVENOUS
  Filled 2018-07-20: qty 2

## 2018-07-20 MED ORDER — ROCURONIUM BROMIDE 100 MG/10ML IV SOLN
INTRAVENOUS | Status: DC | PRN
  Administered 2018-07-20: 30 mg via INTRAVENOUS

## 2018-07-20 MED ORDER — PROPOFOL 10 MG/ML IV BOLUS
INTRAVENOUS | Status: DC | PRN
  Administered 2018-07-20: 170 mg via INTRAVENOUS

## 2018-07-20 MED ORDER — SUGAMMADEX SODIUM 200 MG/2ML IV SOLN
INTRAVENOUS | Status: DC | PRN
  Administered 2018-07-20: 150 mg via INTRAVENOUS

## 2018-07-20 MED ORDER — FAMOTIDINE 20 MG PO TABS
ORAL_TABLET | ORAL | Status: AC
  Administered 2018-07-20: 20 mg via ORAL
  Filled 2018-07-20: qty 1

## 2018-07-20 MED ORDER — DEXAMETHASONE SODIUM PHOSPHATE 10 MG/ML IJ SOLN
INTRAMUSCULAR | Status: DC | PRN
  Administered 2018-07-20: 5 mg via INTRAVENOUS

## 2018-07-20 MED ORDER — CEFAZOLIN SODIUM-DEXTROSE 2-4 GM/100ML-% IV SOLN
INTRAVENOUS | Status: AC
  Filled 2018-07-20: qty 100

## 2018-07-20 MED ORDER — ACETAMINOPHEN 10 MG/ML IV SOLN
INTRAVENOUS | Status: AC
  Filled 2018-07-20: qty 100

## 2018-07-20 MED ORDER — PROPOFOL 10 MG/ML IV BOLUS
INTRAVENOUS | Status: AC
  Filled 2018-07-20: qty 40

## 2018-07-20 MED ORDER — ONDANSETRON HCL 4 MG/2ML IJ SOLN
4.0000 mg | Freq: Once | INTRAMUSCULAR | Status: AC | PRN
  Administered 2018-07-20: 4 mg via INTRAVENOUS

## 2018-07-20 MED ORDER — ACETAMINOPHEN 10 MG/ML IV SOLN
INTRAVENOUS | Status: DC | PRN
  Administered 2018-07-20: 1000 mg via INTRAVENOUS

## 2018-07-20 MED ORDER — FENTANYL CITRATE (PF) 100 MCG/2ML IJ SOLN
INTRAMUSCULAR | Status: AC
  Filled 2018-07-20: qty 2

## 2018-07-20 MED ORDER — FENTANYL CITRATE (PF) 100 MCG/2ML IJ SOLN
INTRAMUSCULAR | Status: DC | PRN
  Administered 2018-07-20: 50 ug via INTRAVENOUS

## 2018-07-20 MED ORDER — PHENYLEPHRINE HCL 10 MG/ML IJ SOLN
INTRAMUSCULAR | Status: DC | PRN
  Administered 2018-07-20: 100 ug via INTRAVENOUS

## 2018-07-20 MED ORDER — FENTANYL CITRATE (PF) 100 MCG/2ML IJ SOLN
25.0000 ug | INTRAMUSCULAR | Status: DC | PRN
  Administered 2018-07-20 (×2): 25 ug via INTRAVENOUS

## 2018-07-20 SURGICAL SUPPLY — 33 items
BAG DRAIN CYSTO-URO LG1000N (MISCELLANEOUS) ×3 IMPLANT
BRUSH SCRUB EZ  4% CHG (MISCELLANEOUS) ×1
BRUSH SCRUB EZ 4% CHG (MISCELLANEOUS) ×2 IMPLANT
CATH URETL 5X70 OPEN END (CATHETERS) ×3 IMPLANT
CONRAY 43 FOR UROLOGY 50M (MISCELLANEOUS) ×3 IMPLANT
COVER WAND RF STERILE (DRAPES) ×3 IMPLANT
DRAPE UTILITY 15X26 TOWEL STRL (DRAPES) ×3 IMPLANT
DRSG TELFA 4X3 1S NADH ST (GAUZE/BANDAGES/DRESSINGS) ×3 IMPLANT
FORCEPS BIOP PIRANHA Y (CUTTING FORCEPS) IMPLANT
GLOVE BIO SURGEON STRL SZ 6.5 (GLOVE) ×2 IMPLANT
GLOVE BIOGEL M 8.0 STRL (GLOVE) ×3 IMPLANT
GLOVE SKINSENSE NS SZ6.5 (GLOVE) ×2
GLOVE SKINSENSE STRL SZ6.5 (GLOVE) IMPLANT
GOWN STANDARD XL  REUSABL (MISCELLANEOUS) ×3 IMPLANT
GOWN STRL REUS W/ TWL LRG LVL3 (GOWN DISPOSABLE) ×4 IMPLANT
GOWN STRL REUS W/TWL LRG LVL3 (GOWN DISPOSABLE) ×2
GUIDEWIRE SUPER STIFF (WIRE) ×1 IMPLANT
INFUSOR MANOMETER BAG 3000ML (MISCELLANEOUS) ×3 IMPLANT
INTRODUCER DILATOR DOUBLE (INTRODUCER) ×1 IMPLANT
KIT TURNOVER CYSTO (KITS) ×3 IMPLANT
NDL SAFETY ECLIPSE 18X1.5 (NEEDLE) ×2 IMPLANT
NEEDLE HYPO 18GX1.5 SHARP (NEEDLE) ×1
PACK CYSTO AR (MISCELLANEOUS) ×3 IMPLANT
SENSORWIRE 0.038 NOT ANGLED (WIRE) ×3
SET CYSTO W/LG BORE CLAMP LF (SET/KITS/TRAYS/PACK) ×3 IMPLANT
SOL .9 NS 3000ML IRR  AL (IV SOLUTION) ×1
SOL .9 NS 3000ML IRR UROMATIC (IV SOLUTION) ×2 IMPLANT
STENT URET 6FRX24 CONTOUR (STENTS) ×3 IMPLANT
STENT URET 6FRX26 CONTOUR (STENTS) ×2 IMPLANT
SURGILUBE 2OZ TUBE FLIPTOP (MISCELLANEOUS) ×3 IMPLANT
WATER STERILE IRR 1000ML POUR (IV SOLUTION) ×3 IMPLANT
WATER STERILE IRR 3000ML UROMA (IV SOLUTION) ×3 IMPLANT
WIRE SENSOR 0.038 NOT ANGLED (WIRE) ×2 IMPLANT

## 2018-07-20 NOTE — Interval H&P Note (Signed)
History and Physical Interval Note:  07/20/2018 9:01 AM  Kelly Ritter  has presented today for surgery, with the diagnosis of Left hydronephrosis, hematuria  The various methods of treatment have been discussed with the patient and family. After consideration of risks, benefits and other options for treatment, the patient has consented to  Procedure(s): CYSTOSCOPY WITH RETROGRADE PYELOGRAM/URETERAL STENT Exchange (Left) Renal Pelvis BIOPSY (Left) as a surgical intervention .  The patient's history has been reviewed, patient examined, no change in status, stable for surgery.  I have reviewed the patient's chart and labs.  Questions were answered to the patient's satisfaction.    RRR CTAB  Hollice Espy

## 2018-07-20 NOTE — Telephone Encounter (Signed)
Spoke to patient. She is coming to office for PVR now.

## 2018-07-20 NOTE — Transfer of Care (Signed)
Immediate Anesthesia Transfer of Care Note  Patient: Kelly Ritter  Procedure(s) Performed: CYSTOSCOPY WITH RETROGRADE PYELOGRAM/URETERAL STENT Exchange (Left ) bladder biopsy  (N/A )  Patient Location: PACU  Anesthesia Type:General  Level of Consciousness: awake, oriented, drowsy and patient cooperative  Airway & Oxygen Therapy: Patient Spontanous Breathing and Patient connected to face mask oxygen  Post-op Assessment: Report given to RN, Post -op Vital signs reviewed and stable and Patient moving all extremities  Post vital signs: Reviewed and stable  Last Vitals:  Vitals Value Taken Time  BP 166/76 07/20/2018 10:35 AM  Temp 36.3 C 07/20/2018 10:35 AM  Pulse 70 07/20/2018 10:39 AM  Resp 13 07/20/2018 10:39 AM  SpO2 100 % 07/20/2018 10:39 AM  Vitals shown include unvalidated device data.  Last Pain:  Vitals:   07/20/18 1035  TempSrc:   PainSc: 0-No pain         Complications: No apparent anesthesia complications

## 2018-07-20 NOTE — Progress Notes (Signed)
Cath Change/ Replacement  Patient was cleaned and prepped in a sterile fashion with betadine and 2% lidocaine jelly was instilled into the urethra. A 16 FR foley cath was replaced into the bladder no complications were noted Urine return was noted 687ml and urine was Bloody in color. The balloon was filled with 81ml of sterile water. A leg bag was attached for drainage.  A night bag was also given to the patient and patient was given instruction on how to change from one bag to another. Patient was given proper instruction on catheter care.    Preformed by: Elberta Leatherwood, CMA

## 2018-07-20 NOTE — OR Nursing (Signed)
Per Dr Erlene Quan in [place] room, tell daughter to go to [employer] office to pick up myrbetriq.  Per Dr. Erlene Quan, she's not going to prescribe any other pain med.

## 2018-07-20 NOTE — Anesthesia Procedure Notes (Signed)
Procedure Name: Intubation Date/Time: 07/20/2018 9:42 AM Performed by: Lowry Bowl, CRNA Pre-anesthesia Checklist: Patient identified, Emergency Drugs available, Suction available, Patient being monitored and Timeout performed Patient Re-evaluated:Patient Re-evaluated prior to induction Oxygen Delivery Method: Circle system utilized Preoxygenation: Pre-oxygenation with 100% oxygen Induction Type: IV induction Ventilation: Mask ventilation without difficulty Laryngoscope Size: Mac and 3 Grade View: Grade II Tube type: Oral Tube size: 7.0 mm Number of attempts: 1 Airway Equipment and Method: Stylet Placement Confirmation: ETT inserted through vocal cords under direct vision,  positive ETCO2 and breath sounds checked- equal and bilateral Secured at: 21 cm Tube secured with: Tape Dental Injury: Teeth and Oropharynx as per pre-operative assessment  Comments: Grade II b with cricoid pressure

## 2018-07-20 NOTE — OR Nursing (Signed)
Discharge pending MD availability to prescribe pain med (currently in OR)  Pt advises she does not have any pain meds at home.

## 2018-07-20 NOTE — Telephone Encounter (Signed)
-----   Message from Hollice Espy, MD sent at 07/20/2018 10:55 AM EST ----- Regarding: RUS in 1 month This is a postop patient.  I like her to get a renal ultrasound in 4 to 6 weeks and then see me.  Hollice Espy, MD

## 2018-07-20 NOTE — Anesthesia Postprocedure Evaluation (Signed)
Anesthesia Post Note  Patient: Kelly Ritter  Procedure(s) Performed: CYSTOSCOPY WITH RETROGRADE PYELOGRAM/URETERAL STENT Exchange (Left ) bladder biopsy  (N/A )  Patient location during evaluation: PACU Anesthesia Type: General Level of consciousness: awake and alert and oriented Pain management: pain level controlled Vital Signs Assessment: post-procedure vital signs reviewed and stable Respiratory status: spontaneous breathing Cardiovascular status: blood pressure returned to baseline Anesthetic complications: no     Last Vitals:  Vitals:   07/20/18 1105 07/20/18 1116  BP: (!) 162/72 (!) 162/61  Pulse: 71 78  Resp: 12 14  Temp: (!) 36.2 C 36.6 C  SpO2: 95% 97%    Last Pain:  Vitals:   07/20/18 1154  TempSrc:   PainSc: 4                  Schon Zeiders

## 2018-07-20 NOTE — Telephone Encounter (Signed)
Pt's daughter, Nira Conn, called back again stating mom is in a lot of pain and begging to go to hospital.  She has been unable to void since leaving hospital around lunch.  Please call daughter at [phone]

## 2018-07-20 NOTE — Anesthesia Preprocedure Evaluation (Addendum)
Anesthesia Evaluation  Patient identified by MRN, date of birth, ID band Patient awake    Reviewed: Allergy & Precautions, H&P , NPO status , Patient's Chart, lab work & pertinent test results  History of Anesthesia Complications Negative for: history of anesthetic complications  Airway Mallampati: III  TM Distance: <3 FB Neck ROM: full    Dental  (+) Chipped, Poor Dentition   Pulmonary neg shortness of breath, former smoker,           Cardiovascular Exercise Tolerance: Good hypertension, + CAD, + Past MI, + Cardiac Stents and + Peripheral Vascular Disease       Neuro/Psych  Headaches, PSYCHIATRIC DISORDERS  Neuromuscular disease    GI/Hepatic Neg liver ROS, hiatal hernia, GERD  Medicated and Controlled,  Endo/Other  negative endocrine ROS  Renal/GU      Musculoskeletal  (+) Arthritis ,   Abdominal   Peds  Hematology negative hematology ROS (+)   Anesthesia Other Findings Past Medical History: No date: CAD (coronary artery disease)     Comment:  s/p PCI and stent placement of circumflex and LAD and               RCA.  restenosis of RCA 2014 with drug eluting stent No date: Collagen vascular disease ([place]) No date: Hiatal hernia No date: Hypercholesterolemia No date: Hypertension No date: Peripheral vascular disease [facility])  Past Surgical History: 1979: ABDOMINAL HYSTERECTOMY 1956: APPENDECTOMY 09/25/2015: CARDIAC CATHETERIZATION; N/A     Comment:  Procedure: Left Heart Cath and Coronary Angiography;                Surgeon: Teodoro Spray, MD;  Location: [facility]               [facility];  Service: Cardiovascular;  Laterality: N/A; 2014: CAROTID STENT; Left 2010: EYE SURGERY     Comment:  cataracts No date: JOINT REPLACEMENT     Comment:  knee replacement 12/19/2014: PERIPHERAL VASCULAR CATHETERIZATION; Left     Comment:  Procedure: Carotid PTA/Stent Intervention;  Surgeon:               Algernon Huxley, MD;   Location: [facility];                Service: Cardiovascular;  Laterality: Left;  BMI    Body Mass Index:  26.49 kg/m      Reproductive/Obstetrics negative OB ROS                             Anesthesia Physical  Anesthesia Plan  ASA: IV  Anesthesia Plan:    Post-op Pain Management:    Induction: Intravenous  PONV Risk Score and Plan: Ondansetron, Dexamethasone, Midazolam and Treatment may vary due to age or medical condition  Airway Management Planned: Oral ETT  Additional Equipment:   Intra-op Plan:   Post-operative Plan: Extubation in OR  Informed Consent: I have reviewed the patients History and Physical, chart, labs and discussed the procedure including the risks, benefits and alternatives for the proposed anesthesia with the patient or authorized representative who has indicated his/her understanding and acceptance.   Dental Advisory Given  Plan Discussed with: Anesthesiologist, CRNA and Surgeon  Anesthesia Plan Comments: (Plan to suspend DNR for procedure.  Patient and family voiced understanding.  Patient consented for risks of anesthesia including but not limited to:  - adverse reactions to medications - damage to teeth, lips or other  oral mucosa - sore throat or hoarseness - Damage to heart, brain, lungs or loss of life  Patient voiced understanding.)       Anesthesia Quick Evaluation

## 2018-07-20 NOTE — Op Note (Signed)
Date of procedure: 07/20/18  Preoperative diagnosis:  1. History of left hydronephrosis/pyelonephritis  Postoperative diagnosis:  1. Same as above 2. Negative left ureteroscopy 3. Bladder erythema  Procedure: 1. Cystoscopy 2. Left retrograde pyelogram 3. Left diagnostic ureteroscopy 4. Left stent exchange 5. Bladder biopsy  Surgeon: Hollice Espy, MD  Anesthesia: General  Complications: None  Intraoperative findings: Nonspecific bladder erythema primarily on the posterior wall.  Negative left ureteroscopy without hydroureteronephrosis or masses.  EBL: Minimal  Specimens: Bladder biopsy  Drains: 6 x 24 French double-J ureteral stent on the left, string left in place  Indication: Kelly Ritter is a 84 y.o. patient with recent admission found to have incidental left hydronephrosis possibly secondary to left UPJ obstruction and presumed pyelonephritis.  She underwent ureteral stent placement was advised to return today for diagnostic left ureteroscopy.  After reviewing the management options for treatment, she elected to proceed with the above surgical procedure(s). We have discussed the potential benefits and risks of the procedure, side effects of the proposed treatment, the likelihood of the patient achieving the goals of the procedure, and any potential problems that might occur during the procedure or recuperation. Informed consent has been obtained.  Description of procedure:  The patient was taken to the operating room and general anesthesia was induced.  The patient was placed in the dorsal lithotomy position, prepped and draped in the usual sterile fashion, and preoperative antibiotics were administered. A preoperative time-out was performed.   A 21 French scope was advanced per urethra into the bladder.  Attention was turned to the left ureter orifice which a ureteral stent was seen emanating.  Notably, there is nonspecific patchy erythema in the posterior wall,  presumably from cystitis related to irritation from the stent or lesions identified.  Attention was then turned to the left ureteral orifice and stent graspers were used to grasp the distal coil of the stent to bring it to level the urethral meatus.  The stent was then cannulated using a sensor wire up to the level of the bladder.  Wire was left in place and the stent was removed.  A dual-lumen access catheter was used to introduce a second Super Stiff wire as a working wire.  A single channel 7 French flexible ureteroscope was then advanced over the wire up to the level of the kidney without difficulty.  This was alongside of the safety wire.  Formal pyeloscopy was then performed.  There are no lesions tumors or any other abnormalities within the renal pelvis.  Each and every calyx was then directly visualized as well.  There was 1 stenotic infundibula in the upper pole which was fully dilated using the beak of the scope.  There were no lesions, stones, tumors, or any suspicious findings in the upper tract.  Urine was aspirated for urine cytology as a precaution.  A final retrograde pyelogram was performed which showed no hydroureteronephrosis or filling defects.  This also created a roadmap to ensure that each every calyx was directly visualized.  The scope was then backed down the length of the ureter with special attention at the level of the UPJ which was widely patent and no abnormalities appreciated.  Again, there is no hydroureteronephrosis.  Concern for UPJ obstruction appreciated.  A 6 x 24 French double-J ureteral stent was then advanced over the wire up to level of the kidney.  The wires partially removed and a coil was noted within the upper pole calyx.  The wire was then fully  withdrawn a full coils noted the bladder.  The stent string was left affixed to the patient's distal coil of the string.  Next, cold cup biopsies were used to take 3 representative samples of the posterior wall bladder  erythema.  Bugbee electrocautery was then used to achieve hemostasis.  The bladder was then drained and the scope was removed.  The stent string was affixed to the patient's left inner thigh using Mastisol and Tegaderm.  Plan: Patient will remove her own stent in 3 days.  We will have her follow-up in 4 to 6 weeks with renal ultrasound prior.  I will call her with her pathology results as they anticipate they will be benign.  She should continue Myrbetriq as needed for bladder spasms.  Hollice Espy, M.D.

## 2018-07-20 NOTE — Telephone Encounter (Signed)
Pt called office stating that she just had surgery this morning, pt states she is having bladder spasms and hasn't been able to urinate since she's been home. Placed pt on hold to find someone to triage the call, no one available, went to pick up the call, pt was no longer on hold. Please advise pt soon. Thank you.

## 2018-07-20 NOTE — Telephone Encounter (Signed)
App made with Kelly Ritter in post op Message sent to scheduling about RUS  Kelly Ritter

## 2018-07-20 NOTE — Anesthesia Post-op Follow-up Note (Signed)
Anesthesia QCDR form completed.        

## 2018-07-20 NOTE — Discharge Instructions (Addendum)
You have a ureteral stent in place.  This is a tube that extends from your kidney to your bladder.  This may cause urinary bleeding, burning with urination, and urinary frequency.  Please call our office or present to the ED if you develop fevers >101 or pain which is not able to be controlled with oral pain medications.  You may be given either Flomax and/ or ditropan to help with bladder spasms and stent pain in addition to pain medications.    Your stent is on a string.  This is attached to your left inner thigh.  On Thursday, you may untape the stent and pulled gently until it is has been removed.  If you have any issues, please contact our office.  [facility] [address], [place], [place] [address] [phone]   AMBULATORY SURGERY  DISCHARGE INSTRUCTIONS   1) The drugs that you were given will stay in your system until tomorrow so for the next 24 hours you should not:  A) Drive an automobile B) Make any legal decisions C) Drink any alcoholic beverage   2) You may resume regular meals tomorrow.  Today it is better to start with liquids and gradually work up to solid foods.  You may eat anything you prefer, but it is better to start with liquids, then soup and crackers, and gradually work up to solid foods.   3) Please notify your doctor immediately if you have any unusual bleeding, trouble breathing, redness and pain at the surgery site, drainage, fever, or pain not relieved by medication.    4) Additional Instructions:        Please contact your physician with any problems or Same Day Surgery at [phone], Monday through Friday 6 am to 4 pm, or [place] at [facility] number at [phone].

## 2018-07-21 ENCOUNTER — Encounter: Payer: Self-pay | Admitting: Urology

## 2018-07-21 ENCOUNTER — Ambulatory Visit (INDEPENDENT_AMBULATORY_CARE_PROVIDER_SITE_OTHER): Payer: Medicare Other | Admitting: Urology

## 2018-07-21 VITALS — BP 123/66 | HR 78 | Ht 65.0 in | Wt 158.0 lb

## 2018-07-21 DIAGNOSIS — R310 Gross hematuria: Secondary | ICD-10-CM

## 2018-07-21 DIAGNOSIS — R339 Retention of urine, unspecified: Secondary | ICD-10-CM | POA: Diagnosis not present

## 2018-07-21 LAB — CYTOLOGY - NON PAP

## 2018-07-21 NOTE — Progress Notes (Signed)
Bladder Irrigation  Due to post surgery urinay retention patient is present today for a bladder irrigation. Patient was cleaned and prepped in a sterile fashion. 200 ml of saline/sterile water was instilled and irrigated into the bladder with a 61ml [place] syringe through the catheter in place.  After irrigation urine flow was noted no complications were noted catheter was removed. Patient is to return to clinic this afternoon if she is unable to urinate.  Preformed by: Kelly Ritter, CMA  Additional notes/ Follow up: As scheduled.  Catheter Removal  Patient is present today for a catheter removal.  37ml of water was drained from the balloon. A 16FR foley cath was removed from the bladder no complications were noted . Patient tolerated well.  Preformed by: Kelly Ritter, CMA

## 2018-07-21 NOTE — Progress Notes (Signed)
07/21/18  Patient had a Foley catheter placed yesterday with fairly significant urine return and small clots.  She called this morning complaining of small clots and dark urine.  She presents today for assessment.  She did have over a liter of output overnight.  She is tolerating the catheter well.  Blood pressure 123/66, pulse 78, height 5\' 5"  (1.651 m), weight 158 lb (71.7 kg). On exam today, her urine is maroon-colored with a few scant clot. Well-appearing, accompanied by daughter, NED  Assessment / Plan:  1.  Clot retention-Foley catheter irrigated today and a fill and pull was performed.  She return today at 3 PM if she has difficulty voiding for replacement of catheter as needed versus bladder scan.  2.  Gross hematuria- does not appear to be significant or active bleeding.  Maroon-colored indicative of old blood.  Bladder management as above.  3.  Left hydronephrosis-status post left ureteroscopy.  Ureteroscopy negative.  Stent on string.  Okay to remove stent on Thursday as per previous recommendations.  Follow-up as scheduled with renal ultrasound prior in 4 to 6 weeks.  Hollice Espy, MD   I spent 10 min with this patient of which greater than 50% was spent in counseling and coordination of care with the patient.

## 2018-07-21 NOTE — Progress Notes (Signed)
This patient called and postoperatively having difficulty voiding.  We arrange for her to be seen in the clinic for Foley catheter placement.  See MA note as below.  Hollice Espy, MD

## 2018-07-22 LAB — SURGICAL PATHOLOGY

## 2018-07-23 ENCOUNTER — Telehealth: Payer: Self-pay | Admitting: Radiology

## 2018-07-23 DIAGNOSIS — E854 Organ-limited amyloidosis: Secondary | ICD-10-CM

## 2018-07-23 NOTE — Telephone Encounter (Signed)
Patient returned Dr Cherrie Gauze call. Asks for a return call to [phone].

## 2018-07-24 NOTE — Telephone Encounter (Signed)
Call patient on telephone to discuss bladder biopsy results.  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  I discussed this case with several colleagues in [place] and physicians at the [facility] center who recommended referral to a specialist in amyloid at [facility], Dr. Effie Berkshire.  She agreeable with this referral.  She will follow-up with me in January as already scheduled.  All questions answered today.  Hollice Espy, MD

## 2018-07-25 ENCOUNTER — Emergency Department: Payer: Medicare Other

## 2018-07-25 ENCOUNTER — Other Ambulatory Visit: Payer: Self-pay

## 2018-07-25 ENCOUNTER — Observation Stay
Admission: EM | Admit: 2018-07-25 | Discharge: 2018-07-27 | Disposition: A | Discharge status: Home / Self Care | Payer: Medicare Other | Attending: Internal Medicine | Admitting: Internal Medicine

## 2018-07-25 DIAGNOSIS — E785 Hyperlipidemia, unspecified: Secondary | ICD-10-CM | POA: Diagnosis not present

## 2018-07-25 DIAGNOSIS — I722 Aneurysm of renal artery: Secondary | ICD-10-CM | POA: Diagnosis not present

## 2018-07-25 DIAGNOSIS — Z87891 Personal history of nicotine dependence: Secondary | ICD-10-CM | POA: Diagnosis not present

## 2018-07-25 DIAGNOSIS — Z79899 Other long term (current) drug therapy: Secondary | ICD-10-CM | POA: Insufficient documentation

## 2018-07-25 DIAGNOSIS — R109 Unspecified abdominal pain: Secondary | ICD-10-CM | POA: Diagnosis not present

## 2018-07-25 DIAGNOSIS — Z95828 Presence of other vascular implants and grafts: Secondary | ICD-10-CM | POA: Insufficient documentation

## 2018-07-25 DIAGNOSIS — K219 Gastro-esophageal reflux disease without esophagitis: Secondary | ICD-10-CM | POA: Insufficient documentation

## 2018-07-25 DIAGNOSIS — E8589 Other amyloidosis: Secondary | ICD-10-CM | POA: Insufficient documentation

## 2018-07-25 DIAGNOSIS — K529 Noninfective gastroenteritis and colitis, unspecified: Principal | ICD-10-CM

## 2018-07-25 DIAGNOSIS — I2510 Atherosclerotic heart disease of native coronary artery without angina pectoris: Secondary | ICD-10-CM | POA: Diagnosis not present

## 2018-07-25 DIAGNOSIS — I700 Atherosclerosis of aorta: Secondary | ICD-10-CM | POA: Diagnosis not present

## 2018-07-25 DIAGNOSIS — I10 Essential (primary) hypertension: Secondary | ICD-10-CM | POA: Diagnosis not present

## 2018-07-25 DIAGNOSIS — M4316 Spondylolisthesis, lumbar region: Secondary | ICD-10-CM | POA: Diagnosis not present

## 2018-07-25 DIAGNOSIS — K5900 Constipation, unspecified: Secondary | ICD-10-CM | POA: Diagnosis present

## 2018-07-25 DIAGNOSIS — F411 Generalized anxiety disorder: Secondary | ICD-10-CM | POA: Diagnosis present

## 2018-07-25 DIAGNOSIS — Z66 Do not resuscitate: Secondary | ICD-10-CM | POA: Insufficient documentation

## 2018-07-25 DIAGNOSIS — Z85828 Personal history of other malignant neoplasm of skin: Secondary | ICD-10-CM | POA: Diagnosis not present

## 2018-07-25 DIAGNOSIS — I739 Peripheral vascular disease, unspecified: Secondary | ICD-10-CM | POA: Diagnosis not present

## 2018-07-25 DIAGNOSIS — M1990 Unspecified osteoarthritis, unspecified site: Secondary | ICD-10-CM | POA: Insufficient documentation

## 2018-07-25 DIAGNOSIS — I25118 Atherosclerotic heart disease of native coronary artery with other forms of angina pectoris: Secondary | ICD-10-CM | POA: Diagnosis present

## 2018-07-25 DIAGNOSIS — Z7902 Long term (current) use of antithrombotics/antiplatelets: Secondary | ICD-10-CM | POA: Insufficient documentation

## 2018-07-25 DIAGNOSIS — E782 Mixed hyperlipidemia: Secondary | ICD-10-CM | POA: Diagnosis present

## 2018-07-25 DIAGNOSIS — Z8673 Personal history of transient ischemic attack (TIA), and cerebral infarction without residual deficits: Secondary | ICD-10-CM | POA: Insufficient documentation

## 2018-07-25 DIAGNOSIS — Z23 Encounter for immunization: Secondary | ICD-10-CM | POA: Diagnosis not present

## 2018-07-25 DIAGNOSIS — K5730 Diverticulosis of large intestine without perforation or abscess without bleeding: Secondary | ICD-10-CM | POA: Diagnosis not present

## 2018-07-25 DIAGNOSIS — E7800 Pure hypercholesterolemia, unspecified: Secondary | ICD-10-CM | POA: Insufficient documentation

## 2018-07-25 DIAGNOSIS — I252 Old myocardial infarction: Secondary | ICD-10-CM | POA: Diagnosis not present

## 2018-07-25 DIAGNOSIS — Z955 Presence of coronary angioplasty implant and graft: Secondary | ICD-10-CM | POA: Insufficient documentation

## 2018-07-25 DIAGNOSIS — Z791 Long term (current) use of non-steroidal anti-inflammatories (NSAID): Secondary | ICD-10-CM | POA: Insufficient documentation

## 2018-07-25 LAB — COMPREHENSIVE METABOLIC PANEL
ALT: 15 U/L (ref 0–44)
AST: 20 U/L (ref 15–41)
Albumin: 4.1 g/dL (ref 3.5–5.0)
Alkaline Phosphatase: 84 U/L (ref 38–126)
Anion gap: 13 (ref 5–15)
BUN: 16 mg/dL (ref 8–23)
CHLORIDE: 103 mmol/L (ref 98–111)
CO2: 21 mmol/L — ABNORMAL LOW (ref 22–32)
Calcium: 9.7 mg/dL (ref 8.9–10.3)
Creatinine, Ser: 0.99 mg/dL (ref 0.44–1.00)
GFR calc Af Amer: >60 mL/min (ref >60)
GFR calc non Af Amer: 53 mL/min — ABNORMAL LOW (ref >60)
Glucose, Bld: 206 mg/dL — ABNORMAL HIGH (ref 70–99)
POTASSIUM: 3.7 mmol/L (ref 3.5–5.1)
Sodium: 137 mmol/L (ref 135–145)
Total Bilirubin: 0.9 mg/dL (ref 0.3–1.2)
Total Protein: 7.2 g/dL (ref 6.5–8.1)

## 2018-07-25 LAB — CBC WITH DIFFERENTIAL/PLATELET
Abs Immature Granulocytes: 0.08 10*3/uL — ABNORMAL HIGH (ref 0.00–0.07)
Basophils Absolute: 0.1 10*3/uL (ref 0.0–0.1)
Basophils Relative: 0 %
Eosinophils Absolute: 0 10*3/uL (ref 0.0–0.5)
Eosinophils Relative: 0 %
HCT: 43.8 % (ref 36.0–46.0)
Hemoglobin: 14.6 g/dL (ref 12.0–15.0)
IMMATURE GRANULOCYTES: 1 %
LYMPHS PCT: 9 %
Lymphs Abs: 1.3 10*3/uL (ref 0.7–4.0)
MCH: 30.9 pg (ref 26.0–34.0)
MCHC: 33.3 g/dL (ref 30.0–36.0)
MCV: 92.8 fL (ref 80.0–100.0)
Monocytes Absolute: 0.5 10*3/uL (ref 0.1–1.0)
Monocytes Relative: 4 %
Neutro Abs: 12.6 10*3/uL — ABNORMAL HIGH (ref 1.7–7.7)
Neutrophils Relative %: 86 %
Platelets: 246 10*3/uL (ref 150–400)
RBC: 4.72 MIL/uL (ref 3.87–5.11)
RDW: 12.5 % (ref 11.5–15.5)
WBC: 14.5 10*3/uL — ABNORMAL HIGH (ref 4.0–10.5)
nRBC: 0 % (ref 0.0–0.2)

## 2018-07-25 LAB — LIPASE, BLOOD: Lipase: 29 U/L (ref 11–51)

## 2018-07-25 MED ORDER — ONDANSETRON HCL 4 MG/2ML IJ SOLN
4.0000 mg | Freq: Once | INTRAMUSCULAR | Status: AC
  Administered 2018-07-25: 4 mg via INTRAVENOUS
  Filled 2018-07-25: qty 2

## 2018-07-25 MED ORDER — SODIUM CHLORIDE 0.9 % IV BOLUS
1000.0000 mL | Freq: Once | INTRAVENOUS | Status: AC
  Administered 2018-07-25: 1000 mL via INTRAVENOUS

## 2018-07-25 MED ORDER — IOPAMIDOL (ISOVUE-300) INJECTION 61%
100.0000 mL | Freq: Once | INTRAVENOUS | Status: AC | PRN
  Administered 2018-07-25: 100 mL via INTRAVENOUS

## 2018-07-25 MED ORDER — MORPHINE SULFATE (PF) 2 MG/ML IV SOLN
2.0000 mg | Freq: Once | INTRAVENOUS | Status: AC
  Administered 2018-07-25: 2 mg via INTRAVENOUS
  Filled 2018-07-25: qty 1

## 2018-07-25 NOTE — ED Triage Notes (Signed)
Pt arrived via [facility] from home with c/o Abdominal Pain, N/V, And post-op complication. EMS states pt had a kidney biopsy 5 days ago and has been constipated since the surgery. EMS states pt has had nasuea and vomited for the last couple of days.
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
## 2018-07-25 NOTE — ED Notes (Signed)
Pt daughter at bedside

## 2018-07-25 NOTE — ED Notes (Signed)
Patient transported to CT 

## 2018-07-26 DIAGNOSIS — E782 Mixed hyperlipidemia: Secondary | ICD-10-CM | POA: Diagnosis present

## 2018-07-26 DIAGNOSIS — R109 Unspecified abdominal pain: Secondary | ICD-10-CM | POA: Diagnosis present

## 2018-07-26 DIAGNOSIS — E785 Hyperlipidemia, unspecified: Secondary | ICD-10-CM

## 2018-07-26 DIAGNOSIS — R933 Abnormal findings on diagnostic imaging of other parts of digestive tract: Secondary | ICD-10-CM

## 2018-07-26 DIAGNOSIS — K559 Vascular disorder of intestine, unspecified: Secondary | ICD-10-CM

## 2018-07-26 DIAGNOSIS — K219 Gastro-esophageal reflux disease without esophagitis: Secondary | ICD-10-CM | POA: Diagnosis present

## 2018-07-26 LAB — URINALYSIS, COMPLETE (UACMP) WITH MICROSCOPIC
Bacteria, UA: NONE SEEN
Bilirubin Urine: NEGATIVE
Glucose, UA: NEGATIVE mg/dL
Ketones, ur: 5 mg/dL — AB
Leukocytes, UA: NEGATIVE
Nitrite: NEGATIVE
Protein, ur: NEGATIVE mg/dL
Specific Gravity, Urine: >1.046 — ABNORMAL HIGH (ref 1.005–1.030)
pH: 5 (ref 5.0–8.0)

## 2018-07-26 LAB — CBC
HEMATOCRIT: 40.8 % (ref 36.0–46.0)
Hemoglobin: 13.5 g/dL (ref 12.0–15.0)
MCH: 30.8 pg (ref 26.0–34.0)
MCHC: 33.1 g/dL (ref 30.0–36.0)
MCV: 93.2 fL (ref 80.0–100.0)
Platelets: 234 10*3/uL (ref 150–400)
RBC: 4.38 MIL/uL (ref 3.87–5.11)
RDW: 12.6 % (ref 11.5–15.5)
WBC: 13.1 10*3/uL — ABNORMAL HIGH (ref 4.0–10.5)
nRBC: 0 % (ref 0.0–0.2)

## 2018-07-26 LAB — BASIC METABOLIC PANEL
Anion gap: 9 (ref 5–15)
BUN: 17 mg/dL (ref 8–23)
CO2: 23 mmol/L (ref 22–32)
Calcium: 9 mg/dL (ref 8.9–10.3)
Chloride: 106 mmol/L (ref 98–111)
Creatinine, Ser: 0.88 mg/dL (ref 0.44–1.00)
GFR calc Af Amer: >60 mL/min (ref >60)
GFR calc non Af Amer: >60 mL/min (ref >60)
Glucose, Bld: 156 mg/dL — ABNORMAL HIGH (ref 70–99)
POTASSIUM: 4.1 mmol/L (ref 3.5–5.1)
Sodium: 138 mmol/L (ref 135–145)

## 2018-07-26 MED ORDER — LISINOPRIL 20 MG PO TABS
20.0000 mg | ORAL_TABLET | Freq: Every evening | ORAL | Status: DC
  Administered 2018-07-26: 20 mg via ORAL
  Filled 2018-07-26: qty 1

## 2018-07-26 MED ORDER — INFLUENZA VAC SPLIT HIGH-DOSE 0.5 ML IM SUSY
0.5000 mL | PREFILLED_SYRINGE | INTRAMUSCULAR | Status: AC
  Administered 2018-07-27: 0.5 mL via INTRAMUSCULAR
  Filled 2018-07-26: qty 0.5

## 2018-07-26 MED ORDER — ROSUVASTATIN CALCIUM 10 MG PO TABS
20.0000 mg | ORAL_TABLET | Freq: Every day | ORAL | Status: DC
  Administered 2018-07-26: 20 mg via ORAL
  Filled 2018-07-26: qty 2

## 2018-07-26 MED ORDER — PEG 3350-KCL-NA BICARB-NACL 420 G PO SOLR
4000.0000 mL | Freq: Once | ORAL | Status: AC
  Administered 2018-07-26: 4000 mL via ORAL
  Filled 2018-07-26: qty 4000

## 2018-07-26 MED ORDER — SODIUM CHLORIDE 0.9 % IV SOLN
INTRAVENOUS | Status: AC
  Administered 2018-07-26: 01:00:00 via INTRAVENOUS

## 2018-07-26 MED ORDER — ACETAMINOPHEN 325 MG PO TABS: 650.0000 mg | ORAL_TABLET | Freq: Four times a day (QID) | ORAL | Status: DC | PRN

## 2018-07-26 MED ORDER — ACETAMINOPHEN 650 MG RE SUPP: 650.0000 mg | Freq: Four times a day (QID) | RECTAL | Status: DC | PRN

## 2018-07-26 MED ORDER — ENOXAPARIN SODIUM 40 MG/0.4ML ~~LOC~~ SOLN
40.0000 mg | SUBCUTANEOUS | Status: DC
  Filled 2018-07-26: qty 0.4

## 2018-07-26 MED ORDER — ONDANSETRON HCL 4 MG/2ML IJ SOLN
4.0000 mg | Freq: Four times a day (QID) | INTRAMUSCULAR | Status: DC | PRN
  Administered 2018-07-26: 4 mg via INTRAVENOUS
  Filled 2018-07-26: qty 2

## 2018-07-26 MED ORDER — PANTOPRAZOLE SODIUM 40 MG PO TBEC
40.0000 mg | DELAYED_RELEASE_TABLET | Freq: Every day | ORAL | Status: DC
  Administered 2018-07-26 – 2018-07-27 (×2): 40 mg via ORAL
  Filled 2018-07-26 (×2): qty 1

## 2018-07-26 MED ORDER — CITALOPRAM HYDROBROMIDE 20 MG PO TABS
10.0000 mg | ORAL_TABLET | Freq: Every day | ORAL | Status: DC
  Administered 2018-07-26 – 2018-07-27 (×2): 10 mg via ORAL
  Filled 2018-07-26 (×2): qty 1

## 2018-07-26 MED ORDER — MORPHINE SULFATE (PF) 2 MG/ML IV SOLN
1.0000 mg | INTRAVENOUS | Status: DC | PRN
  Administered 2018-07-26: 1 mg via INTRAVENOUS
  Filled 2018-07-26: qty 1

## 2018-07-26 MED ORDER — ONDANSETRON HCL 4 MG PO TABS: 4.0000 mg | ORAL_TABLET | Freq: Four times a day (QID) | ORAL | Status: DC | PRN

## 2018-07-26 MED ORDER — NEBIVOLOL HCL 5 MG PO TABS
5.0000 mg | ORAL_TABLET | Freq: Every day | ORAL | Status: DC
  Administered 2018-07-26 – 2018-07-27 (×2): 5 mg via ORAL
  Filled 2018-07-26 (×2): qty 1

## 2018-07-26 MED ORDER — CLOPIDOGREL BISULFATE 75 MG PO TABS
75.0000 mg | ORAL_TABLET | Freq: Every day | ORAL | Status: DC
  Administered 2018-07-26: 75 mg via ORAL
  Filled 2018-07-26: qty 1
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
## 2018-07-26 NOTE — Progress Notes (Addendum)
[place] at [facility]                                                                                                                                                                                  Patient Demographics   Kelly Ritter, is a 84 y.o. female, DOB - 28-Mar-1934, GXQ:[id number]  Admit date - 07/25/2018   Admitting Physician Lance Coon, MD  Outpatient Primary MD for the patient is Glean Hess, MD   LOS - 0  Subjective: Patient admitted with abdominal pain , patient states that after having a good bowel movement her abdominal pain mostly resolved.  Has some dull ache in the left lower quadrant.    Review of Systems:   CONSTITUTIONAL: No documented fever. No fatigue, weakness. No weight gain, no weight loss.  EYES: No blurry or double vision.  ENT: No tinnitus. No postnasal drip. No redness of the oropharynx.  RESPIRATORY: No cough, no wheeze, no hemoptysis. No dyspnea.  CARDIOVASCULAR: No chest pain. No orthopnea. No palpitations. No syncope.  GASTROINTESTINAL: No nausea, no vomiting or diarrhea.  left lower quadrant abdominal pain. No melena or hematochezia.  GENITOURINARY: No dysuria or hematuria.  ENDOCRINE: No polyuria or nocturia. No heat or cold intolerance.  HEMATOLOGY: No anemia. No bruising. No bleeding.  INTEGUMENTARY: No rashes. No lesions.  MUSCULOSKELETAL: No arthritis. No swelling. No gout.  NEUROLOGIC: No numbness, tingling, or ataxia. No seizure-type activity.  PSYCHIATRIC: No anxiety. No insomnia. No ADD.    Vitals:   Vitals:   07/26/18 0030 07/26/18 0118 07/26/18 0120 07/26/18 0121  BP: (!) 169/75 (!) 193/79 (!) 179/69   Pulse: 76 91 89   Resp: 14 19    Temp:  98.4 F (36.9 C)    TempSrc:  Oral    SpO2: 96% 95%    Weight:    71.8 kg  Height:    5\' 5"  (1.651 m)    Wt Readings from Last 3 Encounters:  07/26/18 71.8 kg  07/21/18 71.7 kg  07/20/18 72.3 kg     Intake/Output Summary (Last 24 hours) at  07/26/2018 1351 Last data filed at 07/26/2018 1331 Gross per 24 hour  Intake 1899.22 ml  Output -  Net 1899.22 ml    Physical Exam:   GENERAL: Pleasant-appearing in no apparent distress.  HEAD, EYES, EARS, NOSE AND THROAT: Atraumatic, normocephalic. Extraocular muscles are intact. Pupils equal and reactive to light. Sclerae anicteric. No conjunctival injection. No oro-pharyngeal erythema.  NECK: Supple. There is no jugular venous distention. No bruits, no lymphadenopathy, no thyromegaly.  HEART: Regular rate and rhythm,. No murmurs, no rubs, no clicks.  LUNGS: Clear to auscultation bilaterally.  No rales or rhonchi. No wheezes.  ABDOMEN: Soft, flat, left lower quadrant tenderness nondistended. Has good bowel sounds. No hepatosplenomegaly appreciated.  EXTREMITIES: No evidence of any cyanosis, clubbing, or peripheral edema.  +2 pedal and radial pulses bilaterally.  NEUROLOGIC: The patient is alert, awake, and oriented x3 with no focal motor or sensory deficits appreciated bilaterally.  SKIN: Moist and warm with no rashes appreciated.  Psych: Not anxious, depressed LN: No inguinal LN enlargement    Antibiotics   Anti-infectives (From admission, onward)   None      Medications   Scheduled Meds: . citalopram  10 mg Oral Daily  . clopidogrel  75 mg Oral QHS  . enoxaparin (LOVENOX) injection  40 mg Subcutaneous Q24H  . [START ON 07/27/2018] Influenza vac split quadrivalent PF  0.5 mL Intramuscular Tomorrow-1000  . lisinopril  20 mg Oral QPM  . nebivolol  5 mg Oral Daily  . pantoprazole  40 mg Oral Daily  . rosuvastatin  20 mg Oral QHS   Continuous Infusions: PRN Meds:.acetaminophen **OR** acetaminophen, morphine injection, ondansetron **OR** ondansetron (ZOFRAN) IV   Data Review:   Micro Results No results found for this or any previous visit (from the past 240 hour(s)).  Radiology Reports Ct Abdomen Pelvis W Contrast  Result Date: 07/25/2018 CLINICAL DATA:  Abdominal  pain. Nausea and vomiting. Constipation. Bladder biopsy on 07/20/2018 with ureteroscopy. EXAM: CT ABDOMEN AND PELVIS WITH CONTRAST TECHNIQUE: Multidetector CT imaging of the abdomen and pelvis was performed using the standard protocol following bolus administration of intravenous contrast. CONTRAST:  14mL ISOVUE-300 IOPAMIDOL (ISOVUE-300) INJECTION 61% COMPARISON:  06/20/2018 FINDINGS: Lower chest: Descending thoracic aortic atherosclerotic calcification. Hepatobiliary: Unremarkable Pancreas: 1.1 by 1.1 cm hypodense lesion along the uncinate process of pancreas, image 23/2. Otherwise unremarkable. Spleen: Unremarkable Adrenals/Urinary Tract: Both adrenal glands appear normal. The previously dilated left infundibular are no longer dilated. There is some subtle enhancement of the left ureteral wall which could well be due to recent instrumentation. 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 No renal mass. Bladder wall thickness is within normal limits given the relative nondistention. No significant bladder wall hematoma is identified. Stomach/Bowel: Sigmoid colon diverticulosis without acute diverticulitis identified. Wall thickening in the sigmoid colon is likely attributable to the diverticulosis. Borderline dilated colon primarily filled with frothy fluid. There is mesenteric edema extending to the splenic flexure segment the colon. No pneumatosis or significant abnormal wall thickening in this vicinity. There is some trace fluid tracking along the left paracolic gutter. Vascular/Lymphatic: Dense aortic atherosclerotic vascular calcification. 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 The celiac trunk and superior mesenteric artery appear patent as does the inferior mesenteric artery. Reproductive: Uterus absent.  Adnexa unremarkable. Other: No supplemental non-categorized findings. Musculoskeletal: Old deformities of pelvis including the left superior pubic ramus and the right iliac bone. 3 mm  grade 1 degenerative anterolisthesis at L4-5 with disc bulge facet arthropathy contributing mild bilateral foraminal impingement at this level. Moderate degenerative hip arthropathy bilaterally. IMPRESSION: 1. Potential focal colitis involving the splenic flexure, with adjacent mesenteric edema. Borderline dilated colon noted primarily filled with frothy fluid. The cause of the potential colitis is uncertain. There is no pneumatosis or abscess. There is also sigmoid diverticulosis but no evidence of active diverticulitis. 2. Mild enhancement in the left ureteral wall, likely from recent instrumentation. Prior mild left hydronephrosis has resolved. 3. No significant bladder wall hematoma. 4. There is 1.1 by 1.1 cm hypodense lesion along the uncinate process of pancreas.  Although quite likely a cystic lesion, this rule require further workup to exclude neoplasm/malignancy. After resolution of the patient's acute symptoms and when the patient is able to hold her breath, pancreatic protocol MRI with and without contrast is recommended to further assess this lesion. 5.  Aortic Atherosclerosis (ICD10-I70.0). 6. 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 7. Old pelvic fractures, healed. 8. Mild bilateral foraminal impingement at L5-S1. Electronically Signed   By: Van Clines M.D.   On: 07/25/2018 22:25   US Venous Img Lower Bilateral  Result Date: 07/07/2018 CLINICAL DATA:  Bilateral lower extremity edema, status post coronary and renal stents 06/21/2018 EXAM: BILATERAL LOWER EXTREMITY VENOUS DOPPLER ULTRASOUND TECHNIQUE: Gray-scale sonography with graded compression, as well as color Doppler and duplex ultrasound were performed to evaluate the lower extremity deep venous systems from the level of the common femoral vein and including the common femoral, femoral, profunda femoral, popliteal and calf veins including the posterior tibial, peroneal and gastrocnemius veins when visible. The superficial great  saphenous vein was also interrogated. Spectral Doppler was utilized to evaluate flow at rest and with distal augmentation maneuvers in the common femoral, femoral and popliteal veins. COMPARISON:  None. FINDINGS: RIGHT LOWER EXTREMITY Common Femoral Vein: No evidence of thrombus. Normal compressibility, respiratory phasicity and response to augmentation. Saphenofemoral Junction: No evidence of thrombus. Normal compressibility and flow on color Doppler imaging. Profunda Femoral Vein: No evidence of thrombus. Normal compressibility and flow on color Doppler imaging. Femoral Vein: No evidence of thrombus. Normal compressibility, respiratory phasicity and response to augmentation. Popliteal Vein: No evidence of thrombus. Normal compressibility, respiratory phasicity and response to augmentation. Calf Veins: No evidence of thrombus. Normal compressibility and flow on color Doppler imaging. Superficial Great Saphenous Vein: No evidence of thrombus. Normal compressibility. Venous Reflux:  None. Other Findings:  None. LEFT LOWER EXTREMITY Common Femoral Vein: No evidence of thrombus. Normal compressibility, respiratory phasicity and response to augmentation. Saphenofemoral Junction: No evidence of thrombus. Normal compressibility and flow on color Doppler imaging. Profunda Femoral Vein: No evidence of thrombus. Normal compressibility and flow on color Doppler imaging. Femoral Vein: No evidence of thrombus. Normal compressibility, respiratory phasicity and response to augmentation. Popliteal Vein: No evidence of thrombus. Normal compressibility, respiratory phasicity and response to augmentation. Calf Veins: No evidence of thrombus. Normal compressibility and flow on color Doppler imaging. Superficial Great Saphenous Vein: No evidence of thrombus. Normal compressibility. Venous Reflux:  None. Other Findings:  None. IMPRESSION: No evidence of deep venous thrombosis. Electronically Signed   By: Jerilynn Mages.  Shick M.D.   On: 07/07/2018  12:10     CBC Recent Labs  Lab 07/25/18 2037 07/26/18 0155  WBC 14.5* 13.1*  HGB 14.6 13.5  HCT 43.8 40.8  PLT 246 234  MCV 92.8 93.2  MCH 30.9 30.8  MCHC 33.3 33.1  RDW 12.5 12.6  LYMPHSABS 1.3  --   MONOABS 0.5  --   EOSABS 0.0  --   BASOSABS 0.1  --     Chemistries  Recent Labs  Lab 07/25/18 2037 07/26/18 0155  NA 137 138  K 3.7 4.1  CL 103 106  CO2 21* 23  GLUCOSE 206* 156*  BUN 16 17  CREATININE 0.99 0.88  CALCIUM 9.7 9.0  AST 20  --   ALT 15  --   ALKPHOS 84  --   BILITOT 0.9  --    ------------------------------------------------------------------------------------------------------------------ estimated creatinine clearance is 48.9 mL/min (by C-G formula based on SCr of 0.88 mg/dL). ------------------------------------------------------------------------------------------------------------------ No results  for input(s): HGBA1C in the last 72 hours. ------------------------------------------------------------------------------------------------------------------ No results for input(s): CHOL, HDL, LDLCALC, TRIG, CHOLHDL, LDLDIRECT in the last 72 hours. ------------------------------------------------------------------------------------------------------------------ No results for input(s): TSH, T4TOTAL, T3FREE, THYROIDAB in the last 72 hours.  Invalid input(s): FREET3 ------------------------------------------------------------------------------------------------------------------ No results for input(s): VITAMINB12, FOLATE, FERRITIN, TIBC, IRON, RETICCTPCT in the last 72 hours.  Coagulation profile No results for input(s): INR, PROTIME in the last 168 hours.  No results for input(s): DDIMER in the last 72 hours.  Cardiac Enzymes No results for input(s): CKMB, TROPONINI, MYOGLOBIN in the last 168 hours.  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 Vicente Males to see if we should continue GoLYTELY recommends continuing this #2 Coronary artery disease -continue home meds #3 HTN (hypertension) -home dose antihypertensives #4 Anxiety, generalized -home dose anxiolytic #5  HLD (hyperlipidemia) -Home dose antilipid #6 GERD (gastroesophageal reflux disease) -PPI     Code Status Orders  (From admission, onward)         Start     Ordered   07/26/18 0121  Do not attempt resuscitation (DNR)  Continuous    Question Answer Comment  In the event of cardiac or respiratory ARREST Do not call a "code blue"   In the event of cardiac or respiratory ARREST Do not perform Intubation, CPR, defibrillation or ACLS   In the event of cardiac or respiratory ARREST Use medication by any route, position, wound care, and other measures to relive pain and suffering. May use oxygen, suction and manual treatment of airway obstruction as needed for comfort.   Comments nurse may pronounce      07/26/18 0120        Code Status History    Date Active Date Inactive Code Status Order ID Comments User Context   06/19/2018 1608 06/23/2018 1925 DNR [id number]  Loletha Grayer, MD ED   09/24/2015 1828 09/25/2015 1826 Full Code [id number]  Nicholes Mango, MD Inpatient   12/19/2014 1523 12/20/2014 1507 Full Code [id number]  Algernon Huxley, MD Inpatient   12/12/2014 1254 12/13/2014 2004 Full Code [id number]  Bettey Costa, MD Inpatient    Advance Directive Documentation     Most Recent Value  Type of Advance Directive  Healthcare Power of Attorney  Pre-existing out of facility DNR order (yellow form or pink MOST form)  -  "MOST" Form in Place?  -            Consults gastroenterology  DVT Prophylaxis  Lovenox   Lab Results  Component Value Date   PLT 234 07/26/2018     Time Spent in minutes 60min  12pm to 1245 pmGreater than 50% of time spent in care coordination and counseling patient regarding the condition and plan of care.   Dustin Flock M.D on 07/26/2018 at 1:51 PM  Between 7am to 6pm - Pager - [phone]  After 6pm go to [email or web address] - Proofreader  [employer]   Office  [phone]
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
## 2018-07-27 DIAGNOSIS — K529 Noninfective gastroenteritis and colitis, unspecified: Secondary | ICD-10-CM

## 2018-07-27 LAB — BASIC METABOLIC PANEL
Anion gap: 6 (ref 5–15)
BUN: 13 mg/dL (ref 8–23)
CO2: 24 mmol/L (ref 22–32)
Calcium: 8.2 mg/dL — ABNORMAL LOW (ref 8.9–10.3)
Chloride: 110 mmol/L (ref 98–111)
Creatinine, Ser: 0.78 mg/dL (ref 0.44–1.00)
GFR calc Af Amer: >60 mL/min (ref >60)
GFR calc non Af Amer: >60 mL/min (ref >60)
Glucose, Bld: 97 mg/dL (ref 70–99)
Potassium: 3.5 mmol/L (ref 3.5–5.1)
Sodium: 140 mmol/L (ref 135–145)

## 2018-07-27 LAB — CBC
HCT: 34.6 % — ABNORMAL LOW (ref 36.0–46.0)
Hemoglobin: 11.1 g/dL — ABNORMAL LOW (ref 12.0–15.0)
MCH: 30.7 pg (ref 26.0–34.0)
MCHC: 32.1 g/dL (ref 30.0–36.0)
MCV: 95.6 fL (ref 80.0–100.0)
Platelets: 196 10*3/uL (ref 150–400)
RBC: 3.62 MIL/uL — AB (ref 3.87–5.11)
RDW: 13 % (ref 11.5–15.5)
WBC: 5.5 10*3/uL (ref 4.0–10.5)
nRBC: 0 % (ref 0.0–0.2)

## 2018-07-27 MED ORDER — DOCUSATE SODIUM 100 MG PO CAPS: 100.0000 mg | ORAL_CAPSULE | Freq: Two times a day (BID) | ORAL | 0 refills | Status: DC

## 2018-07-27 MED ORDER — POLYETHYLENE GLYCOL 3350 17 G PO PACK
17.0000 g | PACK | Freq: Every day | ORAL | Status: DC
  Filled 2018-07-27: qty 1

## 2018-07-27 MED ORDER — POLYETHYLENE GLYCOL 3350 17 G PO PACK: 17.0000 g | PACK | Freq: Every day | ORAL | 0 refills | Status: DC

## 2018-07-27 NOTE — Progress Notes (Signed)
Offered patient help with a bath.  She wants to wait until later in the morning.  She wants to put on her clothes so she will be ready for discharge.  I provided her with towels and wash cloths.

## 2018-07-27 NOTE — Care Management Obs Status (Signed)
[place] NOTIFICATION   Patient Details  Name: Kelly Ritter MRN: [id number] Date of Birth: 06/28/1934   Medicare Observation Status Notification Given:  Yes    Ester Sessions, RN 07/27/2018, 1:36 PM

## 2018-07-27 NOTE — Progress Notes (Signed)
Pt is in no acute distress. No lower abdominal pain but has R and L upper quadrant pain with deep breaths. Pt said her last BM was mid day yesterday. She is tolerating a soft diet. Denies nausea. VSS. Will continue to monitor.

## 2018-07-27 NOTE — Discharge Summary (Signed)
[place] [facility], 84 y.o., DOB 1934/03/26, MRN [id number]. Admission date: 07/25/2018 Discharge Date 07/27/2018 Primary MD Glean Hess, MD Admitting Physician Lance Coon, MD  Admission Diagnosis  Colitis [K52.9] Constipation, unspecified constipation type [K59.00]  Discharge Diagnosis   Principal Problem:   Abdominal pain Severe constipation Mild focal colitis   Coronary artery disease   HTN (hypertension)   Anxiety, generalized   HLD (hyperlipidemia)   GERD (gastroesophageal reflux disease) Newly diagnosed amyloidosis involving kidneys Pancreatic lesion outpatient GI follow-up      Kelly Ritter  is a 84 y.o. female who presents with chief complaint as above.  Patient presents from home with complaint of abdominal pain.  She states that this started today and was accompanied with significant nausea and some vomiting.  She recently had  urological procedures and instrumentation.  Patient presented to the emergency room and was noted to have focal colitis on CT scan she was severely constipated.  Patient received GoLYTELY treatment and had significant bowel movements.  Subsequently her abdominal pain resolved.  Patient was also seen by GI who recommended outpatient GI follow-up for colonoscopy.  There was no need for antibiotics per GI.            Consults  GI  Significant Tests:  See full reports for all details    Ct Abdomen Pelvis W Contrast  Result Date: 07/25/2018 CLINICAL DATA:  Abdominal pain. Nausea and vomiting. Constipation. Bladder biopsy on 07/20/2018 with ureteroscopy. EXAM: CT ABDOMEN AND PELVIS WITH CONTRAST TECHNIQUE: Multidetector CT imaging of the abdomen and pelvis was performed using the standard protocol following bolus administration of intravenous contrast. CONTRAST:  177mL ISOVUE-300 IOPAMIDOL (ISOVUE-300) INJECTION 61% COMPARISON:  06/20/2018 FINDINGS: Lower chest:  Descending thoracic aortic atherosclerotic calcification. Hepatobiliary: Unremarkable Pancreas: 1.1 by 1.1 cm hypodense lesion along the uncinate process of pancreas, image 23/2. Otherwise unremarkable. Spleen: Unremarkable Adrenals/Urinary Tract: Both adrenal glands appear normal. The previously dilated left infundibular are no longer dilated. There is some subtle enhancement of the left ureteral wall which could well be due to recent instrumentation. 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 No renal mass. Bladder wall thickness is within normal limits given the relative nondistention. No significant bladder wall hematoma is identified. Stomach/Bowel: Sigmoid colon diverticulosis without acute diverticulitis identified. Wall thickening in the sigmoid colon is likely attributable to the diverticulosis. Borderline dilated colon primarily filled with frothy fluid. There is mesenteric edema extending to the splenic flexure segment the colon. No pneumatosis or significant abnormal wall thickening in this vicinity. There is some trace fluid tracking along the left paracolic gutter. Vascular/Lymphatic: Dense aortic atherosclerotic vascular calcification. 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 The celiac trunk and superior mesenteric artery appear patent as does the inferior mesenteric artery. Reproductive: Uterus absent.  Adnexa unremarkable. Other: No supplemental non-categorized findings. Musculoskeletal: Old deformities of pelvis including the left superior pubic ramus and the right iliac bone. 3 mm grade 1 degenerative anterolisthesis at L4-5 with disc bulge facet arthropathy contributing mild bilateral foraminal impingement at this level. Moderate degenerative hip arthropathy bilaterally. IMPRESSION: 1. Potential focal colitis involving the splenic flexure, with adjacent mesenteric edema. Borderline dilated colon noted primarily filled with frothy fluid. The cause of the potential colitis is  uncertain. There is no pneumatosis or abscess. There is also sigmoid diverticulosis but no evidence of active diverticulitis. 2. Mild enhancement in the left ureteral wall, likely from recent instrumentation. Prior  mild left hydronephrosis has resolved. 3. No significant bladder wall hematoma. 4. There is 1.1 by 1.1 cm hypodense lesion along the uncinate process of pancreas. Although quite likely a cystic lesion, this rule require further workup to exclude neoplasm/malignancy. After resolution of the patient's acute symptoms and when the patient is able to hold her breath, pancreatic protocol MRI with and without contrast is recommended to further assess this lesion. 5.  Aortic Atherosclerosis (ICD10-I70.0). 6. 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 7. Old pelvic fractures, healed. 8. Mild bilateral foraminal impingement at L5-S1. Electronically Signed   By: Van Clines M.D.   On: 07/25/2018 22:25   US Venous Img Lower Bilateral  Result Date: 07/07/2018 CLINICAL DATA:  Bilateral lower extremity edema, status post coronary and renal stents 06/21/2018 EXAM: BILATERAL LOWER EXTREMITY VENOUS DOPPLER ULTRASOUND TECHNIQUE: Gray-scale sonography with graded compression, as well as color Doppler and duplex ultrasound were performed to evaluate the lower extremity deep venous systems from the level of the common femoral vein and including the common femoral, femoral, profunda femoral, popliteal and calf veins including the posterior tibial, peroneal and gastrocnemius veins when visible. The superficial great saphenous vein was also interrogated. Spectral Doppler was utilized to evaluate flow at rest and with distal augmentation maneuvers in the common femoral, femoral and popliteal veins. COMPARISON:  None. FINDINGS: RIGHT LOWER EXTREMITY Common Femoral Vein: No evidence of thrombus. Normal compressibility, respiratory phasicity and response to augmentation. Saphenofemoral Junction: No evidence of thrombus.  Normal compressibility and flow on color Doppler imaging. Profunda Femoral Vein: No evidence of thrombus. Normal compressibility and flow on color Doppler imaging. Femoral Vein: No evidence of thrombus. Normal compressibility, respiratory phasicity and response to augmentation. Popliteal Vein: No evidence of thrombus. Normal compressibility, respiratory phasicity and response to augmentation. Calf Veins: No evidence of thrombus. Normal compressibility and flow on color Doppler imaging. Superficial Great Saphenous Vein: No evidence of thrombus. Normal compressibility. Venous Reflux:  None. Other Findings:  None. LEFT LOWER EXTREMITY Common Femoral Vein: No evidence of thrombus. Normal compressibility, respiratory phasicity and response to augmentation. Saphenofemoral Junction: No evidence of thrombus. Normal compressibility and flow on color Doppler imaging. Profunda Femoral Vein: No evidence of thrombus. Normal compressibility and flow on color Doppler imaging. Femoral Vein: No evidence of thrombus. Normal compressibility, respiratory phasicity and response to augmentation. Popliteal Vein: No evidence of thrombus. Normal compressibility, respiratory phasicity and response to augmentation. Calf Veins: No evidence of thrombus. Normal compressibility and flow on color Doppler imaging. Superficial Great Saphenous Vein: No evidence of thrombus. Normal compressibility. Venous Reflux:  None. Other Findings:  None. IMPRESSION: No evidence of deep venous thrombosis. Electronically Signed   By: Jerilynn Mages.  Shick M.D.   On: 07/07/2018 12:10       Today   Subjective:   Kelly Ritter patient doing well denies any abdominal pain Objective:   Blood pressure 134/60, pulse 66, temperature 98 F (36.7 C), temperature source Oral, resp. rate 17, height 5\' 5"  (1.651 m), weight 71.8 kg, SpO2 96 %.  .  Intake/Output Summary (Last 24 hours) at 07/27/2018 1327 Last data filed at 07/27/2018 1024 Gross per 24 hour  Intake 1261.61  ml  Output -  Net 1261.61 ml    Exam VITAL SIGNS: Blood pressure 134/60, pulse 66, temperature 98 F (36.7 C), temperature source Oral, resp. rate 17, height 5\' 5"  (1.651 m), weight 71.8 kg, SpO2 96 %.  GENERAL:  84 y.o.-year-old patient lying in the bed with no acute distress.  EYES: Pupils  equal, round, reactive to light and accommodation. No scleral icterus. Extraocular muscles intact.  HEENT: Head atraumatic, normocephalic. Oropharynx and nasopharynx clear.  NECK:  Supple, no jugular venous distention. No thyroid enlargement, no tenderness.  LUNGS: Normal breath sounds bilaterally, no wheezing, rales,rhonchi or crepitation. No use of accessory muscles of respiration.  CARDIOVASCULAR: S1, S2 normal. No murmurs, rubs, or gallops.  ABDOMEN: Soft, nontender, nondistended. Bowel sounds present. No organomegaly or mass.  EXTREMITIES: No pedal edema, cyanosis, or clubbing.  NEUROLOGIC: Cranial nerves II through XII are intact. Muscle strength 5/5 in all extremities. Sensation intact. Gait not checked.  PSYCHIATRIC: The patient is alert and oriented x 3.  SKIN: No obvious rash, lesion, or ulcer.   Data Review     CBC w Diff:  Lab Results  Component Value Date   WBC 5.5 07/27/2018   HGB 11.1 (L) 07/27/2018   HGB 13.5 03/24/2018   HCT 34.6 (L) 07/27/2018   HCT 40.4 03/24/2018   PLT 196 07/27/2018   PLT 229 03/24/2018   LYMPHOPCT 9 07/25/2018   MONOPCT 4 07/25/2018   EOSPCT 0 07/25/2018   BASOPCT 0 07/25/2018   CMP:  Lab Results  Component Value Date   NA 140 07/27/2018   NA 142 03/24/2018   NA 140 10/01/2012   K 3.5 07/27/2018   K 4.3 10/01/2012   CL 110 07/27/2018   CL 109 (H) 10/01/2012   CO2 24 07/27/2018   CO2 23 10/01/2012   BUN 13 07/27/2018   BUN 23 03/24/2018   BUN 16 10/01/2012   CREATININE 0.78 07/27/2018   CREATININE 0.63 10/01/2012   PROT 7.2 07/25/2018   PROT 6.9 03/24/2018   ALBUMIN 4.1 07/25/2018   ALBUMIN 4.5 03/24/2018   BILITOT 0.9 07/25/2018    BILITOT 0.3 03/24/2018   ALKPHOS 84 07/25/2018   AST 20 07/25/2018   ALT 15 07/25/2018  .  Micro Results No results found for this or any previous visit (from the past 240 hour(s)).      Code Status Orders  (From admission, onward)         Start     Ordered   07/26/18 0121  Do not attempt resuscitation (DNR)  Continuous    Question Answer Comment  In the event of cardiac or respiratory ARREST Do not call a "code blue"   In the event of cardiac or respiratory ARREST Do not perform Intubation, CPR, defibrillation or ACLS   In the event of cardiac or respiratory ARREST Use medication by any route, position, wound care, and other measures to relive pain and suffering. May use oxygen, suction and manual treatment of airway obstruction as needed for comfort.   Comments nurse may pronounce      07/26/18 0120        Code Status History    Date Active Date Inactive Code Status Order ID Comments User Context   06/19/2018 1608 06/23/2018 1925 DNR [id number]  Loletha Grayer, MD ED   09/24/2015 1828 09/25/2015 1826 Full Code [id number]  Nicholes Mango, MD Inpatient   12/19/2014 1523 12/20/2014 1507 Full Code [id number]  Algernon Huxley, MD Inpatient   12/12/2014 1254 12/13/2014 2004 Full Code [id number]  Bettey Costa, MD Inpatient    Advance Directive Documentation     Most Recent Value  Type of Advance Directive  Healthcare Power of Attorney  Pre-existing out of facility DNR order (yellow form or pink MOST form)  -  "MOST" Form in Place?  -  Follow-up Information    Glean Hess, MD Follow up in 6 day(s).   Specialty:  Internal Medicine Why:  hosp f/u Contact information: [place] [address] [phone]        Jonathon Bellows, MD Follow up in 3 week(s).   Specialty:  Gastroenterology Why:  hosp f/u need for colonscopy Contact information: [place] [address] [phone]           Discharge Medications    Allergies as of 07/27/2018      Reactions   Sulfa Antibiotics Hives, Itching, Swelling, Rash   Baclofen Other (See Comments)   Muscle spasms and cramps in shoulder area   Latex Rash, Hives, Other (See Comments)   Skin eruptions    Donepezil Other (See Comments)   Crazy feeling      Medication List    TAKE these medications   acetaminophen 650 MG CR tablet Commonly known as:  TYLENOL Take 1,300 mg by mouth at bedtime as needed for pain.   B-12 500 MCG Tabs Take 1 tablet by mouth daily. What changed:  how much to take   citalopram 10 MG tablet Commonly known as:  CELEXA Take 1 tablet (10 mg total) by mouth daily.   clopidogrel 75 MG tablet Commonly known as:  PLAVIX Take 75 mg by mouth at bedtime.   docusate sodium 100 MG capsule Commonly known as:  COLACE Take 1 capsule (100 mg total) by mouth 2 (two) times daily.   lisinopril 20 MG tablet Commonly known as:  PRINIVIL,ZESTRIL Take 20 mg by mouth every evening.   nebivolol 5 MG tablet Commonly known as:  BYSTOLIC Take 5 mg by mouth daily.   oxybutynin 5 MG tablet Commonly known as:  DITROPAN Take 1 tablet (5 mg total) by mouth every 8 (eight) hours as needed for bladder spasms.   oxymetazoline 0.05 % nasal spray Commonly known as:  AFRIN Place 1 spray into both nostrils at bedtime as needed for congestion.   polyethylene glycol packet Commonly known as:  MIRALAX / GLYCOLAX Take 17 g by mouth daily.   potassium gluconate 595 (99 K) MG Tabs tablet Take 595 mg by mouth at bedtime as needed (leg cramps).   rosuvastatin 20 MG tablet Commonly known as:  CRESTOR Take 20 mg by mouth at bedtime.   traMADol 50 MG tablet Commonly known as:  ULTRAM Take 50 mg by mouth every 6 (six) hours as needed for moderate pain.          Total Time in preparing paper work, data evaluation and todays exam - 25 minutes  Dustin Flock M.D on 07/27/2018 at [place]  [phone]

## 2018-07-27 NOTE — Care Management CC44 (Signed)
Condition Code 44 Documentation Completed  Patient Details  Name: ASLYNN BRUNETTI MRN: [id number] Date of Birth: 06/04/1934   Condition Code 44 given:  Yes Patient signature on Condition Code 44 notice:  Yes Documentation of 2 MD's agreement:  Yes Code 44 added to claim:  Yes    Chong Sessions, RN 07/27/2018, 1:36 PM

## 2018-07-27 NOTE — Progress Notes (Signed)
Jonathon Bellows , MD [address] [place], [place], [place], [address] [address] [place], [place], [place], [address] Phone: [phone]  Fax: [phone]   Kelly Ritter is being followed for colitis  Day 1 of follow up   Subjective: Doing well no complaints    Objective: Vital signs in last 24 hours: Vitals:   07/26/18 0121 07/26/18 1654 07/26/18 2356 07/27/18 0753  BP:  (!) 118/47 (!) 96/43 134/60  Pulse:  65 66 66  Resp:  18 17   Temp:  98 F (36.7 C) 98.3 F (36.8 C) 98 F (36.7 C)  TempSrc:   Oral Oral  SpO2:  96% 96% 96%  Weight: 71.8 kg     Height: 5\' 5"  (1.651 m)      Weight change:   Intake/Output Summary (Last 24 hours) at 07/27/2018 1008 Last data filed at 07/26/2018 1331 Gross per 24 hour  Intake 781.61 ml  Output -  Net 781.61 ml     Exam: Heart:: Regular rate and rhythm, S1S2 present or without murmur or extra heart sounds Lungs: normal, clear to auscultation and clear to auscultation and percussion Abdomen: soft, nontender, normal bowel sounds   Lab Results: @LABTEST2 @ Micro Results: No results found for this or any previous visit (from the past 240 hour(s)). Studies/Results: Ct Abdomen Pelvis W Contrast  Result Date: 07/25/2018 CLINICAL DATA:  Abdominal pain. Nausea and vomiting. Constipation. Bladder biopsy on 07/20/2018 with ureteroscopy. EXAM: CT ABDOMEN AND PELVIS WITH CONTRAST TECHNIQUE: Multidetector CT imaging of the abdomen and pelvis was performed using the standard protocol following bolus administration of intravenous contrast. CONTRAST:  118mL ISOVUE-300 IOPAMIDOL (ISOVUE-300) INJECTION 61% COMPARISON:  06/20/2018 FINDINGS: Lower chest: Descending thoracic aortic atherosclerotic calcification. Hepatobiliary: Unremarkable Pancreas: 1.1 by 1.1 cm hypodense lesion along the uncinate process of pancreas, image 23/2. Otherwise unremarkable. Spleen: Unremarkable Adrenals/Urinary Tract: Both adrenal glands appear normal. The  previously dilated left infundibular are no longer dilated. There is some subtle enhancement of the left ureteral wall which could well be due to recent instrumentation. 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 No renal mass. Bladder wall thickness is within normal limits given the relative nondistention. No significant bladder wall hematoma is identified. Stomach/Bowel: Sigmoid colon diverticulosis without acute diverticulitis identified. Wall thickening in the sigmoid colon is likely attributable to the diverticulosis. Borderline dilated colon primarily filled with frothy fluid. There is mesenteric edema extending to the splenic flexure segment the colon. No pneumatosis or significant abnormal wall thickening in this vicinity. There is some trace fluid tracking along the left paracolic gutter. Vascular/Lymphatic: Dense aortic atherosclerotic vascular calcification. 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 The celiac trunk and superior mesenteric artery appear patent as does the inferior mesenteric artery. Reproductive: Uterus absent.  Adnexa unremarkable. Other: No supplemental non-categorized findings. Musculoskeletal: Old deformities of pelvis including the left superior pubic ramus and the right iliac bone. 3 mm grade 1 degenerative anterolisthesis at L4-5 with disc bulge facet arthropathy contributing mild bilateral foraminal impingement at this level. Moderate degenerative hip arthropathy bilaterally. IMPRESSION: 1. Potential focal colitis involving the splenic flexure, with adjacent mesenteric edema. Borderline dilated colon noted primarily filled with frothy fluid. The cause of the potential colitis is uncertain. There is no pneumatosis or abscess. There is also sigmoid diverticulosis but no evidence of active diverticulitis. 2. Mild enhancement in the left ureteral wall, likely from recent instrumentation. Prior mild left hydronephrosis has resolved. 3. No significant bladder wall  hematoma. 4.  There is 1.1 by 1.1 cm hypodense lesion along the uncinate process of pancreas. Although quite likely a cystic lesion, this rule require further workup to exclude neoplasm/malignancy. After resolution of the patient's acute symptoms and when the patient is able to hold her breath, pancreatic protocol MRI with and without contrast is recommended to further assess this lesion. 5.  Aortic Atherosclerosis (ICD10-I70.0). 6. 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 7. Old pelvic fractures, healed. 8. Mild bilateral foraminal impingement at L5-S1. Electronically Signed   By: Van Clines M.D.   On: 07/25/2018 22:25   Medications: I have reviewed the patient's current medications. Scheduled Meds: . citalopram  10 mg Oral Daily  . clopidogrel  75 mg Oral QHS  . enoxaparin (LOVENOX) injection  40 mg Subcutaneous Q24H  . lisinopril  20 mg Oral QPM  . nebivolol  5 mg Oral Daily  . pantoprazole  40 mg Oral Daily  . rosuvastatin  20 mg Oral QHS   Continuous Infusions: PRN Meds:.acetaminophen **OR** acetaminophen, morphine injection, ondansetron **OR** ondansetron (ZOFRAN) IV   Assessment: Principal Problem:   Abdominal pain Active Problems:   Coronary artery disease   HTN (hypertension)   Anxiety, generalized   HLD (hyperlipidemia)   GERD (gastroesophageal reflux disease)  Kelly Ritter is a 84 y.o. y/o female presented to the ER with abdominal pain and not had a bowel movement for 5 days.  CT scan of the abdomen showed a colitis-like features in the splenic flexure.  These findings were not noted on a CAT scan in November 2019 just a few weeks back.  There is also an abnormality incidentally seen on the uncinate process of the pancreas which need further evaluation.  .  The location of the colitis is highly suspicious for an episode of ischemic colitis.  I do note that she had a cystoscopy on 07/20/2018. It is possible that there might have been an episode of hypotension and it  might have led to this episode of ischemic colitis.    Plan 1. For presumed ischemic colitis the treatment would be to maintain adequate blood pressure and maintain intravascular status  As an outpatient she will probably require a colonoscopy and CT scan of the abdomen pancreatic mass protocol.  She states she cannot undergo an MRI because of metal pieces in her eye.  I will sign off.  Please call me if any further GI concerns or questions.  We would like to thank you for the opportunity to participate in the care of SAHARRA SANTO.    LOS: 1 day   Jonathon Bellows, MD 07/27/2018, 10:08 AM

## 2018-08-03 ENCOUNTER — Telehealth: Payer: Self-pay

## 2018-08-03 NOTE — Telephone Encounter (Signed)
EMMI Follow-up: I received a voice message from Kelly Ritter and she was checking to see why she had been receiving some automated calls.  She had been told not to respond yes to any automated calls so she did not take the calls when they came in.  I explained our process of the automated calls post discharge and just checking to see how she was doing, if she had gotten her Rx's filled and was aware of her follow-up appointments and she responded yes to the later questions.  She said everything was going fine and thanked me for calling to explain the reason of the calls.

## 2018-08-06 ENCOUNTER — Ambulatory Visit (INDEPENDENT_AMBULATORY_CARE_PROVIDER_SITE_OTHER): Payer: Medicare Other

## 2018-08-06 ENCOUNTER — Telehealth: Payer: Self-pay | Admitting: Urology

## 2018-08-06 VITALS — BP 162/71 | HR 86 | Ht 65.0 in | Wt 156.0 lb

## 2018-08-06 DIAGNOSIS — R310 Gross hematuria: Secondary | ICD-10-CM

## 2018-08-06 LAB — URINALYSIS, COMPLETE
Bilirubin, UA: NEGATIVE
Glucose, UA: NEGATIVE
Nitrite, UA: POSITIVE — AB
Specific Gravity, UA: 1.025 (ref 1.005–1.030)
Urobilinogen, Ur: 0.2 mg/dL (ref 0.2–1.0)
pH, UA: 6 (ref 5.0–7.5)

## 2018-08-06 LAB — MICROSCOPIC EXAMINATION
RBC, UA: >30 /hpf — ABNORMAL HIGH (ref 0–2)
WBC, UA: >30 /hpf — ABNORMAL HIGH (ref 0–5)

## 2018-08-06 LAB — BLADDER SCAN AMB NON-IMAGING

## 2018-08-06 MED ORDER — CIPROFLOXACIN HCL 500 MG PO TABS: 500.0000 mg | ORAL_TABLET | Freq: Two times a day (BID) | ORAL | 0 refills | Status: DC

## 2018-08-06 NOTE — Progress Notes (Signed)
Bladder Scan Patient can void: 0 ml Performed By: Shawnie Dapper, CMA  Patient present today complaining of gross hematuria and clots for two days. Patient also complaining of dysuria. Denies fever, chills and vomiting. Due to patients hx of clot retention, PVR performed today. Patient is emptying well.  UA today does look grossly positive for infection. Will culture and call with results.  Per Dr. Diamantina Providence patient was notified that UA was positive and will start on Cipro 500mg  BID for 7 days and will call with culture results

## 2018-08-06 NOTE — Telephone Encounter (Signed)
Pt called office stating that she noticed blood in urine last night, spoke to [place] who asked me to add to nurse schedule for UA and PVR.

## 2018-08-07 ENCOUNTER — Inpatient Hospital Stay: Payer: Medicare Other | Admitting: Internal Medicine

## 2018-08-08 LAB — CULTURE, URINE COMPREHENSIVE

## 2018-08-18 DIAGNOSIS — G5603 Carpal tunnel syndrome, bilateral upper limbs: Secondary | ICD-10-CM | POA: Insufficient documentation

## 2018-08-18 DIAGNOSIS — E8582 Wild-type transthyretin-related (ATTR) amyloidosis: Secondary | ICD-10-CM | POA: Insufficient documentation

## 2018-08-24 ENCOUNTER — Encounter: Payer: Self-pay | Admitting: Internal Medicine

## 2018-08-24 DIAGNOSIS — E854 Organ-limited amyloidosis: Secondary | ICD-10-CM

## 2018-08-24 HISTORY — DX: Organ-limited amyloidosis: E85.4

## 2018-08-26 ENCOUNTER — Ambulatory Visit
Admission: RE | Admit: 2018-08-26 | Discharge: 2018-08-26 | Disposition: A | Discharge status: Home / Self Care | Payer: Medicare Other | Source: Ambulatory Visit | Attending: Urology | Admitting: Urology

## 2018-08-26 ENCOUNTER — Ambulatory Visit: Payer: Medicare Other

## 2018-08-26 ENCOUNTER — Ambulatory Visit: Payer: Medicare Other | Admitting: Gastroenterology

## 2018-08-26 DIAGNOSIS — N1330 Unspecified hydronephrosis: Secondary | ICD-10-CM | POA: Insufficient documentation

## 2018-09-01 ENCOUNTER — Ambulatory Visit
Admission: RE | Admit: 2018-09-01 | Discharge: 2018-09-01 | Disposition: A | Discharge status: Home / Self Care | Payer: Medicare Other | Attending: Internal Medicine | Admitting: Internal Medicine

## 2018-09-01 ENCOUNTER — Encounter: Payer: Self-pay | Admitting: Internal Medicine

## 2018-09-01 ENCOUNTER — Ambulatory Visit (INDEPENDENT_AMBULATORY_CARE_PROVIDER_SITE_OTHER): Payer: Medicare Other | Admitting: Internal Medicine

## 2018-09-01 ENCOUNTER — Ambulatory Visit
Admission: RE | Admit: 2018-09-01 | Discharge: 2018-09-01 | Disposition: A | Discharge status: Home / Self Care | Payer: Medicare Other | Source: Ambulatory Visit | Attending: Internal Medicine | Admitting: Internal Medicine

## 2018-09-01 VITALS — BP 142/78 | HR 84 | Ht 65.0 in | Wt 156.0 lb

## 2018-09-01 DIAGNOSIS — L03031 Cellulitis of right toe: Secondary | ICD-10-CM

## 2018-09-01 DIAGNOSIS — E854 Organ-limited amyloidosis: Secondary | ICD-10-CM | POA: Diagnosis not present

## 2018-09-01 DIAGNOSIS — G2581 Restless legs syndrome: Secondary | ICD-10-CM | POA: Diagnosis not present

## 2018-09-01 MED ORDER — ROPINIROLE HCL 0.25 MG PO TABS: 0.2500 mg | ORAL_TABLET | Freq: Every evening | ORAL | 2 refills | Status: DC

## 2018-09-01 MED ORDER — AMOXICILLIN-POT CLAVULANATE 875-125 MG PO TABS: 1.0000 | ORAL_TABLET | Freq: Two times a day (BID) | ORAL | 0 refills | Status: AC

## 2018-09-01 NOTE — Progress Notes (Signed)
Date:  09/01/2018   Name:  Kelly Ritter   DOB:  17-Mar-1934   MRN:  [id number]   Chief Complaint: Toe Pain  Toe Pain   Incident onset: unsure of an incident but noted redness and swelling started 2-3 weeks ago. There was no injury mechanism. The pain is present in the right foot. The quality of the pain is described as burning and aching. The pain is mild. The pain has been worsening since onset. The symptoms are aggravated by weight bearing and palpation.  RLS - on requip in the past and did well but stopped medications just because she wanted to be on fewer meds. However, her sx are getting slightly worse and bothering her more.  She used to take meds only as needed. Pt was recently diagnosed with amyloidosis at [facility].    Review of Systems  Constitutional: Negative for chills, fatigue and fever.  Respiratory: Negative for chest tightness and shortness of breath.   Cardiovascular: Positive for leg swelling. Negative for chest pain and palpitations.  Skin: Positive for color change and wound.  Psychiatric/Behavioral: Negative for sleep disturbance.    Patient Active Problem List   Diagnosis Date Noted  . Amyloidosis ([place]) 08/24/2018  . HLD (hyperlipidemia) 07/26/2018  . GERD (gastroesophageal reflux disease) 07/26/2018  . Abdominal pain 07/26/2018  . Pyelonephritis 06/19/2018  . Osteoporosis of forearm 04/02/2018  . Closed fracture of lateral malleolus 01/13/2018  . B12 deficiency 08/14/2017  . Prediabetes 08/13/2017  . Coronary artery disease 05/06/2017  . Pure hypercholesterolemia 05/06/2017  . HTN (hypertension) 05/06/2017  . SOBOE (shortness of breath on exertion) 02/26/2017  . Anxiety, generalized 02/03/2017  . Post-concussion headache 02/03/2017  . Elevated TSH 10/22/2016  . Hematuria, microscopic 10/22/2016  . Primary osteoarthritis of hand 01/09/2016  . Arthralgia of multiple sites 12/18/2015  . Chronic hand pain, right 12/18/2015  . Encounter for long-term  (current) use of high-risk medication 12/18/2015  . RSD (reflex sympathetic dystrophy) 12/18/2015  . Chest pain 09/24/2015  . Personal history of other malignant neoplasm of skin 08/02/2015  . Benign essential hypertension 01/06/2015  . 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 CVA (cerebral infarction) 12/12/2014  . Bilateral carotid artery stenosis 10/21/2014  . Other allergic rhinitis 10/21/2014  . Paroxysmal supraventricular tachycardia ([place]) 07/05/2014    Allergies  Allergen Reactions  . Sulfa Antibiotics Hives, Itching, Swelling and Rash  . Baclofen Other (See Comments)    Muscle spasms and cramps in shoulder area  . Latex Rash, Hives and Other (See Comments)    Skin eruptions    . Donepezil Other (See Comments)    Crazy feeling    Past Surgical History:  Procedure Laterality Date  . ABDOMINAL HYSTERECTOMY  1979  . APPENDECTOMY  1956  . CARDIAC CATHETERIZATION N/A 09/25/2015   Procedure: Left Heart Cath and Coronary Angiography;  Surgeon: Teodoro Spray, MD;  Location: [facility];  Service: Cardiovascular;  Laterality: N/A;  . CAROTID STENT Left 2014   patient has currently 7 stents in her heart and 1 in left carotid.  Marland Kitchen COLONOSCOPY     removed polyps  . CYSTOSCOPY W/ URETERAL STENT PLACEMENT Left 07/20/2018   Procedure: CYSTOSCOPY WITH RETROGRADE PYELOGRAM/URETERAL STENT Exchange;  Surgeon: Hollice Espy, MD;  Location: [facility];  Service: Urology;  Laterality: Left;  . CYSTOSCOPY WITH STENT PLACEMENT Left 06/21/2018   Procedure: CYSTOSCOPY WITH STENT PLACEMENT;  Surgeon: Abbie Sons, MD;  Location: [facility];  Service: Urology;  Laterality:  Left;  . EYE SURGERY  2010   cataracts  . JOINT REPLACEMENT Right 1995   knee replacement  . PERIPHERAL VASCULAR CATHETERIZATION Left 12/19/2014   Procedure: Carotid PTA/Stent Intervention;  Surgeon: Algernon Huxley, MD;  Location: [facility];  Service: Cardiovascular;  Laterality: Left;  . Scrambler Therapy       for neuropathic pain  . URETERAL BIOPSY N/A 07/20/2018   Procedure: bladder biopsy ;  Surgeon: Hollice Espy, MD;  Location: [facility];  Service: Urology;  Laterality: N/A;    Social History   Tobacco Use  . Smoking status: Former Smoker    Packs/day: 1.00    Years: 8.00    Pack years: 8.00    Types: Cigarettes    Last attempt to quit: 08/12/1989    Years since quitting: 29.0  . Smokeless tobacco: Never Used  . Tobacco comment: smoking cessation materials not required  Substance Use Topics  . Alcohol use: Not Currently    Alcohol/week: 0.0 standard drinks  . Drug use: Never     Medication list has been reviewed and updated.  Current Meds  Medication Sig  . acetaminophen (TYLENOL) 650 MG CR tablet Take 1,300 mg by mouth at bedtime as needed for pain.   . citalopram (CELEXA) 10 MG tablet Take 1 tablet (10 mg total) by mouth daily.  . clopidogrel (PLAVIX) 75 MG tablet Take 75 mg by mouth at bedtime.   . Cyanocobalamin (B-12) 500 MCG TABS Take 1 tablet by mouth daily. (Patient taking differently: Take 500 mcg by mouth daily. )  . docusate sodium (COLACE) 100 MG capsule Take 1 capsule (100 mg total) by mouth 2 (two) times daily.  Marland Kitchen lisinopril (PRINIVIL,ZESTRIL) 20 MG tablet Take 20 mg by mouth every evening.  . nebivolol (BYSTOLIC) 5 MG tablet Take 5 mg by mouth daily.   Marland Kitchen oxymetazoline (AFRIN) 0.05 % nasal spray Place 1 spray into both nostrils at bedtime as needed for congestion.   . polyethylene glycol (MIRALAX / GLYCOLAX) packet Take 17 g by mouth daily.  . potassium gluconate 595 MG TABS tablet Take 595 mg by mouth at bedtime as needed (leg cramps).  . rosuvastatin (CRESTOR) 20 MG tablet Take 20 mg by mouth at bedtime.  . traMADol (ULTRAM) 50 MG tablet Take 50 mg by mouth every 6 (six) hours as needed for moderate pain.    PHQ 2/9 Scores 09/01/2018 03/18/2018 08/13/2017  PHQ - 2 Score 0 0 0  PHQ- 9 Score - 0 3    Physical Exam Vitals signs and nursing note reviewed.   Constitutional:      General: She is not in acute distress.    Appearance: She is well-developed.  HENT:     Head: Normocephalic and atraumatic.  Neck:     Musculoskeletal: Normal range of motion and neck supple.  Cardiovascular:     Rate and Rhythm: Normal rate and regular rhythm.     Pulses:          Dorsalis pedis pulses are 1+ on the right side and 1+ on the left side.       Posterior tibial pulses are 0 on the right side and 0 on the left side.  Pulmonary:     Effort: Pulmonary effort is normal. No respiratory distress.     Breath sounds: Normal breath sounds.  Musculoskeletal: Normal range of motion.       Feet:  Lymphadenopathy:     Cervical: No cervical adenopathy.  Skin:  General: Skin is warm and dry.     Findings: No rash.  Neurological:     Mental Status: She is alert and oriented to person, place, and time.  Psychiatric:        Behavior: Behavior normal.        Thought Content: Thought content normal.     BP (!) 142/78   Pulse 84   Ht 5\' 5"  (1.651 m)   Wt 156 lb (70.8 kg)   SpO2 97%   BMI 25.96 kg/m   Assessment and Plan: 1. Cellulitis of right toe Elevated, refer to Podiatry Xray to rule out osteomyelitis - amoxicillin-clavulanate (AUGMENTIN) 875-125 MG tablet; Take 1 tablet by mouth 2 (two) times daily for 10 days.  Dispense: 20 tablet; Refill: 0 - DG Foot Complete Right; Future - Ambulatory referral to Podiatry  2. Restless legs syndrome Resume requip qpm - rOPINIRole (REQUIP) 0.25 MG tablet; Take 1 tablet (0.25 mg total) by mouth every evening.  Dispense: 30 tablet; Refill: 2  3. Other amyloidosis ([place]) Follow up with [facility]   Partially dictated using [employer]. Any errors are unintentional.  Halina Maidens, MD [facility]  09/01/2018

## 2018-09-02 ENCOUNTER — Encounter: Payer: Self-pay | Admitting: Urology

## 2018-09-02 ENCOUNTER — Ambulatory Visit (INDEPENDENT_AMBULATORY_CARE_PROVIDER_SITE_OTHER): Payer: Medicare Other | Admitting: Urology

## 2018-09-02 VITALS — BP 150/77 | HR 80 | Ht 65.0 in | Wt 159.8 lb

## 2018-09-02 DIAGNOSIS — R310 Gross hematuria: Secondary | ICD-10-CM

## 2018-09-02 DIAGNOSIS — E854 Organ-limited amyloidosis: Secondary | ICD-10-CM | POA: Diagnosis not present
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
## 2018-09-25 ENCOUNTER — Ambulatory Visit
Admission: EM | Admit: 2018-09-25 | Discharge: 2018-09-25 | Disposition: A | Discharge status: Home / Self Care | Payer: Medicare Other | Attending: Emergency Medicine | Admitting: Emergency Medicine

## 2018-09-25 ENCOUNTER — Other Ambulatory Visit: Payer: Self-pay

## 2018-09-25 ENCOUNTER — Ambulatory Visit (INDEPENDENT_AMBULATORY_CARE_PROVIDER_SITE_OTHER): Payer: Medicare Other

## 2018-09-25 DIAGNOSIS — S61451A Open bite of right hand, initial encounter: Secondary | ICD-10-CM

## 2018-09-25 DIAGNOSIS — W540XXA Bitten by dog, initial encounter: Secondary | ICD-10-CM | POA: Diagnosis not present

## 2018-09-25 MED ORDER — AMOXICILLIN-POT CLAVULANATE 875-125 MG PO TABS: 1.0000 | ORAL_TABLET | Freq: Two times a day (BID) | ORAL | 0 refills | Status: AC

## 2018-09-25 MED ORDER — CHLORHEXIDINE GLUCONATE 4 % EX LIQD: Freq: Every day | CUTANEOUS | 0 refills | Status: DC | PRN

## 2018-09-25 MED ORDER — CEFTRIAXONE SODIUM 1 G IJ SOLR
1.0000 g | Freq: Once | INTRAMUSCULAR | Status: AC
  Administered 2018-09-25: 1 g via INTRAMUSCULAR

## 2018-09-25 NOTE — ED Triage Notes (Addendum)
Patient states that she was at the vet with her dog. States that she was trying to restrain dog to let vet look in ears. States that dog nipped at her and bit her right thumb. Patient states that dog is current on vaccinations.  States that this happened yesterday morning around 10 am.
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
## 2018-09-25 NOTE — Discharge Instructions (Signed)
Keep this clean with Hibiclens soaks.  Gram of Tylenol 3 or 4 times a day as needed for pain.  Bacitracin on the lacerations as needed.  Finish the Augmentin, even if you feel better.  Follow-up with Dr. Peggye Ley if you are not getting any better, and go immediately to the ER for the signs and symptoms we discussed.

## 2018-09-28 ENCOUNTER — Ambulatory Visit: Payer: Medicare Other | Admitting: Internal Medicine

## 2018-10-18 ENCOUNTER — Encounter: Payer: Self-pay | Admitting: Internal Medicine

## 2018-11-17 DIAGNOSIS — G629 Polyneuropathy, unspecified: Secondary | ICD-10-CM | POA: Insufficient documentation

## 2019-01-19 ENCOUNTER — Ambulatory Visit (INDEPENDENT_AMBULATORY_CARE_PROVIDER_SITE_OTHER): Payer: Medicare Other | Admitting: Internal Medicine

## 2019-01-19 ENCOUNTER — Encounter: Payer: Self-pay | Admitting: Internal Medicine

## 2019-01-19 ENCOUNTER — Other Ambulatory Visit: Payer: Self-pay

## 2019-01-19 VITALS — BP 142/82 | HR 81 | Ht 65.0 in | Wt 150.0 lb

## 2019-01-19 DIAGNOSIS — R634 Abnormal weight loss: Secondary | ICD-10-CM

## 2019-01-19 DIAGNOSIS — R05 Cough: Secondary | ICD-10-CM

## 2019-01-19 DIAGNOSIS — F39 Unspecified mood [affective] disorder: Secondary | ICD-10-CM

## 2019-01-19 DIAGNOSIS — I10 Essential (primary) hypertension: Secondary | ICD-10-CM

## 2019-01-19 DIAGNOSIS — R058 Other specified cough: Secondary | ICD-10-CM

## 2019-01-19 DIAGNOSIS — G2581 Restless legs syndrome: Secondary | ICD-10-CM | POA: Diagnosis not present

## 2019-01-19 MED ORDER — MIRTAZAPINE 15 MG PO TABS: 15.0000 mg | ORAL_TABLET | Freq: Every day | ORAL | 1 refills | Status: DC

## 2019-01-19 MED ORDER — ROPINIROLE HCL 0.25 MG PO TABS: 0.2500 mg | ORAL_TABLET | Freq: Every evening | ORAL | 5 refills | Status: DC

## 2019-01-19 NOTE — Progress Notes (Signed)
Date:  01/19/2019   Name:  Kelly Ritter   DOB:  11/15/1933   MRN:  [id number]   Chief Complaint: Weight Loss; Fatigue (Feels sluggish and has no energy. Tired all the time. Neds Rf on celexa and requip. Been out of celexa for over a month.); and Depression (12-PHQ9 )  Depression         This is a chronic problem.  The problem occurs daily.  Associated symptoms include fatigue, appetite change and sad.  Associated symptoms include no headaches and no suicidal ideas.     Exacerbated by: recent passing of her husband.  Past treatments include SSRIs - Selective serotonin reuptake inhibitors (ran out of Celexa about 3-4 weeks ago).  Previous treatment provided moderate relief. Shortness of Breath  This is a recurrent problem. The problem occurs intermittently. The problem has been unchanged. Pertinent negatives include no chest pain, claudication, fever, headaches, leg swelling, orthopnea, sore throat, sputum production, syncope, vomiting or wheezing. The symptoms are aggravated by exercise. The patient has no known risk factors for DVT/PE. Her past medical history is significant for CAD.  Cough  This is a recurrent problem. The problem occurs every few minutes. The cough is non-productive. Associated symptoms include shortness of breath. Pertinent negatives include no chest pain, chills, fever, headaches, nasal congestion, postnasal drip, sore throat or wheezing. Nothing aggravates the symptoms. Improvement on treatment: she wonders if the lisinopril could be contributing. There is no history of environmental allergies.  Weight loss - she has lost about 7 lbs over the past few months.  She has a poor appetite - she thinks she wants to eat but can only take a few bites.  She is drinking sufficient liquids.   RLS - continues on Requip nightly with good benefit.  Review of Systems  Constitutional: Positive for appetite change, fatigue and unexpected weight change. Negative for chills and fever.  HENT:  Negative for postnasal drip, sore throat and trouble swallowing.   Eyes: Negative for visual disturbance.  Respiratory: Positive for cough and shortness of breath. Negative for sputum production, chest tightness and wheezing.   Cardiovascular: Negative for chest pain, palpitations, orthopnea, claudication, leg swelling and syncope.  Gastrointestinal: Negative for diarrhea, nausea and vomiting.  Musculoskeletal: Positive for arthralgias.  Allergic/Immunologic: Negative for environmental allergies.  Neurological: Positive for light-headedness. Negative for dizziness, syncope, weakness and headaches.  Hematological: Bruises/bleeds easily.  Psychiatric/Behavioral: Positive for depression and dysphoric mood. Negative for sleep disturbance and suicidal ideas. The patient is not nervous/anxious.     Patient Active Problem List   Diagnosis Date Noted  . 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 HLD (hyperlipidemia) 07/26/2018  . GERD (gastroesophageal reflux disease) 07/26/2018  . Abdominal pain 07/26/2018  . Pyelonephritis 06/19/2018  . Osteoporosis of forearm 04/02/2018  . Closed fracture of lateral malleolus 01/13/2018  . B12 deficiency 08/14/2017  . Prediabetes 08/13/2017  . Coronary artery disease 05/06/2017  . Pure hypercholesterolemia 05/06/2017  . HTN (hypertension) 05/06/2017  . SOBOE (shortness of breath on exertion) 02/26/2017  . Anxiety, generalized 02/03/2017  . Post-concussion headache 02/03/2017  . Elevated TSH 10/22/2016  . Hematuria, microscopic 10/22/2016  . Primary osteoarthritis of hand 01/09/2016  . Arthralgia of multiple sites 12/18/2015  . Chronic hand pain, right 12/18/2015  . Encounter for long-term (current) use of high-risk medication 12/18/2015  . RSD (reflex sympathetic dystrophy) 12/18/2015  . Chest pain 09/24/2015  . Personal history of other malignant neoplasm of skin 08/02/2015  . Benign essential hypertension  01/06/2015  . Carotid artery  obstruction 12/19/2014  . CVA (cerebral infarction) 12/12/2014  . Bilateral carotid artery stenosis 10/21/2014  . Other allergic rhinitis 10/21/2014  . Paroxysmal supraventricular tachycardia ([place]) 07/05/2014    Allergies  Allergen Reactions  . Sulfa Antibiotics Hives, Itching, Swelling and Rash  . Baclofen Other (See Comments)    Muscle spasms and cramps in shoulder area  . Latex Rash, Hives and Other (See Comments)    Skin eruptions    . Donepezil Other (See Comments)    Crazy feeling    Past Surgical History:  Procedure Laterality Date  . ABDOMINAL HYSTERECTOMY  1979  . APPENDECTOMY  1956  . CARDIAC CATHETERIZATION N/A 09/25/2015   Procedure: Left Heart Cath and Coronary Angiography;  Surgeon: Teodoro Spray, MD;  Location: [place] [facility];  Service: Cardiovascular;  Laterality: N/A;  . CAROTID STENT Left 2014   patient has currently 7 stents in her heart and 1 in left carotid.  Marland Kitchen COLONOSCOPY     removed polyps  . CYSTOSCOPY W/ URETERAL STENT PLACEMENT Left 07/20/2018   Procedure: CYSTOSCOPY WITH RETROGRADE PYELOGRAM/URETERAL STENT Exchange;  Surgeon: Hollice Espy, MD;  Location: [facility];  Service: Urology;  Laterality: Left;  . CYSTOSCOPY WITH STENT PLACEMENT Left 06/21/2018   Procedure: CYSTOSCOPY WITH STENT PLACEMENT;  Surgeon: Abbie Sons, MD;  Location: [facility];  Service: Urology;  Laterality: Left;  . EYE SURGERY  2010   cataracts  . JOINT REPLACEMENT Right 1995   knee replacement  . PERIPHERAL VASCULAR CATHETERIZATION Left 12/19/2014   Procedure: Carotid PTA/Stent Intervention;  Surgeon: Algernon Huxley, MD;  Location: [place] [facility];  Service: Cardiovascular;  Laterality: Left;  . Scrambler Therapy     for neuropathic pain  . URETERAL BIOPSY N/A 07/20/2018   Procedure: bladder biopsy ;  Surgeon: Hollice Espy, MD;  Location: [facility];  Service: Urology;  Laterality: N/A;    Social History   Tobacco Use  . Smoking status: Former Smoker     Packs/day: 1.00    Years: 8.00    Pack years: 8.00    Types: Cigarettes    Last attempt to quit: 08/12/1989    Years since quitting: 29.4  . Smokeless tobacco: Never Used  . Tobacco comment: smoking cessation materials not required  Substance Use Topics  . Alcohol use: Not Currently    Alcohol/week: 0.0 standard drinks  . Drug use: Never     Medication list has been reviewed and updated.  Current Meds  Medication Sig  . acetaminophen (TYLENOL) 650 MG CR tablet Take 1,300 mg by mouth at bedtime as needed for pain.   . chlorhexidine (HIBICLENS) 4 % external liquid Apply topically daily as needed. Dilute 10-15 mL in water, Use daily when bathing for 1-2 weeks  . clopidogrel (PLAVIX) 75 MG tablet Take 75 mg by mouth at bedtime.   . Cyanocobalamin (B-12) 500 MCG TABS Take 1 tablet by mouth daily. (Patient taking differently: Take 500 mcg by mouth daily. )  . docusate sodium (COLACE) 100 MG capsule Take 1 capsule (100 mg total) by mouth 2 (two) times daily.  Marland Kitchen lisinopril (PRINIVIL,ZESTRIL) 20 MG tablet Take 20 mg by mouth every evening.  . nebivolol (BYSTOLIC) 5 MG tablet Take 5 mg by mouth daily.   . polyethylene glycol (MIRALAX / GLYCOLAX) packet Take 17 g by mouth daily.  . potassium gluconate 595 MG TABS tablet Take 595 mg by mouth at bedtime as needed (leg cramps).  Marland Kitchen  rOPINIRole (REQUIP) 0.25 MG tablet Take 1 tablet (0.25 mg total) by mouth every evening.  . rosuvastatin (CRESTOR) 20 MG tablet Take 20 mg by mouth at bedtime.    PHQ 2/9 Scores 01/19/2019 09/01/2018 03/18/2018 08/13/2017  PHQ - 2 Score 3 0 0 0  PHQ- 9 Score 12 - 0 3    BP Readings from Last 3 Encounters:  01/19/19 (!) 142/82  09/25/18 (!) 158/70  09/02/18 (!) 150/77    Physical Exam Constitutional:      Appearance: Normal appearance.  Eyes:     Pupils: Pupils are equal, round, and reactive to light.  Neck:     Musculoskeletal: Normal range of motion.  Cardiovascular:     Rate and Rhythm: Normal rate and regular  rhythm.     Pulses: Normal pulses.     Heart sounds: No murmur.  Pulmonary:     Effort: Pulmonary effort is normal.     Breath sounds: No wheezing or rhonchi.  Musculoskeletal:     Right lower leg: No edema.     Left lower leg: No edema.  Lymphadenopathy:     Cervical: No cervical adenopathy.  Skin:    Capillary Refill: Capillary refill takes less than 2 seconds.     Findings: Bruising present.  Neurological:     Mental Status: She is alert and oriented to person, place, and time.  Psychiatric:        Attention and Perception: Attention and perception normal.        Mood and Affect: Affect is tearful.        Judgment: Judgment normal.     Wt Readings from Last 3 Encounters:  01/19/19 150 lb (68 kg)  09/25/18 157 lb (71.2 kg)  09/02/18 159 lb 12.8 oz (72.5 kg)    BP (!) 142/82   Pulse 81   Ht 5\' 5"  (1.651 m)   Wt 150 lb (68 kg)   SpO2 99%   BMI 24.96 kg/m   Assessment and Plan: 1. Mood disorder ([place]) Remain off of Celexa Try mirtazapine to help with sleep, mood and appetits - mirtazapine (REMERON) 15 MG tablet; Take 1 tablet (15 mg total) by mouth at bedtime.  Dispense: 30 tablet; Refill: 1  2. Weight loss, unintentional Check labs - TSH + free T4 - Comprehensive metabolic panel  3. Cough due to ACE inhibitor Hold lisinopril for one week - if improved, she will call for alternative therapy - CBC with Differential/Platelet  4. Restless legs syndrome - rOPINIRole (REQUIP) 0.25 MG tablet; Take 1 tablet (0.25 mg total) by mouth every evening.  Dispense: 30 tablet; Refill: 5  5. Essential hypertension Fair control - Comprehensive metabolic panel   Partially dictated using Editor, commissioning. Any errors are unintentional.  Halina Maidens, MD [facility]  01/19/2019

## 2019-01-19 NOTE — Patient Instructions (Signed)
Stop Celexa - take Remeron instead at bedtime  Stop Lisinopril for one week.  Monitor the cough and shortness of breath and if improved, call me for an alternative blood pressure medication

## 2019-01-20 LAB — CBC WITH DIFFERENTIAL/PLATELET
Basophils Absolute: 0.1 10*3/uL (ref 0.0–0.2)
Basos: 1 %
EOS (ABSOLUTE): 0.1 10*3/uL (ref 0.0–0.4)
Eos: 2 %
Hematocrit: 39.1 % (ref 34.0–46.6)
Hemoglobin: 13.4 g/dL (ref 11.1–15.9)
Immature Grans (Abs): 0 10*3/uL (ref 0.0–0.1)
Immature Granulocytes: 0 %
Lymphocytes Absolute: 1.9 10*3/uL (ref 0.7–3.1)
Lymphs: 31 %
MCH: 31.1 pg (ref 26.6–33.0)
MCHC: 34.3 g/dL (ref 31.5–35.7)
MCV: 91 fL (ref 79–97)
Monocytes Absolute: 0.5 10*3/uL (ref 0.1–0.9)
Monocytes: 8 %
Neutrophils Absolute: 3.4 10*3/uL (ref 1.4–7.0)
Neutrophils: 58 %
Platelets: 226 10*3/uL (ref 150–450)
RBC: 4.31 x10E6/uL (ref 3.77–5.28)
RDW: 12.7 % (ref 11.7–15.4)
WBC: 6 10*3/uL (ref 3.4–10.8)

## 2019-01-20 LAB — TSH+FREE T4
Free T4: 1.08 ng/dL (ref 0.82–1.77)
TSH: 4.81 u[IU]/mL — ABNORMAL HIGH (ref 0.450–4.500)

## 2019-01-20 LAB — COMPREHENSIVE METABOLIC PANEL
ALT: 12 IU/L (ref 0–32)
AST: 16 IU/L (ref 0–40)
Albumin/Globulin Ratio: 2.5 — ABNORMAL HIGH (ref 1.2–2.2)
Albumin: 5 g/dL — ABNORMAL HIGH (ref 3.6–4.6)
Alkaline Phosphatase: 118 IU/L — ABNORMAL HIGH (ref 39–117)
BUN/Creatinine Ratio: 19 (ref 12–28)
BUN: 16 mg/dL (ref 8–27)
Bilirubin Total: 0.5 mg/dL (ref 0.0–1.2)
CO2: 23 mmol/L (ref 20–29)
Calcium: 9.9 mg/dL (ref 8.7–10.3)
Chloride: 104 mmol/L (ref 96–106)
Creatinine, Ser: 0.84 mg/dL (ref 0.57–1.00)
GFR calc Af Amer: 75 mL/min/{1.73_m2} (ref >59)
GFR calc non Af Amer: 65 mL/min/{1.73_m2} (ref >59)
Globulin, Total: 2 g/dL (ref 1.5–4.5)
Glucose: 96 mg/dL (ref 65–99)
Potassium: 4.8 mmol/L (ref 3.5–5.2)
Sodium: 142 mmol/L (ref 134–144)
Total Protein: 7 g/dL (ref 6.0–8.5)

## 2019-02-19 ENCOUNTER — Encounter: Payer: Self-pay | Admitting: Internal Medicine

## 2019-02-19 ENCOUNTER — Other Ambulatory Visit: Payer: Self-pay

## 2019-02-19 ENCOUNTER — Ambulatory Visit: Payer: Medicare Other | Admitting: Internal Medicine

## 2019-02-19 VITALS — BP 114/68 | HR 87 | Ht 65.0 in | Wt 152.0 lb

## 2019-02-19 DIAGNOSIS — I10 Essential (primary) hypertension: Secondary | ICD-10-CM | POA: Diagnosis not present

## 2019-02-19 DIAGNOSIS — G2581 Restless legs syndrome: Secondary | ICD-10-CM | POA: Diagnosis not present

## 2019-02-19 DIAGNOSIS — F39 Unspecified mood [affective] disorder: Secondary | ICD-10-CM | POA: Diagnosis not present

## 2019-02-19 DIAGNOSIS — R1310 Dysphagia, unspecified: Secondary | ICD-10-CM

## 2019-02-19 DIAGNOSIS — E8582 Wild-type transthyretin-related (ATTR) amyloidosis: Secondary | ICD-10-CM

## 2019-02-19 MED ORDER — LISINOPRIL 10 MG PO TABS: 10.0000 mg | ORAL_TABLET | Freq: Two times a day (BID) | ORAL | 1 refills | Status: DC

## 2019-02-19 NOTE — Patient Instructions (Signed)
Remain off Mirtazepine and Celexa  Change lisinopril to 10 mg twice a day (about 12 hours apart) in place of 20 mg once a day  May also increase Requip to 0.5 mg at bedtime (if symptoms of restless leg are not controlled on 0.25 mg)

## 2019-02-19 NOTE — Progress Notes (Signed)
Date:  02/19/2019   Name:  Kelly Ritter   DOB:  1934-07-11   MRN:  [id number]   Chief Complaint: Depression (Follow up. PHQ9- 11 ) and Hypertension (Follow up.)  Hypertension This is a chronic problem. Pertinent negatives include no headaches, palpitations or shortness of breath. Treatments tried: ACE stopped last visit due to cough - pt never follow through with phone call.  Depression        This is a chronic problem.  The problem occurs daily.  Associated symptoms include insomnia, decreased interest, appetite change and sad.  Associated symptoms include no fatigue and no headaches.     The symptoms are aggravated by family issues.  Past treatments include SSRIs - Selective serotonin reuptake inhibitors (stopped Celexa; started Remeron last visit). RLS - sometimes has to take an extra requip to control her sx.  Sleep is not restful at this time.  Remeron made her insomnia worse. Amyloidosis - recent video visit with Hematology, discussed starting therapy but has to have clearance and testing by GI and Neurology first. Dysphagia - she has intermittent trouble swallowing pills and food.  Has had to spit up food on several occasions.  The problem does not occur with liquids.   Review of Systems  Constitutional: Positive for appetite change. Negative for fatigue and unexpected weight change.  Respiratory: Negative for chest tightness and shortness of breath.   Cardiovascular: Negative for palpitations and leg swelling.  Gastrointestinal: Negative for constipation and diarrhea.  Neurological: Positive for weakness and light-headedness. Negative for dizziness, numbness and headaches.  Psychiatric/Behavioral: Positive for depression. The patient has insomnia.     Patient Active Problem List   Diagnosis Date Noted  . Restless legs syndrome 01/19/2019  . Mood disorder ([place]) 01/19/2019  . 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 HLD (hyperlipidemia) 07/26/2018  . GERD  (gastroesophageal reflux disease) 07/26/2018  . Abdominal pain 07/26/2018  . Pyelonephritis 06/19/2018  . Osteoporosis of forearm 04/02/2018  . Closed fracture of lateral malleolus 01/13/2018  . B12 deficiency 08/14/2017  . Prediabetes 08/13/2017  . Coronary artery disease 05/06/2017  . Pure hypercholesterolemia 05/06/2017  . HTN (hypertension) 05/06/2017  . SOBOE (shortness of breath on exertion) 02/26/2017  . Anxiety, generalized 02/03/2017  . Post-concussion headache 02/03/2017  . Elevated TSH 10/22/2016  . Hematuria, microscopic 10/22/2016  . Primary osteoarthritis of hand 01/09/2016  . Arthralgia of multiple sites 12/18/2015  . Chronic hand pain, right 12/18/2015  . Encounter for long-term (current) use of high-risk medication 12/18/2015  . RSD (reflex sympathetic dystrophy) 12/18/2015  . Chest pain 09/24/2015  . Personal history of other malignant neoplasm of skin 08/02/2015  . Benign essential hypertension 01/06/2015  . 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 CVA (cerebral infarction) 12/12/2014  . Bilateral carotid artery stenosis 10/21/2014  . Other allergic rhinitis 10/21/2014  . Paroxysmal supraventricular tachycardia ([place]) 07/05/2014    Allergies  Allergen Reactions  . Sulfa Antibiotics Hives, Itching, Swelling and Rash  . Baclofen Other (See Comments)    Muscle spasms and cramps in shoulder area  . Latex Rash, Hives and Other (See Comments)    Skin eruptions    . Donepezil Other (See Comments)    Crazy feeling    Past Surgical History:  Procedure Laterality Date  . ABDOMINAL HYSTERECTOMY  1979  . APPENDECTOMY  1956  . CARDIAC CATHETERIZATION N/A 09/25/2015   Procedure: Left Heart Cath and Coronary Angiography;  Surgeon: Teodoro Spray, MD;  Location: [facility];  Service: Cardiovascular;  Laterality: N/A;  . CAROTID STENT Left 2014   patient has currently 7 stents in her heart and 1 in left carotid.  Marland Kitchen COLONOSCOPY     removed polyps  .  CYSTOSCOPY W/ URETERAL STENT PLACEMENT Left 07/20/2018   Procedure: CYSTOSCOPY WITH RETROGRADE PYELOGRAM/URETERAL STENT Exchange;  Surgeon: Hollice Espy, MD;  Location: [facility];  Service: Urology;  Laterality: Left;  . CYSTOSCOPY WITH STENT PLACEMENT Left 06/21/2018   Procedure: CYSTOSCOPY WITH STENT PLACEMENT;  Surgeon: Abbie Sons, MD;  Location: [facility];  Service: Urology;  Laterality: Left;  . EYE SURGERY  2010   cataracts  . JOINT REPLACEMENT Right 1995   knee replacement  . PERIPHERAL VASCULAR CATHETERIZATION Left 12/19/2014   Procedure: Carotid PTA/Stent Intervention;  Surgeon: Algernon Huxley, MD;  Location: [facility];  Service: Cardiovascular;  Laterality: Left;  . Scrambler Therapy     for neuropathic pain  . URETERAL BIOPSY N/A 07/20/2018   Procedure: bladder biopsy ;  Surgeon: Hollice Espy, MD;  Location: [facility];  Service: Urology;  Laterality: N/A;    Social History   Tobacco Use  . Smoking status: Former Smoker    Packs/day: 1.00    Years: 8.00    Pack years: 8.00    Types: Cigarettes    Quit date: 08/12/1989    Years since quitting: 29.5  . Smokeless tobacco: Never Used  . Tobacco comment: smoking cessation materials not required  Substance Use Topics  . Alcohol use: Not Currently    Alcohol/week: 0.0 standard drinks  . Drug use: Never     Medication list has been reviewed and updated.  Current Meds  Medication Sig  . acetaminophen (TYLENOL) 650 MG CR tablet Take 1,300 mg by mouth at bedtime as needed for pain.   . citalopram (CELEXA) 10 MG tablet Take 10 mg by mouth daily.  . clopidogrel (PLAVIX) 75 MG tablet Take 75 mg by mouth at bedtime.   . Cyanocobalamin (B-12) 500 MCG TABS Take 1 tablet by mouth daily. (Patient taking differently: Take 500 mcg by mouth daily. )  . docusate sodium (COLACE) 100 MG capsule Take 1 capsule (100 mg total) by mouth 2 (two) times daily.  Marland Kitchen lisinopril (PRINIVIL,ZESTRIL) 20 MG tablet Take 20 mg by mouth every  evening.  . mirtazapine (REMERON) 15 MG tablet Take 1 tablet (15 mg total) by mouth at bedtime.  . nebivolol (BYSTOLIC) 5 MG tablet Take 5 mg by mouth daily.   Marland Kitchen oxymetazoline (AFRIN) 0.05 % nasal spray Place 1 spray into both nostrils at bedtime as needed for congestion.   . polyethylene glycol (MIRALAX / GLYCOLAX) packet Take 17 g by mouth daily.  . potassium gluconate 595 MG TABS tablet Take 595 mg by mouth at bedtime as needed (leg cramps).  Marland Kitchen rOPINIRole (REQUIP) 0.25 MG tablet Take 1 tablet (0.25 mg total) by mouth every evening.  . rosuvastatin (CRESTOR) 20 MG tablet Take 20 mg by mouth at bedtime.    PHQ 2/9 Scores 02/19/2019 01/19/2019 09/01/2018 03/18/2018  PHQ - 2 Score 4 3 0 0  PHQ- 9 Score 11 12 - 0    BP Readings from Last 3 Encounters:  02/19/19 114/68  01/19/19 (!) 142/82  09/25/18 (!) 158/70    Physical Exam Vitals signs and nursing note reviewed.  Constitutional:      General: She is not in acute distress.    Appearance: She is well-developed.  HENT:     Head: Normocephalic  and atraumatic.  Neck:     Musculoskeletal: Normal range of motion.  Cardiovascular:     Rate and Rhythm: Normal rate and regular rhythm.     Pulses: Normal pulses.  Pulmonary:     Effort: Pulmonary effort is normal. No respiratory distress.     Breath sounds: Normal breath sounds. No decreased breath sounds, wheezing or rhonchi.  Musculoskeletal: Normal range of motion.  Lymphadenopathy:     Cervical: No cervical adenopathy.  Skin:    General: Skin is warm and dry.     Capillary Refill: Capillary refill takes less than 2 seconds.     Findings: No rash.     Comments: Several abrasions and small skin tears anterior shins  Neurological:     Mental Status: She is alert and oriented to person, place, and time.  Psychiatric:        Attention and Perception: Attention normal.        Mood and Affect: Mood normal.        Behavior: Behavior normal.        Thought Content: Thought content normal.      Wt Readings from Last 3 Encounters:  02/19/19 152 lb (68.9 kg)  01/19/19 150 lb (68 kg)  09/25/18 157 lb (71.2 kg)    BP 114/68   Pulse 87   Ht 5\' 5"  (1.651 m)   Wt 152 lb (68.9 kg)   SpO2 97%   BMI 25.29 kg/m   Assessment and Plan: 1. Benign essential hypertension Continue bystolic Change lisinopril to 10 mg bid - lisinopril (ZESTRIL) 10 MG tablet; Take 1 tablet (10 mg total) by mouth 2 (two) times a day.  Dispense: 180 tablet; Refill: 1  2. Mood disorder ([place]) Remain off of medication for now  3. Restless legs syndrome May increase requip to 0.5 mg if needed - call for new Rx  4. Dysphagia, unspecified type Seeing GI next month Call back if rapidly worsening  5. 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 Any errors are unintentional.  Halina Maidens, MD [facility]  02/19/2019

## 2019-03-03 ENCOUNTER — Other Ambulatory Visit: Payer: Self-pay

## 2019-03-03 ENCOUNTER — Encounter: Payer: Self-pay | Admitting: Urology

## 2019-03-03 ENCOUNTER — Ambulatory Visit (INDEPENDENT_AMBULATORY_CARE_PROVIDER_SITE_OTHER): Payer: Medicare Other | Admitting: Urology

## 2019-03-03 VITALS — BP 165/80 | HR 85 | Ht 65.0 in | Wt 150.0 lb

## 2019-03-03 DIAGNOSIS — Z87448 Personal history of other diseases of urinary system: Secondary | ICD-10-CM

## 2019-03-03 DIAGNOSIS — E854 Organ-limited amyloidosis: Secondary | ICD-10-CM
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
## 2019-03-04 ENCOUNTER — Encounter: Payer: Self-pay | Admitting: Internal Medicine

## 2019-03-22 ENCOUNTER — Ambulatory Visit (INDEPENDENT_AMBULATORY_CARE_PROVIDER_SITE_OTHER): Payer: Medicare Other

## 2019-03-22 VITALS — Ht 65.0 in | Wt 150.0 lb

## 2019-03-22 DIAGNOSIS — Z0000 Encounter for general adult medical examination without abnormal findings: Secondary | ICD-10-CM

## 2019-03-22 NOTE — Patient Instructions (Signed)
Ms. Kelly Ritter , Thank you for taking time to come for your Medicare Wellness Visit. I appreciate your ongoing commitment to your health goals. Please review the following plan we discussed and let me know if I can assist you in the future.   Screening recommendations/referrals: Colonoscopy: no longer required Mammogram: no longer required Bone Density: done 04/02/18 Recommended yearly ophthalmology/optometry visit for glaucoma screening and checkup Recommended yearly dental visit for hygiene and checkup  Vaccinations: Influenza vaccine: done 07/27/18 Pneumococcal vaccine: done 08/13/17 Tdap vaccine: done 09/16/17 Shingles vaccine: Shingrix discussed. Please contact your pharmacy for coverage information.   Advanced directives: Please bring a copy of your health care power of attorney and living will to the office at your convenience.  Conditions/risks identified: Recommend eating 3 healthy meals per day  Next appointment: Please follow up in one year for your Medicare Annual Wellness visit.     Preventive Care 83 Years and Older, Female Preventive care refers to lifestyle choices and visits with your health care provider that can promote health and wellness. What does preventive care include?  A yearly physical exam. This is also called an annual well check.  Dental exams once or twice a year.  Routine eye exams. Ask your health care provider how often you should have your eyes checked.  Personal lifestyle choices, including:  Daily care of your teeth and gums.  Regular physical activity.  Eating a healthy diet.  Avoiding tobacco and drug use.  Limiting alcohol use.  Practicing safe sex.  Taking low-dose aspirin every day.  Taking vitamin and mineral supplements as recommended by your health care provider. What happens during an annual well check? The services and screenings done by your health care provider during your annual well check will depend on your age, overall  health, lifestyle risk factors, and family history of disease. Counseling  Your health care provider may ask you questions about your:  Alcohol use.  Tobacco use.  Drug use.  Emotional well-being.  Home and relationship well-being.  Sexual activity.  Eating habits.  History of falls.  Memory and ability to understand (cognition).  Work and work Statistician.  Reproductive health. Screening  You may have the following tests or measurements:  Height, weight, and BMI.  Blood pressure.  Lipid and cholesterol levels. These may be checked every 5 years, or more frequently if you are over 56 years old.  Skin check.  Lung cancer screening. You may have this screening every year starting at age 58 if you have a 30-pack-year history of smoking and currently smoke or have quit within the past 15 years.  Fecal occult blood test (FOBT) of the stool. You may have this test every year starting at age 8.  Flexible sigmoidoscopy or colonoscopy. You may have a sigmoidoscopy every 5 years or a colonoscopy every 10 years starting at age 53.  Hepatitis C blood test.  Hepatitis B blood test.  Sexually transmitted disease (STD) testing.  Diabetes screening. This is done by checking your blood sugar (glucose) after you have not eaten for a while (fasting). You may have this done every 1-3 years.  Bone density scan. This is done to screen for osteoporosis. You may have this done starting at age 67.  Mammogram. This may be done every 1-2 years. Talk to your health care provider about how often you should have regular mammograms. Talk with your health care provider about your test results, treatment options, and if necessary, the need for more tests. Vaccines  Your health care provider may recommend certain vaccines, such as:  Influenza vaccine. This is recommended every year.  Tetanus, diphtheria, and acellular pertussis (Tdap, Td) vaccine. You may need a Td booster every 10 years.   Zoster vaccine. You may need this after age 78.  Pneumococcal 13-valent conjugate (PCV13) vaccine. One dose is recommended after age 47.  Pneumococcal polysaccharide (PPSV23) vaccine. One dose is recommended after age 74. Talk to your health care provider about which screenings and vaccines you need and how often you need them. This information is not intended to replace advice given to you by your health care provider. Make sure you discuss any questions you have with your health care provider. Document Released: 08/25/2015 Document Revised: 04/17/2016 Document Reviewed: 05/30/2015 Elsevier Interactive Patient Education  2017 [employer] Prevention in the Home Falls can cause injuries. They can happen to people of all ages. There are many things you can do to make your home safe and to help prevent falls. What can I do on the outside of my home?  Regularly fix the edges of walkways and driveways and fix any cracks.  Remove anything that might make you trip as you walk through a door, such as a raised step or threshold.  Trim any bushes or trees on the path to your home.  Use bright outdoor lighting.  Clear any walking paths of anything that might make someone trip, such as rocks or tools.  Regularly check to see if handrails are loose or broken. Make sure that both sides of any steps have handrails.  Any raised decks and porches should have guardrails on the edges.  Have any leaves, snow, or ice cleared regularly.  Use sand or salt on walking paths during winter.  Clean up any spills in your garage right away. This includes oil or grease spills. What can I do in the bathroom?  Use night lights.  Install grab bars by the toilet and in the tub and shower. Do not use towel bars as grab bars.  Use non-skid mats or decals in the tub or shower.  If you need to sit down in the shower, use a plastic, non-slip stool.  Keep the floor dry. Clean up any water that spills  on the floor as soon as it happens.  Remove soap buildup in the tub or shower regularly.  Attach bath mats securely with double-sided non-slip rug tape.  Do not have throw rugs and other things on the floor that can make you trip. What can I do in the bedroom?  Use night lights.  Make sure that you have a light by your bed that is easy to reach.  Do not use any sheets or blankets that are too big for your bed. They should not hang down onto the floor.  Have a firm chair that has side arms. You can use this for support while you get dressed.  Do not have throw rugs and other things on the floor that can make you trip. What can I do in the kitchen?  Clean up any spills right away.  Avoid walking on wet floors.  Keep items that you use a lot in easy-to-reach places.  If you need to reach something above you, use a strong step stool that has a grab bar.  Keep electrical cords out of the way.  Do not use floor polish or wax that makes floors slippery. If you must use wax, use non-skid floor wax.  Do  not have throw rugs and other things on the floor that can make you trip. What can I do with my stairs?  Do not leave any items on the stairs.  Make sure that there are handrails on both sides of the stairs and use them. Fix handrails that are broken or loose. Make sure that handrails are as long as the stairways.  Check any carpeting to make sure that it is firmly attached to the stairs. Fix any carpet that is loose or worn.  Avoid having throw rugs at the top or bottom of the stairs. If you do have throw rugs, attach them to the floor with carpet tape.  Make sure that you have a light switch at the top of the stairs and the bottom of the stairs. If you do not have them, ask someone to add them for you. What else can I do to help prevent falls?  Wear shoes that:  Do not have high heels.  Have rubber bottoms.  Are comfortable and fit you well.  Are closed at the toe. Do  not wear sandals.  If you use a stepladder:  Make sure that it is fully opened. Do not climb a closed stepladder.  Make sure that both sides of the stepladder are locked into place.  Ask someone to hold it for you, if possible.  Clearly mark and make sure that you can see:  Any grab bars or handrails.  First and last steps.  Where the edge of each step is.  Use tools that help you move around (mobility aids) if they are needed. These include:  Canes.  Walkers.  Scooters.  Crutches.  Turn on the lights when you go into a dark area. Replace any light bulbs as soon as they burn out.  Set up your furniture so you have a clear path. Avoid moving your furniture around.  If any of your floors are uneven, fix them.  If there are any pets around you, be aware of where they are.  Review your medicines with your doctor. Some medicines can make you feel dizzy. This can increase your chance of falling. Ask your doctor what other things that you can do to help prevent falls. This information is not intended to replace advice given to you by your health care provider. Make sure you discuss any questions you have with your health care provider. Document Released: 05/25/2009 Document Revised: 01/04/2016 Document Reviewed: 09/02/2014 Elsevier Interactive Patient Education  2017 [employer].
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
## 2019-05-09 ENCOUNTER — Other Ambulatory Visit: Payer: Self-pay

## 2019-05-09 ENCOUNTER — Ambulatory Visit
Admission: RE | Admit: 2019-05-09 | Discharge: 2019-05-09 | Disposition: A | Discharge status: Home / Self Care | Payer: Medicare Other | Source: Ambulatory Visit | Attending: Emergency Medicine | Admitting: Emergency Medicine

## 2019-05-09 ENCOUNTER — Ambulatory Visit
Admission: EM | Admit: 2019-05-09 | Discharge: 2019-05-09 | Disposition: A | Discharge status: Home / Self Care | Payer: Medicare Other | Attending: Family Medicine | Admitting: Family Medicine

## 2019-05-09 ENCOUNTER — Encounter: Payer: Self-pay | Admitting: Emergency Medicine

## 2019-05-09 ENCOUNTER — Ambulatory Visit (INDEPENDENT_AMBULATORY_CARE_PROVIDER_SITE_OTHER): Payer: Medicare Other

## 2019-05-09 DIAGNOSIS — M25469 Effusion, unspecified knee: Secondary | ICD-10-CM | POA: Diagnosis present

## 2019-05-09 DIAGNOSIS — M25562 Pain in left knee: Secondary | ICD-10-CM

## 2019-05-09 DIAGNOSIS — M7989 Other specified soft tissue disorders: Secondary | ICD-10-CM | POA: Diagnosis not present
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
## 2019-05-09 NOTE — Discharge Instructions (Addendum)
Rest.  Ice.  Tylenol as needed.  Ultrasound today as discussed.  Follow up with your primary care physician this week as needed. Return to Urgent care for new or worsening concerns.

## 2019-05-09 NOTE — ED Triage Notes (Signed)
Patient c/o pain in the back of her left leg and swelling in the back of the left knee that started yesterday.  Patient denies injury or fall.

## 2019-07-19 ENCOUNTER — Telehealth: Payer: Self-pay

## 2019-07-19 NOTE — Telephone Encounter (Signed)
Patient called saying she tested positive for Covid today. Her daughter Nira Conn tested positive 4 days ago. Pt is not feeling well but right now only has a very sore throat.   Told to her to take tylenol as needed for fever or pain. Drink warm or cold fluids ( whatever gives more relief), and stay quarantined. Seek medical care at the ER if she gets worse with any shortness of breath.   She verbalized understanding.

## 2019-08-30 ENCOUNTER — Other Ambulatory Visit: Payer: Self-pay | Admitting: Internal Medicine

## 2019-08-30 DIAGNOSIS — F39 Unspecified mood [affective] disorder: Secondary | ICD-10-CM

## 2019-09-01 ENCOUNTER — Other Ambulatory Visit: Payer: Self-pay

## 2019-09-01 ENCOUNTER — Telehealth: Payer: Self-pay

## 2019-09-01 NOTE — Telephone Encounter (Signed)
I stopped that medication in July when she said it made her insomnia worse.  I did not refill it recently.  Most likely the pharmacy filled based on refills left on the old prescription.

## 2019-09-01 NOTE — Telephone Encounter (Signed)
Patient informed. She said she takes 1/2 a tablet at bedtime and this helps on the nights when she is missing her husband. She only takes it PRN. Dr Army Melia approved and said to continue this and call when she needs a RF.

## 2019-09-01 NOTE — Telephone Encounter (Signed)
Pt called asking if there is a reason or side effects that should keep her from taking her mirtazapine.   She said she received a call from the pharmacy yesterday saying you declined her RF due to side effects of the medication and today she got the call that her RF is ready for pick up.   She just wants to be sure she is safe to take this medication.

## 2019-09-06 ENCOUNTER — Other Ambulatory Visit: Payer: Self-pay

## 2019-09-06 ENCOUNTER — Ambulatory Visit
Admission: RE | Admit: 2019-09-06 | Discharge: 2019-09-06 | Disposition: A | Discharge status: Home / Self Care | Payer: Medicare Other | Source: Ambulatory Visit | Attending: Urology | Admitting: Urology

## 2019-09-06 DIAGNOSIS — E854 Organ-limited amyloidosis: Secondary | ICD-10-CM | POA: Insufficient documentation

## 2019-09-06 DIAGNOSIS — Z87448 Personal history of other diseases of urinary system: Secondary | ICD-10-CM | POA: Diagnosis present

## 2019-09-07 ENCOUNTER — Ambulatory Visit: Payer: Medicare Other

## 2019-09-08 ENCOUNTER — Encounter: Payer: Self-pay | Admitting: Urology

## 2019-09-08 ENCOUNTER — Other Ambulatory Visit: Payer: Self-pay

## 2019-09-08 ENCOUNTER — Ambulatory Visit (INDEPENDENT_AMBULATORY_CARE_PROVIDER_SITE_OTHER): Payer: Medicare Other | Admitting: Urology

## 2019-09-08 VITALS — BP 168/77 | HR 80 | Ht 65.0 in | Wt 154.0 lb

## 2019-09-08 DIAGNOSIS — E854 Organ-limited amyloidosis: Secondary | ICD-10-CM | POA: Diagnosis not present

## 2019-09-08 DIAGNOSIS — R300 Dysuria: Secondary | ICD-10-CM
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
## 2019-09-10 LAB — URINALYSIS, COMPLETE
Bilirubin, UA: NEGATIVE
Glucose, UA: NEGATIVE
Ketones, UA: NEGATIVE
Nitrite, UA: NEGATIVE
Protein,UA: NEGATIVE
RBC, UA: NEGATIVE
Specific Gravity, UA: 1.02 (ref 1.005–1.030)
Urobilinogen, Ur: 0.2 mg/dL (ref 0.2–1.0)
pH, UA: 7 (ref 5.0–7.5)

## 2019-09-10 LAB — MICROSCOPIC EXAMINATION
Bacteria, UA: NONE SEEN
RBC: NONE SEEN /hpf (ref 0–2)

## 2019-09-11 LAB — CULTURE, URINE COMPREHENSIVE

## 2019-12-07 ENCOUNTER — Other Ambulatory Visit: Payer: Self-pay

## 2019-12-07 ENCOUNTER — Encounter: Payer: Self-pay | Admitting: Dermatology

## 2019-12-07 ENCOUNTER — Ambulatory Visit (INDEPENDENT_AMBULATORY_CARE_PROVIDER_SITE_OTHER): Payer: Medicare Other | Admitting: Dermatology

## 2019-12-07 DIAGNOSIS — L570 Actinic keratosis: Secondary | ICD-10-CM

## 2019-12-07 DIAGNOSIS — L821 Other seborrheic keratosis: Secondary | ICD-10-CM

## 2019-12-07 DIAGNOSIS — L0100 Impetigo, unspecified: Secondary | ICD-10-CM

## 2019-12-07 DIAGNOSIS — Z85828 Personal history of other malignant neoplasm of skin: Secondary | ICD-10-CM | POA: Diagnosis not present

## 2019-12-07 DIAGNOSIS — D692 Other nonthrombocytopenic purpura: Secondary | ICD-10-CM

## 2019-12-07 NOTE — Progress Notes (Signed)
   Follow-Up Visit   Subjective  Kelly Ritter is a 86 y.o. female who presents for the following: HX of Scc (Has hx of SCC on R nasal ala EDC 09/07/19, right pretibia below knee 04/04/2016, and R nasal dorsum 2016), Impetigo (Follow up, patient states that the left nasal ala has cleared), and raised spots (Has an area on chest that may be growing some, patient concerned) Patient has systemic amyloidosis -renal, heart  The following portions of the chart were reviewed this encounter and updated as appropriate:     Review of Systems:  No other skin or systemic complaints except as noted in HPI or Assessment and Plan.  Objective  Well appearing patient in no apparent distress; mood and affect are within normal limits.  A focused examination was performed including face, neck, chest and back. Relevant physical exam findings are noted in the Assessment and Plan.  Objective  Right Eyebrow x1 , right nasal dorsum x 2 left nasal dorsum x 1, left temple x 1 (5): Erythematous thin papules/macules with gritty scale.   Objective  Right Ala Nasi: Well healed scar with no evidence of recurrence, treated with EDC last visit 1/21  Objective  Left Ala Nasi: Clear   Assessment & Plan    AK (actinic keratosis) (5) Right Eyebrow x1 , right nasal dorsum x 2 left nasal dorsum x 1, left temple x 1  Cryotherapy today Prior to procedure, discussed risks of blister formation, small wound, skin dyspigmentation, or rare scar following cryotherapy.    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 Observe for recurrence. Call clinic for new or changing lesions.  Recommend regular skin exams, daily broad-spectrum spf 30+ sunscreen use, and photoprotection.     Impetigo Left Ala Nasi  Resolved Mupirocin ointment prn recurrence  Purpura - Violaceous macules and patches - Benign - Related to age, sun damage and/or use of blood thinners - Observe - Can use OTC arnica containing moisturizer such as Dermend Bruise Formula if desired - Call for worsening or other concerns   Seborrheic Keratoses - Stuck-on, waxy, tan-brown papules and plaques  - Discussed benign etiology and prognosis. - Observe - Call for any changes  Return in about 6 months (around 06/07/2020) for TBSE, AK Follow up.  Marene Lenz, CMA, am acting as scribe for Brendolyn Patty, MD .  Documentation: I have reviewed the above documentation for accuracy and completeness, and I agree with the above.  Brendolyn Patty, MD

## 2019-12-07 NOTE — Patient Instructions (Signed)
Cryotherapy Aftercare  . Wash gently with soap and water everyday.   . Apply Vaseline and Band-Aid daily until healed.  Prior to procedure, discussed risks of blister formation, small wound, skin dyspigmentation, or rare scar following cryotherapy.   

## 2019-12-13 ENCOUNTER — Encounter: Payer: Self-pay | Admitting: Internal Medicine

## 2020-03-03 ENCOUNTER — Other Ambulatory Visit: Payer: Self-pay | Admitting: Internal Medicine

## 2020-03-03 DIAGNOSIS — G2581 Restless legs syndrome: Secondary | ICD-10-CM

## 2020-03-03 NOTE — Telephone Encounter (Signed)
Attempted to call patient to schedule CPE appointment- left message to call office for appointment. Courtesy RF given#[phone]

## 2020-03-19 DIAGNOSIS — Z96651 Presence of right artificial knee joint: Secondary | ICD-10-CM | POA: Insufficient documentation

## 2020-03-22 ENCOUNTER — Ambulatory Visit (INDEPENDENT_AMBULATORY_CARE_PROVIDER_SITE_OTHER): Payer: Medicare Other

## 2020-03-22 DIAGNOSIS — Z0000 Encounter for general adult medical examination without abnormal findings: Secondary | ICD-10-CM | POA: Diagnosis not present
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
## 2020-03-22 NOTE — Patient Instructions (Signed)
Kelly Ritter , Thank you for taking time to come for your Medicare Wellness Visit. I appreciate your ongoing commitment to your health goals. Please review the following plan we discussed and let me know if I can assist you in the future.   Screening recommendations/referrals: Colonoscopy: no longer required Mammogram: no longer required Bone Density: no longer required Recommended yearly ophthalmology/optometry visit for glaucoma screening and checkup Recommended yearly dental visit for hygiene and checkup  Vaccinations: Influenza vaccine: postponed Pneumococcal vaccine: done 08/13/17 Tdap vaccine: done 09/16/17 Shingles vaccine: Shingrix discussed. Please contact your pharmacy for coverage information.  Covid-19:discussed  Advanced directives: Please bring a copy of your health care power of attorney and living will to the office at your convenience.  Conditions/risks identified: Recommend drinking 6-8 glasses of water per day  Next appointment: Follow up in one year for your annual wellness visit    Preventive Care 65 Years and Older, Female Preventive care refers to lifestyle choices and visits with your health care provider that can promote health and wellness. What does preventive care include?  A yearly physical exam. This is also called an annual well check.  Dental exams once or twice a year.  Routine eye exams. Ask your health care provider how often you should have your eyes checked.  Personal lifestyle choices, including:  Daily care of your teeth and gums.  Regular physical activity.  Eating a healthy diet.  Avoiding tobacco and drug use.  Limiting alcohol use.  Practicing safe sex.  Taking low-dose aspirin every day.  Taking vitamin and mineral supplements as recommended by your health care provider. What happens during an annual well check? The services and screenings done by your health care provider during your annual well check will depend on your age,  overall health, lifestyle risk factors, and family history of disease. Counseling  Your health care provider may ask you questions about your:  Alcohol use.  Tobacco use.  Drug use.  Emotional well-being.  Home and relationship well-being.  Sexual activity.  Eating habits.  History of falls.  Memory and ability to understand (cognition).  Work and work Statistician.  Reproductive health. Screening  You may have the following tests or measurements:  Height, weight, and BMI.  Blood pressure.  Lipid and cholesterol levels. These may be checked every 5 years, or more frequently if you are over 42 years old.  Skin check.  Lung cancer screening. You may have this screening every year starting at age 33 if you have a 30-pack-year history of smoking and currently smoke or have quit within the past 15 years.  Fecal occult blood test (FOBT) of the stool. You may have this test every year starting at age 16.  Flexible sigmoidoscopy or colonoscopy. You may have a sigmoidoscopy every 5 years or a colonoscopy every 10 years starting at age 36.  Hepatitis C blood test.  Hepatitis B blood test.  Sexually transmitted disease (STD) testing.  Diabetes screening. This is done by checking your blood sugar (glucose) after you have not eaten for a while (fasting). You may have this done every 1-3 years.  Bone density scan. This is done to screen for osteoporosis. You may have this done starting at age 34.  Mammogram. This may be done every 1-2 years. Talk to your health care provider about how often you should have regular mammograms. Talk with your health care provider about your test results, treatment options, and if necessary, the need for more tests. Vaccines  Your  health care provider may recommend certain vaccines, such as:  Influenza vaccine. This is recommended every year.  Tetanus, diphtheria, and acellular pertussis (Tdap, Td) vaccine. You may need a Td booster every 10  years.  Zoster vaccine. You may need this after age 59.  Pneumococcal 13-valent conjugate (PCV13) vaccine. One dose is recommended after age 53.  Pneumococcal polysaccharide (PPSV23) vaccine. One dose is recommended after age 72. Talk to your health care provider about which screenings and vaccines you need and how often you need them. This information is not intended to replace advice given to you by your health care provider. Make sure you discuss any questions you have with your health care provider. Document Released: 08/25/2015 Document Revised: 04/17/2016 Document Reviewed: 05/30/2015 Elsevier Interactive Patient Education  2017 [place] Prevention in the Home Falls can cause injuries. They can happen to people of all ages. There are many things you can do to make your home safe and to help prevent falls. What can I do on the outside of my home?  Regularly fix the edges of walkways and driveways and fix any cracks.  Remove anything that might make you trip as you walk through a door, such as a raised step or threshold.  Trim any bushes or trees on the path to your home.  Use bright outdoor lighting.  Clear any walking paths of anything that might make someone trip, such as rocks or tools.  Regularly check to see if handrails are loose or broken. Make sure that both sides of any steps have handrails.  Any raised decks and porches should have guardrails on the edges.  Have any leaves, snow, or ice cleared regularly.  Use sand or salt on walking paths during winter.  Clean up any spills in your garage right away. This includes oil or grease spills. What can I do in the bathroom?  Use night lights.  Install grab bars by the toilet and in the tub and shower. Do not use towel bars as grab bars.  Use non-skid mats or decals in the tub or shower.  If you need to sit down in the shower, use a plastic, non-slip stool.  Keep the floor dry. Clean up any water that  spills on the floor as soon as it happens.  Remove soap buildup in the tub or shower regularly.  Attach bath mats securely with double-sided non-slip rug tape.  Do not have throw rugs and other things on the floor that can make you trip. What can I do in the bedroom?  Use night lights.  Make sure that you have a light by your bed that is easy to reach.  Do not use any sheets or blankets that are too big for your bed. They should not hang down onto the floor.  Have a firm chair that has side arms. You can use this for support while you get dressed.  Do not have throw rugs and other things on the floor that can make you trip. What can I do in the kitchen?  Clean up any spills right away.  Avoid walking on wet floors.  Keep items that you use a lot in easy-to-reach places.  If you need to reach something above you, use a strong step stool that has a grab bar.  Keep electrical cords out of the way.  Do not use floor polish or wax that makes floors slippery. If you must use wax, use non-skid floor wax.  Do not  have throw rugs and other things on the floor that can make you trip. What can I do with my stairs?  Do not leave any items on the stairs.  Make sure that there are handrails on both sides of the stairs and use them. Fix handrails that are broken or loose. Make sure that handrails are as long as the stairways.  Check any carpeting to make sure that it is firmly attached to the stairs. Fix any carpet that is loose or worn.  Avoid having throw rugs at the top or bottom of the stairs. If you do have throw rugs, attach them to the floor with carpet tape.  Make sure that you have a light switch at the top of the stairs and the bottom of the stairs. If you do not have them, ask someone to add them for you. What else can I do to help prevent falls?  Wear shoes that:  Do not have high heels.  Have rubber bottoms.  Are comfortable and fit you well.  Are closed at the  toe. Do not wear sandals.  If you use a stepladder:  Make sure that it is fully opened. Do not climb a closed stepladder.  Make sure that both sides of the stepladder are locked into place.  Ask someone to hold it for you, if possible.  Clearly mark and make sure that you can see:  Any grab bars or handrails.  First and last steps.  Where the edge of each step is.  Use tools that help you move around (mobility aids) if they are needed. These include:  Canes.  Walkers.  Scooters.  Crutches.  Turn on the lights when you go into a dark area. Replace any light bulbs as soon as they burn out.  Set up your furniture so you have a clear path. Avoid moving your furniture around.  If any of your floors are uneven, fix them.  If there are any pets around you, be aware of where they are.  Review your medicines with your doctor. Some medicines can make you feel dizzy. This can increase your chance of falling. Ask your doctor what other things that you can do to help prevent falls. This information is not intended to replace advice given to you by your health care provider. Make sure you discuss any questions you have with your health care provider. Document Released: 05/25/2009 Document Revised: 01/04/2016 Document Reviewed: 09/02/2014 Elsevier Interactive Patient Education  2017 [employer].

## 2020-03-24 ENCOUNTER — Encounter: Payer: Self-pay | Admitting: Emergency Medicine

## 2020-03-24 ENCOUNTER — Other Ambulatory Visit: Payer: Self-pay

## 2020-03-24 ENCOUNTER — Ambulatory Visit
Admission: EM | Admit: 2020-03-24 | Discharge: 2020-03-24 | Disposition: A | Discharge status: Home / Self Care | Payer: Medicare Other | Attending: Internal Medicine | Admitting: Internal Medicine

## 2020-03-24 DIAGNOSIS — I739 Peripheral vascular disease, unspecified: Secondary | ICD-10-CM | POA: Insufficient documentation

## 2020-03-24 DIAGNOSIS — G5603 Carpal tunnel syndrome, bilateral upper limbs: Secondary | ICD-10-CM | POA: Insufficient documentation

## 2020-03-24 DIAGNOSIS — Z20822 Contact with and (suspected) exposure to covid-19: Secondary | ICD-10-CM | POA: Diagnosis not present

## 2020-03-24 DIAGNOSIS — Z96651 Presence of right artificial knee joint: Secondary | ICD-10-CM | POA: Diagnosis not present

## 2020-03-24 DIAGNOSIS — Z7902 Long term (current) use of antithrombotics/antiplatelets: Secondary | ICD-10-CM | POA: Diagnosis not present

## 2020-03-24 DIAGNOSIS — Z87891 Personal history of nicotine dependence: Secondary | ICD-10-CM | POA: Diagnosis not present

## 2020-03-24 DIAGNOSIS — J208 Acute bronchitis due to other specified organisms: Secondary | ICD-10-CM | POA: Insufficient documentation

## 2020-03-24 DIAGNOSIS — Z882 Allergy status to sulfonamides status: Secondary | ICD-10-CM | POA: Insufficient documentation

## 2020-03-24 DIAGNOSIS — Z8673 Personal history of transient ischemic attack (TIA), and cerebral infarction without residual deficits: Secondary | ICD-10-CM | POA: Diagnosis not present

## 2020-03-24 DIAGNOSIS — F39 Unspecified mood [affective] disorder: Secondary | ICD-10-CM | POA: Insufficient documentation

## 2020-03-24 DIAGNOSIS — I252 Old myocardial infarction: Secondary | ICD-10-CM | POA: Insufficient documentation

## 2020-03-24 DIAGNOSIS — I10 Essential (primary) hypertension: Secondary | ICD-10-CM | POA: Diagnosis not present

## 2020-03-24 DIAGNOSIS — G2581 Restless legs syndrome: Secondary | ICD-10-CM | POA: Diagnosis not present

## 2020-03-24 DIAGNOSIS — Z8249 Family history of ischemic heart disease and other diseases of the circulatory system: Secondary | ICD-10-CM | POA: Insufficient documentation

## 2020-03-24 DIAGNOSIS — M1990 Unspecified osteoarthritis, unspecified site: Secondary | ICD-10-CM | POA: Insufficient documentation

## 2020-03-24 DIAGNOSIS — R7303 Prediabetes: Secondary | ICD-10-CM | POA: Insufficient documentation

## 2020-03-24 DIAGNOSIS — I2510 Atherosclerotic heart disease of native coronary artery without angina pectoris: Secondary | ICD-10-CM | POA: Diagnosis not present

## 2020-03-24 DIAGNOSIS — Z9049 Acquired absence of other specified parts of digestive tract: Secondary | ICD-10-CM | POA: Diagnosis not present

## 2020-03-24 DIAGNOSIS — I471 Supraventricular tachycardia: Secondary | ICD-10-CM | POA: Diagnosis not present

## 2020-03-24 DIAGNOSIS — E8582 Wild-type transthyretin-related (ATTR) amyloidosis: Secondary | ICD-10-CM | POA: Insufficient documentation

## 2020-03-24 DIAGNOSIS — Z955 Presence of coronary angioplasty implant and graft: Secondary | ICD-10-CM | POA: Insufficient documentation

## 2020-03-24 DIAGNOSIS — E538 Deficiency of other specified B group vitamins: Secondary | ICD-10-CM | POA: Insufficient documentation

## 2020-03-24 DIAGNOSIS — K219 Gastro-esophageal reflux disease without esophagitis: Secondary | ICD-10-CM | POA: Diagnosis not present

## 2020-03-24 DIAGNOSIS — Z85828 Personal history of other malignant neoplasm of skin: Secondary | ICD-10-CM | POA: Diagnosis not present

## 2020-03-24 DIAGNOSIS — Z79899 Other long term (current) drug therapy: Secondary | ICD-10-CM | POA: Insufficient documentation

## 2020-03-24 DIAGNOSIS — F419 Anxiety disorder, unspecified: Secondary | ICD-10-CM | POA: Insufficient documentation

## 2020-03-24 DIAGNOSIS — E782 Mixed hyperlipidemia: Secondary | ICD-10-CM | POA: Insufficient documentation

## 2020-03-24 LAB — BASIC METABOLIC PANEL
Anion gap: 9 (ref 5–15)
BUN: 18 mg/dL (ref 8–23)
CO2: 30 mmol/L (ref 22–32)
Calcium: 9.2 mg/dL (ref 8.9–10.3)
Chloride: 98 mmol/L (ref 98–111)
Creatinine, Ser: 1.01 mg/dL — ABNORMAL HIGH (ref 0.44–1.00)
GFR calc Af Amer: 59 mL/min — ABNORMAL LOW (ref >60)
GFR calc non Af Amer: 51 mL/min — ABNORMAL LOW (ref >60)
Glucose, Bld: 107 mg/dL — ABNORMAL HIGH (ref 70–99)
Potassium: 5.5 mmol/L — ABNORMAL HIGH (ref 3.5–5.1)
Sodium: 137 mmol/L (ref 135–145)

## 2020-03-24 MED ORDER — ALBUTEROL SULFATE HFA 108 (90 BASE) MCG/ACT IN AERS
2.0000 | INHALATION_SPRAY | Freq: Four times a day (QID) | RESPIRATORY_TRACT | 0 refills | Status: DC | PRN
Start: 2020-03-24 — End: 2020-08-21

## 2020-03-24 MED ORDER — BENZONATATE 100 MG PO CAPS
100.0000 mg | ORAL_CAPSULE | Freq: Three times a day (TID) | ORAL | 0 refills | Status: DC
Start: 2020-03-24 — End: 2020-08-21

## 2020-03-24 NOTE — ED Triage Notes (Signed)
Patient c/o cough and chest congestion for the past 2 days.  Patient denies fevers.

## 2020-03-25 LAB — SARS CORONAVIRUS 2 (TAT 6-24 HRS): SARS Coronavirus 2: NEGATIVE
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
## 2020-03-31 ENCOUNTER — Telehealth: Payer: Self-pay

## 2020-03-31 ENCOUNTER — Ambulatory Visit: Payer: Medicare Other | Admitting: Internal Medicine

## 2020-03-31 NOTE — Telephone Encounter (Signed)
Was told by Dr Army Melia to call and cancel appt for patient today because she seen Annual Wellness nurse on the 11th of this month and she wanted the patient to be aware that she does not know anything about amloydosis ( what she discussed with the annual wellness nurse.)  Informed patient she can come in to discuss any problems she is having but we do not need to see her for an annual check up since she just seen [facility] for all her wellness check up questions.  Patient started to sound upset and said she doesn't need to come in today so I canceled the patients appt. Then she said "the point is- I do not know who to see for this." Patient started crying and while I was talking to her and telling her she can still come see Dr Army Melia for today if needed, she hung up abruptly.   CM

## 2020-04-13 ENCOUNTER — Telehealth: Payer: Self-pay

## 2020-04-13 ENCOUNTER — Other Ambulatory Visit: Payer: Self-pay | Admitting: Internal Medicine

## 2020-04-13 DIAGNOSIS — G2581 Restless legs syndrome: Secondary | ICD-10-CM

## 2020-04-13 NOTE — Telephone Encounter (Signed)
Patient established care with [facility] and is no longer under Dr Gaspar Cola care.  CM

## 2020-04-13 NOTE — Telephone Encounter (Signed)
Requested  medications are  due for refill today yes  Requested medications are on the active medication list yes  Last refill 8/4  Last visit 02/2019  Future visit scheduled No  Notes to clinic Failed protocol of visit within 12 months and no future visit scheduled.

## 2020-05-09 ENCOUNTER — Ambulatory Visit: Payer: Medicare Other | Admitting: Dermatology

## 2020-06-22 DIAGNOSIS — M48062 Spinal stenosis, lumbar region with neurogenic claudication: Secondary | ICD-10-CM | POA: Insufficient documentation

## 2020-06-22 IMAGING — CT CT RENAL STONE PROTOCOL
2 of 4 series · 16 of 46 positions shown, 18 images · non-contrast
Comparison: None.

CLINICAL DATA: Left-sided flank pain with worsening, nausea and
vomiting

EXAM:
CT ABDOMEN AND PELVIS WITHOUT CONTRAST
TECHNIQUE: Multidetector CT imaging of the abdomen and pelvis was performed
following the standard protocol without IV contrast.

[Series 2: stone full standard · axial · 0.73mm/px · z∈[-293,+102]mm · 13 of 87 slices shown, 15 images]
[im 4/87  soft-tissue]
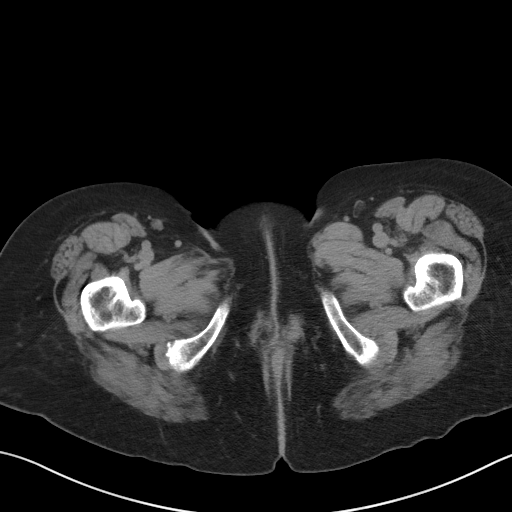
[im 4/87  bone]
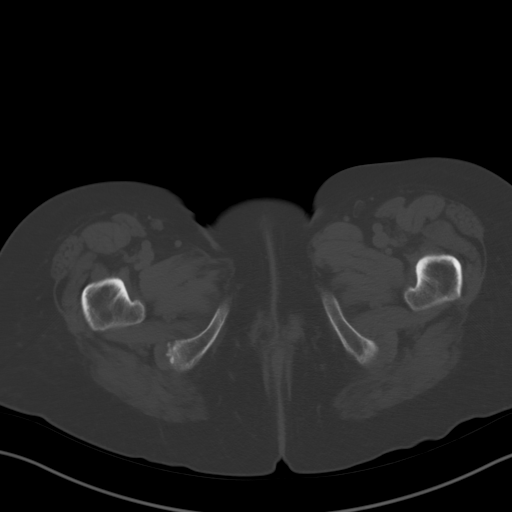
[im 11/87  soft-tissue]
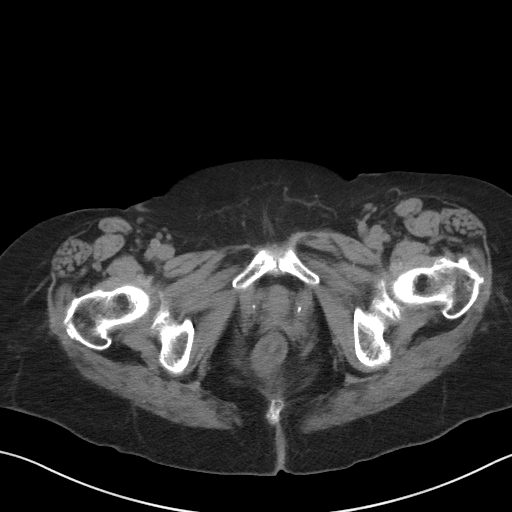
[im 18/87  soft-tissue]
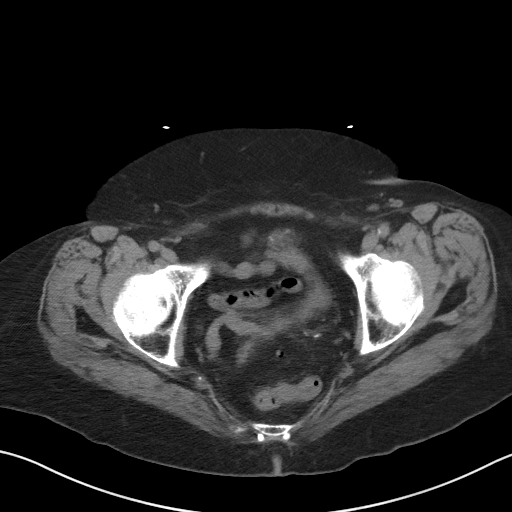
[im 26/87  soft-tissue]
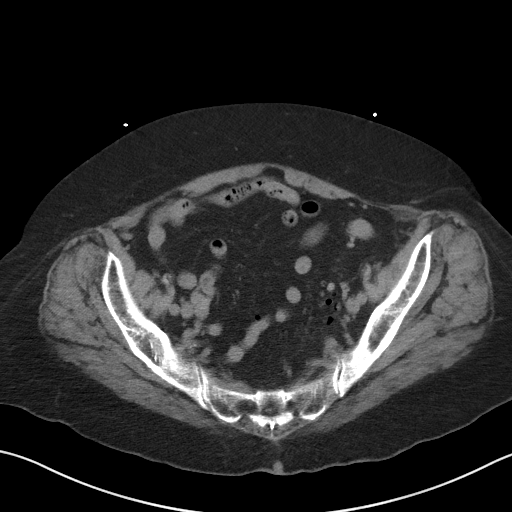
[im 29/87  soft-tissue]
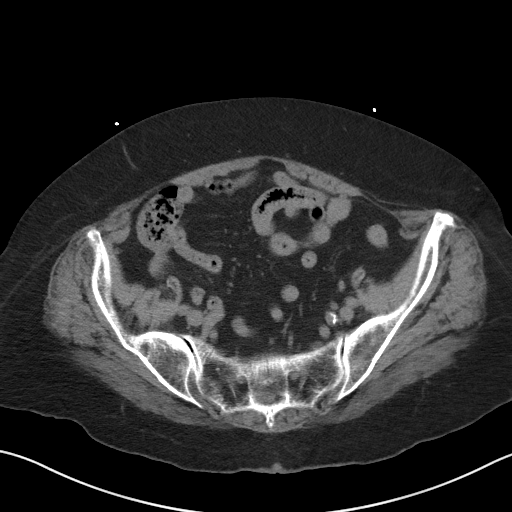
[im 36/87  soft-tissue]
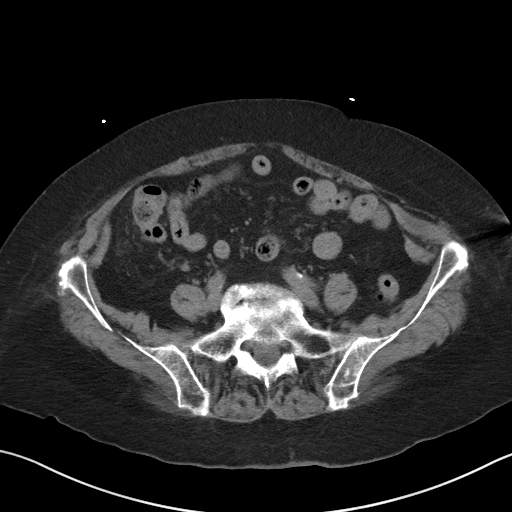
[im 44/87  soft-tissue]
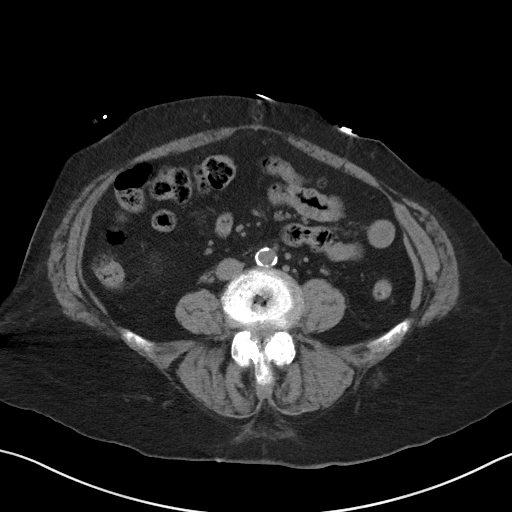
[im 51/87  soft-tissue]
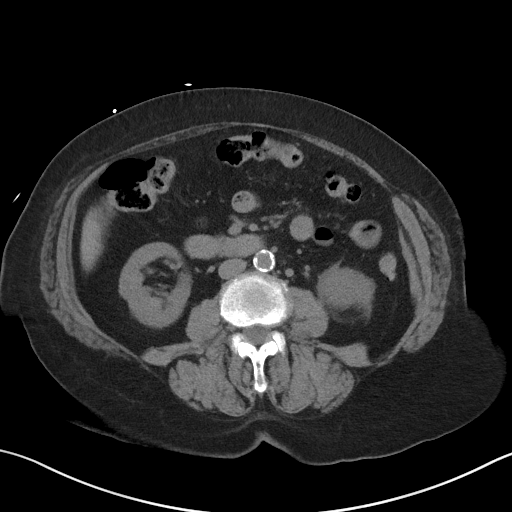
[im 58/87  soft-tissue]
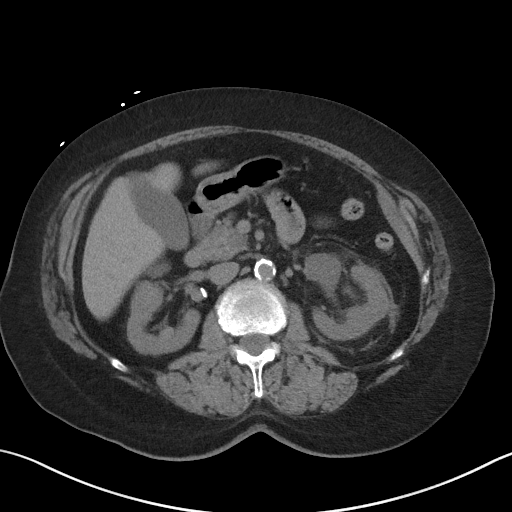
[im 58/87  bone]
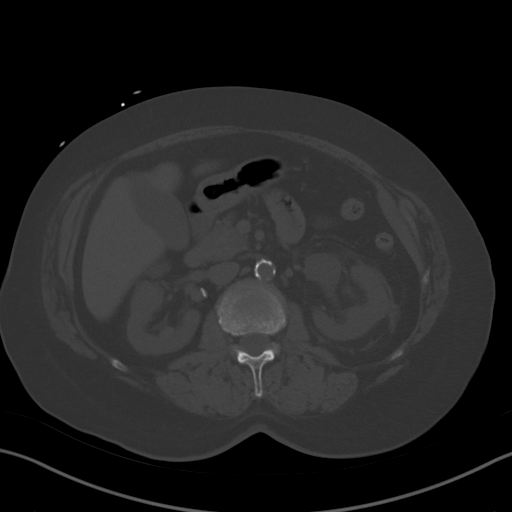
[im 61/87  soft-tissue]
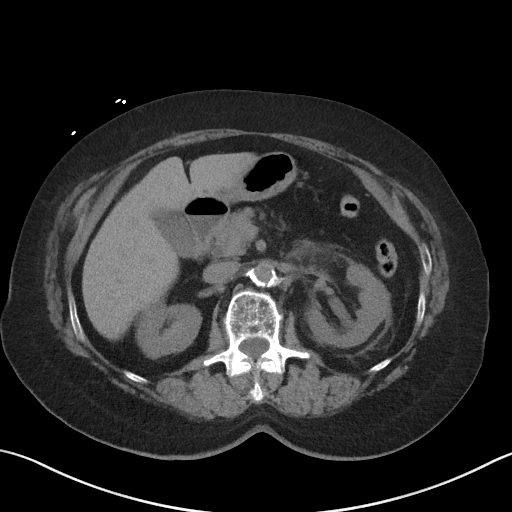
[im 69/87  soft-tissue]
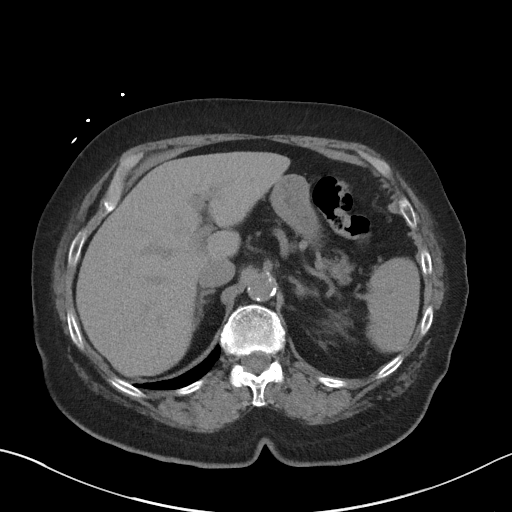
[im 76/87  soft-tissue]
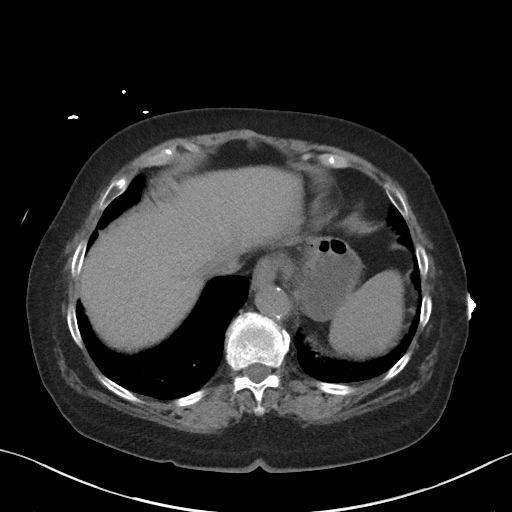
[im 83/87  soft-tissue]
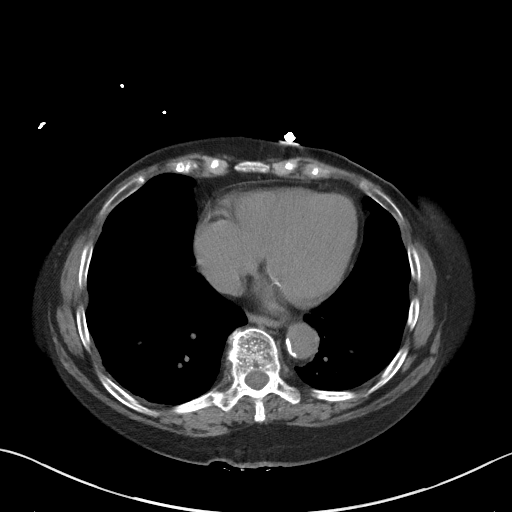

[Series 5: coronal · coronal · 0.70mm/px · 3 of 129 slices shown]
[im 43/129  soft-tissue]
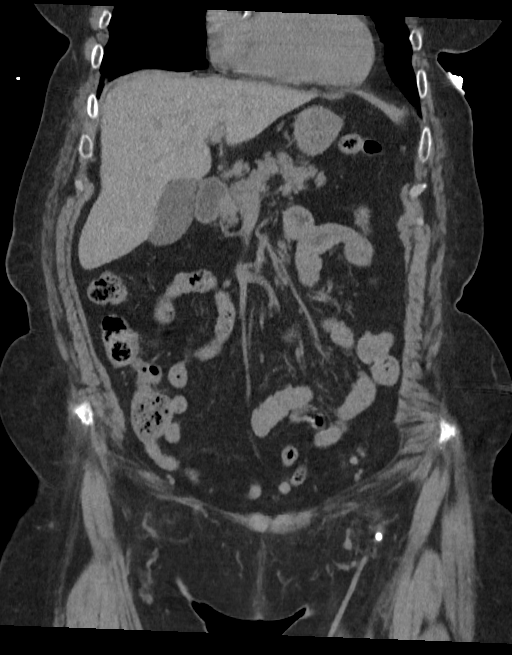
[im 57/129  soft-tissue]
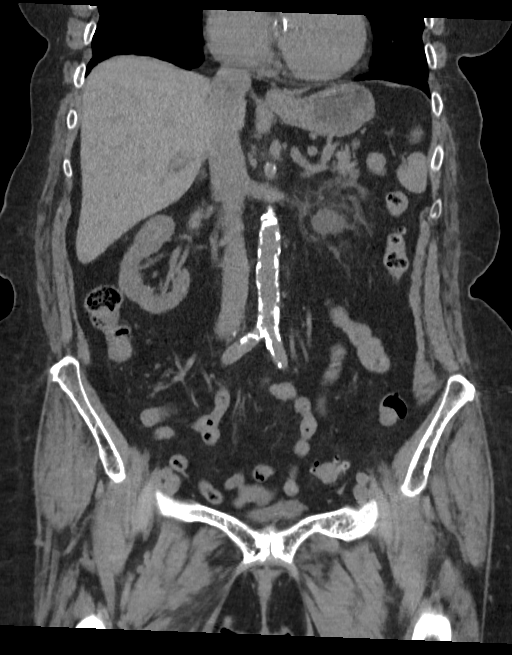
[im 72/129  soft-tissue]
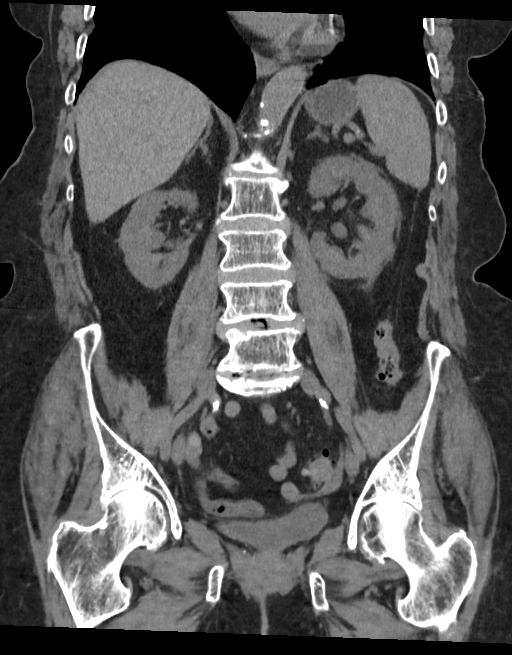

[16 of 46 positions shown; findings below may reference images not displayed]

FINDINGS: Lower chest: Lung bases demonstrate no acute consolidation or
effusion. Coronary vascular calcifications.

Hepatobiliary: No focal liver abnormality is seen. No gallstones,
gallbladder wall thickening, or biliary dilatation.

Pancreas: Unremarkable. No pancreatic ductal dilatation or
surrounding inflammatory changes.

Spleen: Normal in size without focal abnormality.

Adrenals/Urinary Tract: Adrenal glands are within normal limits.
Moderate left perinephric fat stranding. Mild left hydronephrosis.
Left ureter does not appear dilated. No definite ureteral stone.
Bladder normal

Stomach/Bowel: Stomach is nonenlarged. No dilated small bowel.
Sigmoid colon diverticular disease without acute inflammatory
process.

Vascular/Lymphatic: Moderate-to-marked aortic atherosclerosis. No
aneurysmal dilatation. No significant adenopathy

Reproductive: Status post hysterectomy. No adnexal masses.

Other: No free air or free fluid

Musculoskeletal: No acute or suspicious lesion. Degenerative
changes.
IMPRESSION: 1. Mild to moderate left hydronephrosis but without hydroureter. No
ureteral stones are visualized. Findings could be secondary to
recently passed stone. Could consider UPJ configuration with
superimposed pyelonephritis.
2. Sigmoid colon diverticular disease without acute inflammatory
process.

## 2020-08-21 ENCOUNTER — Encounter: Payer: Self-pay | Admitting: Podiatry

## 2020-08-21 ENCOUNTER — Ambulatory Visit (INDEPENDENT_AMBULATORY_CARE_PROVIDER_SITE_OTHER): Payer: Medicare Other | Admitting: Podiatry

## 2020-08-21 ENCOUNTER — Other Ambulatory Visit: Payer: Self-pay

## 2020-08-21 DIAGNOSIS — R52 Pain, unspecified: Secondary | ICD-10-CM | POA: Diagnosis not present

## 2020-08-21 DIAGNOSIS — Z89421 Acquired absence of other right toe(s): Secondary | ICD-10-CM

## 2020-08-21 DIAGNOSIS — L84 Corns and callosities: Secondary | ICD-10-CM

## 2020-08-21 NOTE — Patient Instructions (Signed)
Look for urea 40% cream or ointment and apply to the thickened dry skin / calluses. This can be bought over the counter, at a pharmacy or online such as [employer].  

## 2020-08-22 ENCOUNTER — Encounter: Payer: Self-pay | Admitting: Podiatry

## 2020-08-22 NOTE — Progress Notes (Signed)
  Subjective:  Patient ID: Kelly Ritter, female    DOB: [birth year],  MRN: [id number]  Chief Complaint  Patient presents with  . Callouses    Patient presents today for painful callous bottom of right 5th met x 1 months.  She says "it feels like walking on a rock or glass"    87 y.o. female presents with the above complaint. History confirmed with patient.  She suffered a severe traumatic right foot injury when she was a teenager, nearly had complete degloving and transmetatarsal amputation.  The fifth toe did not survive, the remaining digits were able to be fully re attached.  Since then she has had a painful hard callus under the area where the fifth toe used to be.  A podiatrist previously had made a pad for her that helped significantly.  She tries to put lotion on daily.  Objective:  Physical Exam: warm, good capillary refill, no trophic changes or ulcerative lesions, normal DP and PT pulses and normal sensory exam.  Right Foot: Status post toe imitation of fifth toe, minimal to no range of motion of remaining digits, she has a severe hyperkeratosis and corn sub 5 met head  Assessment:   1. History of amputation of lesser toe of right foot (HCC)   2. Callus of foot   3. Pain      Plan:  Patient was evaluated and treated and all questions answered.   All symptomatic hyperkeratoses were safely debrided with a sterile #15 blade to patient's level of comfort without incident. We discussed preventative and palliative care of these lesions including supportive and accommodative shoegear, padding, prefabricated and custom molded accommodative orthoses, use of a pumice stone and lotions/creams daily.  Recommend she use urea cream daily with a pumice stone.  I fashioned and applied a dancers pad to offload the area.  She will be able to wear this.  We discussed further eventual treatment if this is not helpful such as fifth metatarsal head resection or osteotomy.  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  I will be happy to see her back further if it needs debridement in the future  Return if symptoms worsen or fail to improve.

## 2020-12-21 ENCOUNTER — Inpatient Hospital Stay
Admission: AD | Admit: 2020-12-21 | Discharge: 2020-12-25 | DRG: 519 | Disposition: A | Discharge status: Home Health | Payer: Medicare Other | Source: Ambulatory Visit | Attending: Neurosurgery | Admitting: Neurosurgery

## 2020-12-21 ENCOUNTER — Encounter: Payer: Self-pay | Admitting: Neurosurgery

## 2020-12-21 DIAGNOSIS — Z85828 Personal history of other malignant neoplasm of skin: Secondary | ICD-10-CM | POA: Diagnosis not present

## 2020-12-21 DIAGNOSIS — G8918 Other acute postprocedural pain: Secondary | ICD-10-CM | POA: Diagnosis not present

## 2020-12-21 DIAGNOSIS — M5136 Other intervertebral disc degeneration, lumbar region: Secondary | ICD-10-CM | POA: Diagnosis present

## 2020-12-21 DIAGNOSIS — K219 Gastro-esophageal reflux disease without esophagitis: Secondary | ICD-10-CM | POA: Diagnosis present

## 2020-12-21 DIAGNOSIS — Z91040 Latex allergy status: Secondary | ICD-10-CM

## 2020-12-21 DIAGNOSIS — E854 Organ-limited amyloidosis: Secondary | ICD-10-CM | POA: Diagnosis present

## 2020-12-21 DIAGNOSIS — I200 Unstable angina: Secondary | ICD-10-CM | POA: Diagnosis present

## 2020-12-21 DIAGNOSIS — I43 Cardiomyopathy in diseases classified elsewhere: Secondary | ICD-10-CM | POA: Diagnosis present

## 2020-12-21 DIAGNOSIS — E7800 Pure hypercholesterolemia, unspecified: Secondary | ICD-10-CM | POA: Diagnosis present

## 2020-12-21 DIAGNOSIS — E785 Hyperlipidemia, unspecified: Secondary | ICD-10-CM | POA: Diagnosis present

## 2020-12-21 DIAGNOSIS — Z79899 Other long term (current) drug therapy: Secondary | ICD-10-CM

## 2020-12-21 DIAGNOSIS — Z8673 Personal history of transient ischemic attack (TIA), and cerebral infarction without residual deficits: Secondary | ICD-10-CM

## 2020-12-21 DIAGNOSIS — M4802 Spinal stenosis, cervical region: Secondary | ICD-10-CM | POA: Diagnosis present

## 2020-12-21 DIAGNOSIS — M1990 Unspecified osteoarthritis, unspecified site: Secondary | ICD-10-CM | POA: Diagnosis present

## 2020-12-21 DIAGNOSIS — I209 Angina pectoris, unspecified: Secondary | ICD-10-CM | POA: Diagnosis present

## 2020-12-21 DIAGNOSIS — Z419 Encounter for procedure for purposes other than remedying health state, unspecified: Secondary | ICD-10-CM

## 2020-12-21 DIAGNOSIS — E875 Hyperkalemia: Secondary | ICD-10-CM | POA: Diagnosis present

## 2020-12-21 DIAGNOSIS — I252 Old myocardial infarction: Secondary | ICD-10-CM

## 2020-12-21 DIAGNOSIS — Z66 Do not resuscitate: Secondary | ICD-10-CM | POA: Diagnosis not present

## 2020-12-21 DIAGNOSIS — Z01810 Encounter for preprocedural cardiovascular examination: Secondary | ICD-10-CM | POA: Diagnosis not present

## 2020-12-21 DIAGNOSIS — Z888 Allergy status to other drugs, medicaments and biological substances status: Secondary | ICD-10-CM

## 2020-12-21 DIAGNOSIS — Z7902 Long term (current) use of antithrombotics/antiplatelets: Secondary | ICD-10-CM

## 2020-12-21 DIAGNOSIS — I10 Essential (primary) hypertension: Secondary | ICD-10-CM | POA: Diagnosis present

## 2020-12-21 DIAGNOSIS — Z955 Presence of coronary angioplasty implant and graft: Secondary | ICD-10-CM | POA: Diagnosis not present

## 2020-12-21 DIAGNOSIS — G834 Cauda equina syndrome: Secondary | ICD-10-CM | POA: Diagnosis present

## 2020-12-21 DIAGNOSIS — Z96651 Presence of right artificial knee joint: Secondary | ICD-10-CM | POA: Diagnosis present

## 2020-12-21 DIAGNOSIS — Z87891 Personal history of nicotine dependence: Secondary | ICD-10-CM

## 2020-12-21 DIAGNOSIS — Z8249 Family history of ischemic heart disease and other diseases of the circulatory system: Secondary | ICD-10-CM

## 2020-12-21 DIAGNOSIS — Z881 Allergy status to other antibiotic agents status: Secondary | ICD-10-CM

## 2020-12-21 DIAGNOSIS — I739 Peripheral vascular disease, unspecified: Secondary | ICD-10-CM | POA: Diagnosis present

## 2020-12-21 DIAGNOSIS — M545 Low back pain, unspecified: Secondary | ICD-10-CM | POA: Diagnosis present

## 2020-12-21 DIAGNOSIS — I25119 Atherosclerotic heart disease of native coronary artery with unspecified angina pectoris: Secondary | ICD-10-CM | POA: Diagnosis present

## 2020-12-21 DIAGNOSIS — Z20822 Contact with and (suspected) exposure to covid-19: Secondary | ICD-10-CM | POA: Diagnosis present

## 2020-12-21 DIAGNOSIS — M5126 Other intervertebral disc displacement, lumbar region: Principal | ICD-10-CM | POA: Diagnosis present

## 2020-12-21 DIAGNOSIS — K449 Diaphragmatic hernia without obstruction or gangrene: Secondary | ICD-10-CM | POA: Diagnosis present

## 2020-12-21 LAB — CBC
HCT: 37.7 % (ref 36.0–46.0)
Hemoglobin: 12.1 g/dL (ref 12.0–15.0)
MCH: 29.6 pg (ref 26.0–34.0)
MCHC: 32.1 g/dL (ref 30.0–36.0)
MCV: 92.2 fL (ref 80.0–100.0)
Platelets: 239 10*3/uL (ref 150–400)
RBC: 4.09 MIL/uL (ref 3.87–5.11)
RDW: 13 % (ref 11.5–15.5)
WBC: 7.1 10*3/uL (ref 4.0–10.5)
nRBC: 0 % (ref 0.0–0.2)

## 2020-12-21 LAB — URINALYSIS, ROUTINE W REFLEX MICROSCOPIC
Bilirubin Urine: NEGATIVE
Glucose, UA: NEGATIVE mg/dL
Hgb urine dipstick: NEGATIVE
Ketones, ur: NEGATIVE mg/dL
Leukocytes,Ua: NEGATIVE
Nitrite: NEGATIVE
Protein, ur: NEGATIVE mg/dL
Specific Gravity, Urine: 1.003 — ABNORMAL LOW (ref 1.005–1.030)
pH: 8 (ref 5.0–8.0)

## 2020-12-21 LAB — PROTIME-INR
INR: 1 (ref 0.8–1.2)
Prothrombin Time: 13.6 seconds (ref 11.4–15.2)

## 2020-12-21 LAB — BASIC METABOLIC PANEL
Anion gap: 10 (ref 5–15)
BUN: 16 mg/dL (ref 8–23)
CO2: 28 mmol/L (ref 22–32)
Calcium: 9.3 mg/dL (ref 8.9–10.3)
Chloride: 102 mmol/L (ref 98–111)
Creatinine, Ser: 0.93 mg/dL (ref 0.44–1.00)
GFR, Estimated: >60 mL/min (ref >60)
Glucose, Bld: 92 mg/dL (ref 70–99)
Potassium: 5.1 mmol/L (ref 3.5–5.1)
Sodium: 140 mmol/L (ref 135–145)

## 2020-12-21 LAB — MRSA PCR SCREENING: MRSA by PCR: NEGATIVE

## 2020-12-21 LAB — APTT: aPTT: 31 seconds (ref 24–36)

## 2020-12-21 MED ORDER — ONDANSETRON HCL 4 MG/2ML IJ SOLN: 4.0000 mg | Freq: Four times a day (QID) | INTRAMUSCULAR | Status: DC | PRN

## 2020-12-21 MED ORDER — GABAPENTIN 100 MG PO CAPS
100.0000 mg | ORAL_CAPSULE | Freq: Every day | ORAL | Status: DC
  Administered 2020-12-21 – 2020-12-22 (×2): 300 mg via ORAL
  Filled 2020-12-21 (×2): qty 3

## 2020-12-21 MED ORDER — OXYCODONE HCL 5 MG PO TABS
10.0000 mg | ORAL_TABLET | ORAL | Status: DC | PRN
  Administered 2020-12-22 – 2020-12-23 (×4): 10 mg via ORAL
  Filled 2020-12-21 (×4): qty 2

## 2020-12-21 MED ORDER — DULOXETINE HCL 20 MG PO CPEP
40.0000 mg | ORAL_CAPSULE | Freq: Every day | ORAL | Status: DC
  Administered 2020-12-21 – 2020-12-24 (×4): 40 mg via ORAL
  Filled 2020-12-21 (×5): qty 2

## 2020-12-21 MED ORDER — SODIUM CHLORIDE 0.9 % IV SOLN: 250.0000 mL | INTRAVENOUS | Status: DC

## 2020-12-21 MED ORDER — SENNOSIDES-DOCUSATE SODIUM 8.6-50 MG PO TABS: 1.0000 | ORAL_TABLET | Freq: Every evening | ORAL | Status: DC | PRN

## 2020-12-21 MED ORDER — MENTHOL 3 MG MT LOZG
1.0000 | LOZENGE | OROMUCOSAL | Status: DC | PRN
  Filled 2020-12-21: qty 9

## 2020-12-21 MED ORDER — PANTOPRAZOLE SODIUM 40 MG PO TBEC
40.0000 mg | DELAYED_RELEASE_TABLET | Freq: Every day | ORAL | Status: DC
  Administered 2020-12-23 – 2020-12-25 (×3): 40 mg via ORAL
  Filled 2020-12-21 (×3): qty 1

## 2020-12-21 MED ORDER — LISINOPRIL 10 MG PO TABS
10.0000 mg | ORAL_TABLET | Freq: Every day | ORAL | Status: DC
  Filled 2020-12-21 (×2): qty 1

## 2020-12-21 MED ORDER — FLUTICASONE PROPIONATE 50 MCG/ACT NA SUSP
1.0000 | Freq: Every day | NASAL | Status: DC | PRN
  Filled 2020-12-21: qty 16

## 2020-12-21 MED ORDER — HYDROMORPHONE HCL 1 MG/ML IJ SOLN
0.5000 mg | INTRAMUSCULAR | Status: DC | PRN
  Administered 2020-12-22 – 2020-12-23 (×3): 0.5 mg via INTRAVENOUS
  Filled 2020-12-21 (×3): qty 1

## 2020-12-21 MED ORDER — METHOCARBAMOL 500 MG PO TABS
500.0000 mg | ORAL_TABLET | Freq: Four times a day (QID) | ORAL | Status: DC | PRN
  Administered 2020-12-21 – 2020-12-23 (×3): 500 mg via ORAL
  Filled 2020-12-21 (×3): qty 1

## 2020-12-21 MED ORDER — LEVOCETIRIZINE DIHYDROCHLORIDE 5 MG PO TABS: 5.0000 mg | ORAL_TABLET | Freq: Every evening | ORAL | Status: DC

## 2020-12-21 MED ORDER — METOPROLOL SUCCINATE ER 25 MG PO TB24
12.5000 mg | ORAL_TABLET | Freq: Every day | ORAL | Status: DC
  Administered 2020-12-21 – 2020-12-24 (×3): 12.5 mg via ORAL
  Filled 2020-12-21 (×4): qty 1

## 2020-12-21 MED ORDER — ROPINIROLE HCL 0.25 MG PO TABS
0.2500 mg | ORAL_TABLET | Freq: Every day | ORAL | Status: DC
  Administered 2020-12-21 – 2020-12-24 (×4): 0.25 mg via ORAL
  Filled 2020-12-21 (×5): qty 1

## 2020-12-21 MED ORDER — FLEET ENEMA 7-19 GM/118ML RE ENEM: 1.0000 | ENEMA | Freq: Once | RECTAL | Status: DC | PRN

## 2020-12-21 MED ORDER — PHENOL 1.4 % MT LIQD
1.0000 | OROMUCOSAL | Status: DC | PRN
  Filled 2020-12-21: qty 177

## 2020-12-21 MED ORDER — METHOCARBAMOL 1000 MG/10ML IJ SOLN
500.0000 mg | Freq: Four times a day (QID) | INTRAVENOUS | Status: DC | PRN
  Filled 2020-12-21 (×2): qty 5

## 2020-12-21 MED ORDER — ACETAMINOPHEN 650 MG RE SUPP: 650.0000 mg | RECTAL | Status: DC | PRN

## 2020-12-21 MED ORDER — SODIUM CHLORIDE 0.9% FLUSH
3.0000 mL | Freq: Two times a day (BID) | INTRAVENOUS | Status: DC
  Administered 2020-12-21 – 2020-12-25 (×7): 3 mL via INTRAVENOUS

## 2020-12-21 MED ORDER — ACETAMINOPHEN 325 MG PO TABS
650.0000 mg | ORAL_TABLET | ORAL | Status: DC | PRN
  Administered 2020-12-22: 650 mg via ORAL
  Filled 2020-12-21: qty 2

## 2020-12-21 MED ORDER — SODIUM CHLORIDE 0.9% FLUSH: 3.0000 mL | INTRAVENOUS | Status: DC | PRN

## 2020-12-21 MED ORDER — LORATADINE 10 MG PO TABS
10.0000 mg | ORAL_TABLET | Freq: Every day | ORAL | Status: DC
  Administered 2020-12-21 – 2020-12-24 (×4): 10 mg via ORAL
  Filled 2020-12-21 (×4): qty 1

## 2020-12-21 MED ORDER — POTASSIUM CHLORIDE IN NACL 20-0.9 MEQ/L-% IV SOLN
INTRAVENOUS | Status: DC
  Filled 2020-12-21 (×4): qty 1000

## 2020-12-21 MED ORDER — OXYCODONE HCL 5 MG PO TABS
5.0000 mg | ORAL_TABLET | ORAL | Status: DC | PRN
  Administered 2020-12-21 – 2020-12-25 (×6): 5 mg via ORAL
  Filled 2020-12-21 (×6): qty 1

## 2020-12-21 MED ORDER — BISACODYL 5 MG PO TBEC
5.0000 mg | DELAYED_RELEASE_TABLET | Freq: Every day | ORAL | Status: DC | PRN
Start: 2020-12-21 — End: 2020-12-25
  Administered 2020-12-24: 5 mg via ORAL
  Filled 2020-12-21: qty 1

## 2020-12-21 MED ORDER — ONDANSETRON HCL 4 MG PO TABS: 4.0000 mg | ORAL_TABLET | Freq: Four times a day (QID) | ORAL | Status: DC | PRN

## 2020-12-21 NOTE — H&P (Signed)
History of Present Illness:  See below from consultation in clinic.  12/21/2020 Ms. Kelly Ritter is here today with a chief complaint of low back pain with radiation into the bilateral buttock and posterior thighs stopping at the knee. She reports urinary and bowel incontinence and diminished sensation in her saddle area over the past few days.  She has been having problems for years, but consistent issues for the past 9 months. She now has pain as bad as 10 out of 10 in her buttocks that extends down her legs. She has numbness in her perineal region as well as in her legs. She gets pain immediately when she stands or walks. She has noticed increasing trouble controlling her bowel bladder over the past week to 2 weeks. She has had accidents.  Conservative measures:  Physical therapy: has participated in the past Multimodal medical therapy including regular antiinflammatories: tylenol, cymbalta, gabapentin, tramadol, prednisone, oxycodone Injections: has had epidural steroid injections 10/17/20: Bilateral S1 ESI 07/12/20: Bilateral S1 ESI 06/07/20: Bilateral S1 ESI  Past Surgery: none  Jahnasia Tatum has no symptoms of cervical myelopathy.  The symptoms are causing a significant impact on the patient's life.   Review of Systems:  A 10 point review of systems is negative, except for the pertinent positives and negatives detailed in the HPI.  Social History   Socioeconomic History  . Marital status: Widowed    Spouse name: Kennith Center  . Number of children: 4  . Years of education: some college  . Highest education level: 12th grade  Occupational History  . Occupation: Retired  Tobacco Use  . Smoking status: Former Smoker    Packs/day: 1.00    Years: 8.00    Pack years: 8.00    Types: Cigarettes    Quit date: 08/12/1989    Years since quitting: 31.3  . Smokeless tobacco: Never Used  . Tobacco comment: smoking cessation materials not required  Vaping Use  . Vaping Use: Never used   Substance and Sexual Activity  . Alcohol use: Not Currently    Alcohol/week: 0.0 standard drinks  . Drug use: Never  . Sexual activity: Not Currently    Birth control/protection: Post-menopausal  Other Topics Concern  . Not on file  Social History Narrative   Pt lives with daughter in mother in law suite   Social Determinants of Health   Financial Resource Strain: Low Risk   . Difficulty of Paying Living Expenses: Not hard at all  Food Insecurity: No Food Insecurity  . Worried About Charity fundraiser in the Last Year: Never true  . Ran Out of Food in the Last Year: Never true  Transportation Needs: No Transportation Needs  . Lack of Transportation (Medical): No  . Lack of Transportation (Non-Medical): No  Physical Activity: Inactive  . Days of Exercise per Week: 0 days  . Minutes of Exercise per Session: 0 min  Stress: No Stress Concern Present  . Feeling of Stress : Only a little  Social Connections: Unknown  . Frequency of Communication with Friends and Family: Not on file  . Frequency of Social Gatherings with Friends and Family: Not on file  . Attends Religious Services: Not on file  . Active Member of Clubs or Organizations: Not on file  . Attends Archivist Meetings: Not on file  . Marital Status: Widowed  Intimate Partner Violence: Not At Risk  . Fear of Current or Ex-Partner: No  . Emotionally Abused: No  . Physically Abused: No  .  Sexually Abused: No   Family History  Problem Relation Age of Onset  . Heart disease Mother   . Hypertension Brother   . Heart disease Brother 59       several MIs  . Heart disease Daughter 29       deceased from MI  . Heart disease Son    Current Outpatient Medications  Medication Instructions  . acetaminophen (TYLENOL) 1,300 mg, Oral, At bedtime PRN  . Cholecalciferol 1,000 Units, Oral, Daily  . clopidogrel (PLAVIX) 75 mg, Oral, Daily at bedtime  . Cyanocobalamin (B-12) 500 MCG TABS 1 tablet, Oral, Daily  .  diflunisal (DOLOBID) 500 MG TABS tablet 0.5 tablets, Oral, 2 times daily  . docusate sodium (COLACE) 100 mg, Oral, 2 times daily  . DULoxetine (CYMBALTA) 40 mg, Oral, Daily at bedtime  . fluticasone (FLONASE) 50 MCG/ACT nasal spray 1 spray, Each Nare, Daily PRN  . gabapentin (NEURONTIN) 100-300 mg, Oral, Daily at bedtime  . levocetirizine (XYZAL) 5 mg, Oral, Every evening  . lisinopril (ZESTRIL) 10 mg, Oral, Daily  . metoprolol succinate (TOPROL-XL) 12.5 mg, Oral, Daily at bedtime  . omeprazole (PRILOSEC) 40 mg, Oral, Daily at bedtime  . oxyCODONE (OXY IR/ROXICODONE) 5 mg, Oral, Every 6 hours PRN  . potassium gluconate 595 mg, Oral, At bedtime PRN  . rOPINIRole (REQUIP) 0.25 MG tablet TAKE 1 TABLET BY MOUTH EVERY EVENING  . traMADol (ULTRAM) 50 mg, Oral, Every 12 hours PRN   Allergies  Allergen Reactions  . Sulfa Antibiotics Hives, Itching, Swelling and Rash  . Baclofen Other (See Comments)    Muscle spasms and cramps in shoulder area  . Latex Rash, Hives and Other (See Comments)    Skin eruptions      Physical Examination:  Vitals:   12/21/20 1613  BP: (!) 156/62  Pulse: 68  Resp: 16  Temp: 97.6 F (36.4 C)  SpO2: 97%   Heart sounds normal no MRG. Chest Clear to Auscultation Bilaterally.  General: Patient is well developed, well nourished, calm, collected, and in no apparent distress. Attention to examination is appropriate.  Psychiatric: Patient is non-anxious.  Head: Pupils equal, round, and reactive to light.  ENT: Oral mucosa appears well hydrated.  Neck: Supple. Full range of motion.  Respiratory: Patient is breathing without any difficulty.  Extremities: No edema.  Vascular: Palpable dorsal pedal pulses.  Skin: On exposed skin, there are no abnormal skin lesions.  NEUROLOGICAL:   Awake, alert, oriented to person, place, and time. Speech is clear and fluent. Fund of knowledge is appropriate.   Cranial Nerves: Pupils equal round and reactive to light.  Facial tone is symmetric. Facial sensation is symmetric. Shoulder shrug is symmetric. Tongue protrusion is midline. There is no pronator drift.  ROM of spine: diminished.  Strength: Side Biceps Triceps Deltoid Interossei Grip Wrist Ext. Wrist Flex.  R 5 5 5 5 5 5 5   L 5 5 5 5 5 5 5    Side Iliopsoas Quads Hamstring PF DF EHL  R 5 5 5  - - -  L 5 5 5 5 4 5    Her right ankle and great toe are fused.   Reflexes are 1+ and symmetric at the biceps, triceps, brachioradialis, patella. Hoffman's is absent.  Bilateral upper and lower extremity sensation is intact to light touch with exception of L5 and below distributions, which are diminished bilaterally.  Gait is untested due to inability to walk due to pain. No evidence of dysmetria noted.  Medical Decision  Making  Imaging: MRI CTL spine 12/20/2020 Impression  Multilevel degenerative changes of the cervical spine detailed above resulting in up to moderate spinal canal stenosis at C4-C5 and severe bilateral neural foraminal stenosis at multiple levels.   No evidence of cord compression or cord signal abnormality.  Impression  Unremarkable thoracic spine MRI.  Impression  Multilevel degenerative disc disease of the lumbar spine, most severe at L4-L5 where there is a large left subarticular disc extrusion causing severe spinal canal narrowing. Moderate to severe bilateral neuroforaminal narrowing at this level.  I have personally reviewed the images and agree with the above interpretation.  Assessment and Plan: Ms. Dehart is a pleasant 87 y.o. female with large left-sided L4-5 disc herniation with symptoms concerning for cauda equina syndrome. She has had worsening symptoms over time. She has now had worsening function of her bowel and bladder. Due to these issues, I have recommended that she be admitted to the hospital for urgent surgical intervention.  I recommended L4-5 discectomy.  I discussed the planned procedure at length with  the patient, including the risks, benefits, alternatives, and indications. The risks discussed include but are not limited to bleeding, infection, need for reoperation, spinal fluid leak, stroke, vision loss, anesthetic complication, coma, paralysis, and even death. I also described in detail that improvement was not guaranteed.  The patient expressed understanding of these risks, and asked that we proceed with surgery. I described the surgery in layman's terms, and gave ample opportunity for questions, which were answered to the best of my ability.   Meade Maw MD, [facility] Department of Neurosurgery

## 2020-12-22 ENCOUNTER — Inpatient Hospital Stay: Payer: Medicare Other

## 2020-12-22 ENCOUNTER — Encounter
Admission: AD | Disposition: A | Discharge status: Home Health | Payer: Self-pay | Source: Ambulatory Visit | Attending: Neurosurgery

## 2020-12-22 ENCOUNTER — Inpatient Hospital Stay: Payer: Medicare Other | Admitting: Certified Registered"

## 2020-12-22 ENCOUNTER — Encounter: Payer: Self-pay | Admitting: Neurosurgery

## 2020-12-22 ENCOUNTER — Inpatient Hospital Stay: Admission: RE | Admit: 2020-12-22 | Payer: Medicare Other | Source: Home / Self Care | Admitting: Neurosurgery

## 2020-12-22 ENCOUNTER — Other Ambulatory Visit: Payer: Self-pay

## 2020-12-22 HISTORY — PX: LUMBAR LAMINECTOMY/DECOMPRESSION MICRODISCECTOMY: SHX5026

## 2020-12-22 LAB — BLOOD GAS, ARTERIAL
Acid-base deficit: 0.5 mmol/L (ref 0.0–2.0)
Bicarbonate: 27.6 mmol/L (ref 20.0–28.0)
FIO2: 0.36
O2 Saturation: 88.4 %
pCO2 arterial: 60 mmHg — ABNORMAL HIGH (ref 32.0–48.0)
pH, Arterial: 7.27 — ABNORMAL LOW (ref 7.350–7.450)
pO2, Arterial: 57 mmHg — ABNORMAL LOW (ref 83.0–108.0)

## 2020-12-22 LAB — GLUCOSE, CAPILLARY: Glucose-Capillary: 204 mg/dL — ABNORMAL HIGH (ref 70–99)

## 2020-12-22 LAB — SARS CORONAVIRUS 2 (TAT 6-24 HRS): SARS Coronavirus 2: NEGATIVE

## 2020-12-22 SURGERY — LUMBAR LAMINECTOMY/DECOMPRESSION MICRODISCECTOMY 1 LEVEL
Anesthesia: General | Site: Back

## 2020-12-22 MED ORDER — BUPIVACAINE-EPINEPHRINE (PF) 0.5% -1:200000 IJ SOLN
INTRAMUSCULAR | Status: AC
  Filled 2020-12-22: qty 30

## 2020-12-22 MED ORDER — ONDANSETRON HCL 4 MG/2ML IJ SOLN: 4.0000 mg | Freq: Once | INTRAMUSCULAR | Status: DC | PRN

## 2020-12-22 MED ORDER — LIDOCAINE HCL (CARDIAC) PF 100 MG/5ML IV SOSY
PREFILLED_SYRINGE | INTRAVENOUS | Status: DC | PRN
  Administered 2020-12-22: 60 mg via INTRAVENOUS

## 2020-12-22 MED ORDER — FENTANYL CITRATE (PF) 100 MCG/2ML IJ SOLN
INTRAMUSCULAR | Status: AC
  Administered 2020-12-22: 25 ug via INTRAVENOUS
  Filled 2020-12-22: qty 2

## 2020-12-22 MED ORDER — CEFAZOLIN SODIUM-DEXTROSE 2-3 GM-%(50ML) IV SOLR
INTRAVENOUS | Status: DC | PRN
  Administered 2020-12-22: 2 g via INTRAVENOUS

## 2020-12-22 MED ORDER — ROCURONIUM BROMIDE 100 MG/10ML IV SOLN
INTRAVENOUS | Status: DC | PRN
  Administered 2020-12-22: 50 mg via INTRAVENOUS

## 2020-12-22 MED ORDER — SUGAMMADEX SODIUM 200 MG/2ML IV SOLN
INTRAVENOUS | Status: DC | PRN
  Administered 2020-12-22: 200 mg via INTRAVENOUS

## 2020-12-22 MED ORDER — LACTATED RINGERS IV SOLN: INTRAVENOUS | Status: DC | PRN

## 2020-12-22 MED ORDER — BUPIVACAINE HCL 0.5 % IJ SOLN
INTRAMUSCULAR | Status: DC | PRN
  Administered 2020-12-22: 20 mL

## 2020-12-22 MED ORDER — BUPIVACAINE LIPOSOME 1.3 % IJ SUSP
INTRAMUSCULAR | Status: AC
  Filled 2020-12-22: qty 20

## 2020-12-22 MED ORDER — PROPOFOL 10 MG/ML IV BOLUS
INTRAVENOUS | Status: DC | PRN
  Administered 2020-12-22: 110 mg via INTRAVENOUS

## 2020-12-22 MED ORDER — BUPIVACAINE-EPINEPHRINE (PF) 0.5% -1:200000 IJ SOLN
INTRAMUSCULAR | Status: DC | PRN
  Administered 2020-12-22: 3 mL via PERINEURAL

## 2020-12-22 MED ORDER — DEXMEDETOMIDINE (PRECEDEX) IN NS 20 MCG/5ML (4 MCG/ML) IV SYRINGE
PREFILLED_SYRINGE | INTRAVENOUS | Status: DC | PRN
  Administered 2020-12-22 (×2): 4 ug via INTRAVENOUS

## 2020-12-22 MED ORDER — ONDANSETRON HCL 4 MG/2ML IJ SOLN
INTRAMUSCULAR | Status: DC | PRN
  Administered 2020-12-22: 4 mg via INTRAVENOUS

## 2020-12-22 MED ORDER — METHYLPREDNISOLONE ACETATE 40 MG/ML IJ SUSP
INTRAMUSCULAR | Status: AC
  Filled 2020-12-22: qty 1

## 2020-12-22 MED ORDER — EPHEDRINE SULFATE 50 MG/ML IJ SOLN
INTRAMUSCULAR | Status: DC | PRN
  Administered 2020-12-22: 5 mg via INTRAVENOUS
  Administered 2020-12-22 (×2): 10 mg via INTRAVENOUS

## 2020-12-22 MED ORDER — KETOROLAC TROMETHAMINE 30 MG/ML IJ SOLN: 30.0000 mg | Freq: Once | INTRAMUSCULAR | Status: AC

## 2020-12-22 MED ORDER — SODIUM CHLORIDE FLUSH 0.9 % IV SOLN
INTRAVENOUS | Status: AC
  Filled 2020-12-22: qty 20

## 2020-12-22 MED ORDER — THROMBIN 5000 UNITS EX SOLR
CUTANEOUS | Status: AC
  Filled 2020-12-22: qty 5000

## 2020-12-22 MED ORDER — METHYLPREDNISOLONE ACETATE 40 MG/ML IJ SUSP
INTRAMUSCULAR | Status: DC | PRN
  Administered 2020-12-22: 40 mg

## 2020-12-22 MED ORDER — SODIUM CHLORIDE 0.9 % IV SOLN
INTRAVENOUS | Status: DC | PRN
  Administered 2020-12-22: 20 mL

## 2020-12-22 MED ORDER — FENTANYL CITRATE (PF) 100 MCG/2ML IJ SOLN
25.0000 ug | INTRAMUSCULAR | Status: DC | PRN
  Administered 2020-12-22 (×2): 25 ug via INTRAVENOUS

## 2020-12-22 MED ORDER — DEXAMETHASONE SODIUM PHOSPHATE 10 MG/ML IJ SOLN
INTRAMUSCULAR | Status: DC | PRN
  Administered 2020-12-22: 5 mg via INTRAVENOUS

## 2020-12-22 MED ORDER — KETOROLAC TROMETHAMINE 30 MG/ML IJ SOLN
INTRAMUSCULAR | Status: AC
  Administered 2020-12-22: 30 mg via INTRAVENOUS
  Filled 2020-12-22: qty 1

## 2020-12-22 MED ORDER — THROMBIN 5000 UNITS EX SOLR
CUTANEOUS | Status: DC | PRN
  Administered 2020-12-22: 5000 [IU] via TOPICAL

## 2020-12-22 MED ORDER — BUPIVACAINE HCL (PF) 0.5 % IJ SOLN
INTRAMUSCULAR | Status: AC
  Filled 2020-12-22: qty 30

## 2020-12-22 MED ORDER — FENTANYL CITRATE (PF) 100 MCG/2ML IJ SOLN
INTRAMUSCULAR | Status: DC | PRN
  Administered 2020-12-22: 50 ug via INTRAVENOUS
  Administered 2020-12-22 (×2): 25 ug via INTRAVENOUS

## 2020-12-22 SURGICAL SUPPLY — 51 items
BUR NEURO DRILL SOFT 3.0X3.8M (BURR) ×2 IMPLANT
CHLORAPREP W/TINT 26 (MISCELLANEOUS) ×4 IMPLANT
CNTNR SPEC 2.5X3XGRAD LEK (MISCELLANEOUS) ×1
CONT SPEC 4OZ STER OR WHT (MISCELLANEOUS) ×1
CONT SPEC 4OZ STRL OR WHT (MISCELLANEOUS) ×1
CONTAINER SPEC 2.5X3XGRAD LEK (MISCELLANEOUS) ×1 IMPLANT
COUNTER NEEDLE 20/40 LG (NEEDLE) ×2 IMPLANT
COVER WAND RF STERILE (DRAPES) ×2 IMPLANT
CUP MEDICINE 2OZ PLAST GRAD ST (MISCELLANEOUS) ×4 IMPLANT
DERMABOND ADVANCED (GAUZE/BANDAGES/DRESSINGS) ×1
DERMABOND ADVANCED .7 DNX12 (GAUZE/BANDAGES/DRESSINGS) ×1 IMPLANT
DRAPE C ARM PK CFD 31 SPINE (DRAPES) ×2 IMPLANT
DRAPE LAPAROTOMY 100X77 ABD (DRAPES) ×2 IMPLANT
DRAPE MICROSCOPE SPINE 48X150 (DRAPES) ×2 IMPLANT
DRAPE SURG 17X11 SM STRL (DRAPES) ×8 IMPLANT
DRSG OPSITE POSTOP 4X6 (GAUZE/BANDAGES/DRESSINGS) ×2 IMPLANT
ELECT CAUTERY BLADE TIP 2.5 (TIP) ×2
ELECT EZSTD 165MM 6.5IN (MISCELLANEOUS)
ELECT REM PT RETURN 9FT ADLT (ELECTROSURGICAL) ×2
ELECTRODE CAUTERY BLDE TIP 2.5 (TIP) ×1 IMPLANT
ELECTRODE EZSTD 165MM 6.5IN (MISCELLANEOUS) IMPLANT
ELECTRODE REM PT RTRN 9FT ADLT (ELECTROSURGICAL) ×1 IMPLANT
GLOVE SURG SYN 8.5  E (GLOVE) ×6
GLOVE SURG SYN 8.5 E (GLOVE) ×3 IMPLANT
GOWN SRG XL LVL 3 NONREINFORCE (GOWNS) ×1 IMPLANT
GOWN STRL NON-REIN TWL XL LVL3 (GOWNS) ×2
GOWN STRL REUS W/ TWL XL LVL3 (GOWN DISPOSABLE) ×1 IMPLANT
GOWN STRL REUS W/TWL XL LVL3 (GOWN DISPOSABLE) ×2
GRADUATE 1200CC STRL 31836 (MISCELLANEOUS) ×2 IMPLANT
KIT SPINAL PRONEVIEW (KITS) ×2 IMPLANT
KNIFE BAYONET SHORT DISCETOMY (MISCELLANEOUS) IMPLANT
MANIFOLD NEPTUNE II (INSTRUMENTS) ×2 IMPLANT
MARKER SKIN DUAL TIP RULER LAB (MISCELLANEOUS) ×2 IMPLANT
NDL SAFETY ECLIPSE 18X1.5 (NEEDLE) ×1 IMPLANT
NEEDLE HYPO 18GX1.5 SHARP (NEEDLE) ×2
NEEDLE HYPO 22GX1.5 SAFETY (NEEDLE) ×2 IMPLANT
NS IRRIG 1000ML POUR BTL (IV SOLUTION) ×2 IMPLANT
PACK LAMINECTOMY NEURO (CUSTOM PROCEDURE TRAY) ×2 IMPLANT
PAD ARMBOARD 7.5X6 YLW CONV (MISCELLANEOUS) ×2 IMPLANT
SPOGE SURGIFLO 8M (HEMOSTASIS) ×2
SPONGE SURGIFLO 8M (HEMOSTASIS) ×1 IMPLANT
SUT DVC VLOC 3-0 CL 6 P-12 (SUTURE) ×2 IMPLANT
SUT VIC AB 0 CT1 27 (SUTURE) ×2
SUT VIC AB 0 CT1 27XCR 8 STRN (SUTURE) ×1 IMPLANT
SUT VIC AB 2-0 CT1 18 (SUTURE) ×2 IMPLANT
SYR 10ML LL (SYRINGE) ×2 IMPLANT
SYR 20ML LL LF (SYRINGE) ×2 IMPLANT
SYR 30ML LL (SYRINGE) ×4 IMPLANT
SYR 3ML LL SCALE MARK (SYRINGE) ×2 IMPLANT
TOWEL OR 17X26 4PK STRL BLUE (TOWEL DISPOSABLE) ×6 IMPLANT
TUBING CONNECTING 10 (TUBING) ×2 IMPLANT

## 2020-12-22 NOTE — Anesthesia Procedure Notes (Signed)
Procedure Name: Intubation Date/Time: 12/22/2020 10:23 AM Performed by: Jerrye Noble, CRNA Pre-anesthesia Checklist: Patient identified, Emergency Drugs available, Suction available and Patient being monitored Patient Re-evaluated:Patient Re-evaluated prior to induction Oxygen Delivery Method: Circle system utilized Preoxygenation: Pre-oxygenation with 100% oxygen Induction Type: IV induction Ventilation: Mask ventilation without difficulty Laryngoscope Size: Mac and 3 Grade View: Grade I Tube type: Oral Tube size: 7.0 mm Number of attempts: 1 Airway Equipment and Method: Stylet,  Oral airway and Video-laryngoscopy Placement Confirmation: ETT inserted through vocal cords under direct vision,  positive ETCO2 and breath sounds checked- equal and bilateral Secured at: 22 cm Tube secured with: Tape Dental Injury: Teeth and Oropharynx as per pre-operative assessment

## 2020-12-22 NOTE — Interval H&P Note (Signed)
History and Physical Interval Note:  12/22/2020 8:52 AM  Kelly Ritter  has presented today for surgery, with the diagnosis of cauda equina syndrome g83.4.  The various methods of treatment have been discussed with the patient and family. After consideration of risks, benefits and other options for treatment, the patient has consented to  Procedure(s): L4-5 DISCECTOMY (N/A) as a surgical intervention.  The patient's history has been reviewed, patient examined, no change in status, stable for surgery.  I have reviewed the patient's chart and labs.  Questions were answered to the patient's satisfaction.     Zollie Ellery

## 2020-12-22 NOTE — Op Note (Signed)
Indications: Ms. Mclees is an 87 yo female who presented with cauda equina syndrome.  She was admitted from clinic for urgent surgical intervention  Findings: large L4-5 disc herniation, overgrown ligamentum flavum  Preoperative Diagnosis: cauda equina syndrome Postoperative Diagnosis: same   EBL: 25 ml IVF: 1000 ml Drains: none Disposition: Extubated and Stable to PACU Complications: none  No foley catheter was placed.   Preoperative Note:   Risks of surgery discussed include: infection, bleeding, stroke, coma, death, paralysis, CSF leak, nerve/spinal cord injury, numbness, tingling, weakness, complex regional pain syndrome, recurrent stenosis and/or disc herniation, vascular injury, development of instability, neck/back pain, need for further surgery, persistent symptoms, development of deformity, and the risks of anesthesia. The patient understood these risks and agreed to proceed.  Operative Note:   1) Left L4/5 microdiscectomy and bilateral decompression  The patient was then brought from the preoperative center with intravenous access established.  The patient underwent general anesthesia and endotracheal tube intubation, and was then rotated on the [place] rail top where all pressure points were appropriately padded.  The skin was then thoroughly cleansed.  Perioperative antibiotic prophylaxis was administered.  Sterile prep and drapes were then applied and a timeout was then observed.  C-arm was brought into the field under sterile conditions, and the L4-5 disc space identified and marked with an incision in the midline.  Once this was complete a 3 cm incision was opened with the use of a #10 blade knife.  The Metrx tubes were sequentially advanced under lateral fluoroscopy until a 18 x 50 mm Metrx tube was placed over the facet and lamina and secured to the bed.    The microscope was then sterilely brought into the field and muscle creep was hemostased with a bipolar and resected  with a pituitary rongeur.  A Bovie extender was then used to expose the spinous process and lamina.  Careful attention was placed to not violate the facet capsule. A 3 mm matchstick drill bit was then used to make a hemi-laminotomy trough until the ligamentum flavum was exposed.  This was extended to the base of the spinous process and slightly to the contralateral side.  Once this was complete and the underlying ligamentum flavum was visualized this was dissected with an up angle curette and resected with a #2 and #3 mm biting Kerrison.  The laminotomy opening was also expanded in similar fashion and hemostasis was obtained with Surgifoam and a patty as well as bone wax.  The rostral aspect of the caudal level of the lamina was also resected with a #2 biting Kerrison effort to further enhance exposure.  Once the underlying dura was visualized a Penfield 4 was then used to dissect and expose the traversing nerve root.  Once this was identified a nerve root retractor suction was used to mobilize this medially.  The venous plexus was hemostased with Surgifoam and light bipolar use.  A small penfield was then used to make a small annulotomy within the disc space and disc space contents were noted to come through the annulus.    The disc herniation was identified and dissected free using a balltip probe. The pituitary rongeur was used to remove the extruded disc fragments. Once the thecal sac and nerve root were noted to be relaxed and under less tension the ball-tipped feeler was passed along the foramen distally to to ensure no residual compression was noted.    I then used the 29mm punch to remove the contralateral ligamentum flavum, and  to remove the overgrown medial facet until the right L5 nerve root was freed.    Depo-Medrol was placed along the nerve root.  The area was irrigated. The tube system was then removed under microscopic visualization and hemostasis was obtained with a bipolar.    The fascial  layer was reapproximated with the use of a 0- Vicryl suture.  Subcutaneous tissue layer was reapproximated using 2-0 Vicryl suture.  3-0 monocryl was used on the skin. The skin was then cleansed and Dermabond was used to close the skin opening.  Patient was then rotated back to the preoperative bed awakened from anesthesia and taken to recovery all counts are correct in this case.   I performed the entire procedure without assistance.  Meade Maw MD

## 2020-12-22 NOTE — Evaluation (Signed)
Physical Therapy Evaluation Patient Details Name: Kelly Ritter MRN: [id number] DOB: [birth year] Today's Date: 12/22/2020   History of Present Illness  Pt is an 87 yo female diagnosed with cauda equina syndrome and is s/p left L4-5 microdiscectomy and bilateral decompression. PMH includes: MI, CAD s/p stent placement, HTN, anxiety, CVA, R ankle fusion, hiatal hernia, and skin CA.    Clinical Impression  Pt was pleasant and motivated to participate during the session. Pt required extra time, cuing, effort, and min A during log roll training with rolling and sidelying to/from sit.  Pt did not require physical assistance with transfers but required two attempts to come to full upright standing as well as increased time and effort.  Pt initially had significant difficulty placing weight through her RLE secondary to pain but after standing therex and ambulation she showed good improvement of her RLE stance time during gait.  Pt was steady with all standing activities with no adverse symptoms noted other than RLE and low back pain and her SpO2 on 2LO2/min remained from 93-96% and HR WNL.  Pt will benefit from HHPT upon discharge to safely address deficits listed in patient problem list for decreased caregiver assistance and eventual return to PLOF.      Follow Up Recommendations Home health PT;Supervision/Assistance - 24 hour    Equipment Recommendations  None recommended by PT    Recommendations for Other Services       Precautions / Restrictions Precautions Precautions: Fall Restrictions Weight Bearing Restrictions: No Other Position/Activity Restrictions: No back brace required      Mobility  Bed Mobility Overal bed mobility: Needs Assistance Bed Mobility: Rolling;Sidelying to Sit;Sit to Sidelying Rolling: Min assist Sidelying to sit: Min assist     Sit to sidelying: Min assist General bed mobility comments: Log roll training provided with min A for BLE and trunk management and  mod verbal and tactile cues for sequencing    Transfers Overall transfer level: Needs assistance Equipment used: Rolling walker (2 wheeled) Transfers: Sit to/from Stand Sit to Stand: Min guard         General transfer comment: Extra time and effort required to come to standing but no physical assistance  Ambulation/Gait Ambulation/Gait assistance: Min guard Gait Distance (Feet): 15 Feet x 2 Assistive device: Rolling walker (2 wheeled) Gait Pattern/deviations: Step-to pattern;Antalgic;Decreased stance time - right Gait velocity: decreased   General Gait Details: Antalgic gait pattern on the RLE with very limited stance time initially but gradually improved as the session progressed but pt maintained step-to pattern throughout  Stairs            Wheelchair Mobility    Modified Rankin (Stroke Patients Only)       Balance Overall balance assessment: Needs assistance   Sitting balance-Leahy Scale: Normal     Standing balance support: Bilateral upper extremity supported;During functional activity Standing balance-Leahy Scale: Fair Standing balance comment: Mod lean on the RW for support                             Pertinent Vitals/Pain Pain Assessment: 0-10 Pain Score: 4  Pain Location: R lower back and RLE Pain Descriptors / Indicators: Sore Pain Intervention(s): Premedicated before session;Monitored during session;Repositioned    Home Living Family/patient expects to be discharged to:: Private residence Living Arrangements: Alone Available Help at Discharge: Family;Available 24 hours/day Type of Home: House Home Access: Level entry     Home Layout: One level Home  Equipment: Grab bars - toilet;Walker - 2 wheels;Shower seat - built in;Grab bars - tub/shower Additional Comments: Lives next door to daughter and granddaughter, 24/7 supervision as needed    Prior Function Level of Independence: Independent with assistive device(s)          Comments: Mod Ind amb with a RW, one fall in the last 6 months secondary to RLE buckling, Ind with ADLs     Hand Dominance        Extremity/Trunk Assessment   Upper Extremity Assessment Upper Extremity Assessment: Overall WFL for tasks assessed    Lower Extremity Assessment Lower Extremity Assessment: Generalized weakness;RLE deficits/detail;LLE deficits/detail RLE Deficits / Details: Sensation to light touch intact but deminished, chronic per patient; R ankle fused; R knee flex and ext 4/5 RLE Sensation: decreased light touch RLE Coordination: WNL LLE Deficits / Details: Hip, knee, and ankle strength 4 to 4+/5 LLE Sensation: WNL LLE Coordination: WNL       Communication   Communication: No difficulties  Cognition Arousal/Alertness: Awake/alert Behavior During Therapy: WFL for tasks assessed/performed Overall Cognitive Status: Within Functional Limits for tasks assessed                                        General Comments      Exercises Total Joint Exercises Ankle Circles/Pumps: AROM;Strengthening;Both;10 reps (min ROM on the R secondary to fusion) Quad Sets: Strengthening;Both;10 reps Gluteal Sets: Strengthening;Both;10 reps Heel Slides: AROM;Strengthening;Both;5 reps Long Arc Quad: AROM;Strengthening;Both;10 reps Marching in Standing: AROM;Strengthening;Both;5 reps;Standing Other Exercises Other Exercises: log roll training provided   Assessment/Plan    PT Assessment Patient needs continued PT services  PT Problem List Decreased strength;Decreased balance;Decreased activity tolerance;Decreased mobility;Decreased knowledge of use of DME;Pain       PT Treatment Interventions DME instruction;Gait training;Functional mobility training;Therapeutic activities;Therapeutic exercise;Balance training;Patient/family education    PT Goals (Current goals can be found in the Care Plan section)  Acute Rehab PT Goals Patient Stated Goal: To walk  better and be more active PT Goal Formulation: With patient Time For Goal Achievement: 01/04/21 Potential to Achieve Goals: Good    Frequency 7X/week   Barriers to discharge        Co-evaluation               AM-PAC PT "6 Clicks" Mobility  Outcome Measure Help needed turning from your back to your side while in a flat bed without using bedrails?: A Little Help needed moving from lying on your back to sitting on the side of a flat bed without using bedrails?: A Little Help needed moving to and from a bed to a chair (including a wheelchair)?: A Little Help needed standing up from a chair using your arms (e.g., wheelchair or bedside chair)?: A Little Help needed to walk in hospital room?: A Little Help needed climbing 3-5 steps with a railing? : A Little 6 Click Score: 18    End of Session Equipment Utilized During Treatment: Gait belt Activity Tolerance: Patient tolerated treatment well Patient left: in bed;with call bell/phone within reach;with SCD's reapplied;with bed alarm set;Other (comment) (Pt declined up in chair) Nurse Communication: Mobility status PT Visit Diagnosis: Unsteadiness on feet (R26.81);Other abnormalities of gait and mobility (R26.89);Muscle weakness (generalized) (M62.81);Pain Pain - Right/Left: Right Pain - part of body: Leg (R low back)    Time: 5784-6962 PT Time Calculation (min) (ACUTE ONLY): 47 min   Charges:  PT Evaluation $PT Eval Moderate Complexity: 1 Mod PT Treatments $Therapeutic Exercise: 8-22 mins $Therapeutic Activity: 8-22 mins       D. Royetta Asal PT, DPT 12/22/20, 5:40 PM

## 2020-12-22 NOTE — Progress Notes (Signed)
OT Cancellation Note  Patient Details Name: Kelly Ritter MRN: [id number] DOB: October 11, 1933   Cancelled Treatment:    Reason Eval/Treat Not Completed: Patient at procedure or test/ unavailable. OT order received and chart reviewed. Pt currently undergoing surgical intervention of L4-5 discectomy. OT will attempt evaluation when pt is medically able to participate after procedure.  Darleen Crocker, [place], OTR/L , CBIS ascom [phone]  12/22/20, 8:53 AM   12/22/2020, 8:53 AM

## 2020-12-22 NOTE — Progress Notes (Signed)
PT Cancellation Note  Patient Details Name: Kelly Ritter MRN: [id number] DOB: 05/12/1934   Cancelled Treatment:    Reason Eval/Treat Not Completed: Patient at procedure or test/unavailable PT orders received, chart reviewed. Pt with plans for surgery today (L4-5 discectomy). Will need new PT orders, if appropriate, following surgery.   Lavone Nian, PT, DPT 12/22/20, 8:56 AM    Waunita Schooner 12/22/2020, 8:52 AM

## 2020-12-22 NOTE — Transfer of Care (Signed)
Immediate Anesthesia Transfer of Care Note  Patient: Kelly Ritter  Procedure(s) Performed: L4-5 DISCECTOMY (N/A Back)  Patient Location: PACU  Anesthesia Type:General  Level of Consciousness: drowsy and patient cooperative  Airway & Oxygen Therapy: Patient Spontanous Breathing and Patient connected to face mask oxygen  Post-op Assessment: Report given to RN and Post -op Vital signs reviewed and stable  Post vital signs: Reviewed and stable  Last Vitals:  Vitals Value Taken Time  BP 179/81 12/22/20 1218  Temp    Pulse 81 12/22/20 1221  Resp 11 12/22/20 1221  SpO2 99 % 12/22/20 1221  Vitals shown include unvalidated device data.  Last Pain:  Vitals:   12/22/20 0900  TempSrc: Oral  PainSc: 0-No pain      Patients Stated Pain Goal: 3 (54/65/68 1275)  Complications: No complications documented.
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
## 2020-12-23 DIAGNOSIS — I209 Angina pectoris, unspecified: Secondary | ICD-10-CM | POA: Diagnosis not present

## 2020-12-23 DIAGNOSIS — E875 Hyperkalemia: Secondary | ICD-10-CM | POA: Diagnosis present

## 2020-12-23 DIAGNOSIS — Z01810 Encounter for preprocedural cardiovascular examination: Secondary | ICD-10-CM

## 2020-12-23 LAB — CBC
HCT: 34.4 % — ABNORMAL LOW (ref 36.0–46.0)
Hemoglobin: 11 g/dL — ABNORMAL LOW (ref 12.0–15.0)
MCH: 29.9 pg (ref 26.0–34.0)
MCHC: 32 g/dL (ref 30.0–36.0)
MCV: 93.5 fL (ref 80.0–100.0)
Platelets: 222 10*3/uL (ref 150–400)
RBC: 3.68 MIL/uL — ABNORMAL LOW (ref 3.87–5.11)
RDW: 13.2 % (ref 11.5–15.5)
WBC: 11.6 10*3/uL — ABNORMAL HIGH (ref 4.0–10.5)
nRBC: 0 % (ref 0.0–0.2)

## 2020-12-23 LAB — BASIC METABOLIC PANEL
Anion gap: 10 (ref 5–15)
BUN: 27 mg/dL — ABNORMAL HIGH (ref 8–23)
CO2: 24 mmol/L (ref 22–32)
Calcium: 9.1 mg/dL (ref 8.9–10.3)
Chloride: 105 mmol/L (ref 98–111)
Creatinine, Ser: 1 mg/dL (ref 0.44–1.00)
GFR, Estimated: 56 mL/min — ABNORMAL LOW (ref >60)
Glucose, Bld: 153 mg/dL — ABNORMAL HIGH (ref 70–99)
Potassium: 6.1 mmol/L — ABNORMAL HIGH (ref 3.5–5.1)
Sodium: 139 mmol/L (ref 135–145)

## 2020-12-23 LAB — TROPONIN I (HIGH SENSITIVITY)
Troponin I (High Sensitivity): 7 ng/L (ref <18)
Troponin I (High Sensitivity): 7 ng/L (ref <18)

## 2020-12-23 MED ORDER — DEXAMETHASONE SODIUM PHOSPHATE 10 MG/ML IJ SOLN
6.0000 mg | Freq: Once | INTRAMUSCULAR | Status: AC
  Administered 2020-12-23: 6 mg via INTRAVENOUS
  Filled 2020-12-23: qty 0.6

## 2020-12-23 MED ORDER — DEXTROSE 50 % IV SOLN
1.0000 | Freq: Once | INTRAVENOUS | Status: AC
  Administered 2020-12-23: 50 mL via INTRAVENOUS
  Filled 2020-12-23: qty 50

## 2020-12-23 MED ORDER — GABAPENTIN 100 MG PO CAPS
100.0000 mg | ORAL_CAPSULE | Freq: Three times a day (TID) | ORAL | Status: DC
  Administered 2020-12-23 – 2020-12-24 (×4): 100 mg via ORAL
  Filled 2020-12-23 (×5): qty 1

## 2020-12-23 MED ORDER — IBUPROFEN 400 MG PO TABS
600.0000 mg | ORAL_TABLET | Freq: Three times a day (TID) | ORAL | Status: DC
  Administered 2020-12-23: 600 mg via ORAL
  Filled 2020-12-23: qty 2

## 2020-12-23 MED ORDER — ATORVASTATIN CALCIUM 20 MG PO TABS
40.0000 mg | ORAL_TABLET | Freq: Every day | ORAL | Status: DC
  Administered 2020-12-23 – 2020-12-25 (×3): 40 mg via ORAL
  Filled 2020-12-23 (×3): qty 2

## 2020-12-23 MED ORDER — SODIUM ZIRCONIUM CYCLOSILICATE 10 G PO PACK
10.0000 g | PACK | Freq: Every day | ORAL | Status: DC
  Administered 2020-12-23 – 2020-12-24 (×2): 10 g via ORAL
  Filled 2020-12-23 (×3): qty 1

## 2020-12-23 MED ORDER — ENOXAPARIN SODIUM 40 MG/0.4ML IJ SOSY
40.0000 mg | PREFILLED_SYRINGE | INTRAMUSCULAR | Status: DC
  Administered 2020-12-23 – 2020-12-25 (×3): 40 mg via SUBCUTANEOUS
  Filled 2020-12-23 (×3): qty 0.4

## 2020-12-23 MED ORDER — DEXAMETHASONE 4 MG PO TABS
4.0000 mg | ORAL_TABLET | Freq: Four times a day (QID) | ORAL | Status: AC
  Administered 2020-12-23 – 2020-12-24 (×4): 4 mg via ORAL
  Filled 2020-12-23 (×4): qty 1

## 2020-12-23 MED ORDER — INSULIN ASPART 100 UNIT/ML IV SOLN
10.0000 [IU] | Freq: Once | INTRAVENOUS | Status: AC
  Administered 2020-12-23: 10 [IU] via INTRAVENOUS
  Filled 2020-12-23: qty 0.1

## 2020-12-23 MED ORDER — ASPIRIN EC 81 MG PO TBEC
81.0000 mg | DELAYED_RELEASE_TABLET | Freq: Every day | ORAL | Status: DC
  Administered 2020-12-23 – 2020-12-25 (×3): 81 mg via ORAL
  Filled 2020-12-23 (×3): qty 1
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
## 2020-12-23 NOTE — Plan of Care (Signed)

## 2020-12-23 NOTE — Progress Notes (Signed)
Physical Therapy Treatment Patient Details Name: Kelly Ritter MRN: [id number] DOB: [birth year] Today's Date: 12/23/2020    History of Present Illness Pt is an 87 yo female diagnosed with cauda equina syndrome and is s/p left L4-5 microdiscectomy and bilateral decompression. PMH includes: MI, CAD s/p stent placement, HTN, anxiety, CVA, R ankle fusion, hiatal hernia, and skin CA.    PT Comments    Pt was pleasant and motivated to participate during the session and made good progress towards goals this session.  Pt required no physical assistance with transfers or gait and was able to increase her amb distance to 150+ feet this session.  Pt was antalgic on the RLE during ambulation but steady without LOB.  No adverse symptoms noted during the session other than RLE pain with SpO2 and HR WNL.  Pt will benefit from HHPT upon discharge to safely address deficits listed in patient problem list for decreased caregiver assistance and eventual return to PLOF.     Follow Up Recommendations  Home health PT;Supervision/Assistance - 24 hour     Equipment Recommendations  None recommended by PT    Recommendations for Other Services       Precautions / Restrictions Precautions Precautions: Fall Restrictions Weight Bearing Restrictions: No Other Position/Activity Restrictions: No back brace required    Mobility  Bed Mobility               General bed mobility comments: NT, pt found sitting at EOB    Transfers Overall transfer level: Needs assistance Equipment used: Rolling walker (2 wheeled) Transfers: Sit to/from Stand Sit to Stand: Min guard         General transfer comment: Extra time and effort required to come to standing but no physical assistance needed  Ambulation/Gait Ambulation/Gait assistance: Min guard Gait Distance (Feet): 150 Feet Assistive device: Rolling walker (2 wheeled) Gait Pattern/deviations: Step-to pattern;Antalgic;Decreased stance time -  right;Step-through pattern Gait velocity: decreased   General Gait Details: Antalgic on the RLE with primarily step-to pattern; pt attempted step-through pattern but quickly reverted back to step-to pattern for RLE pain control; mod verbal cues for amb closer to the RW with upright posture; pt steady without LOB   Stairs             Wheelchair Mobility    Modified Rankin (Stroke Patients Only)       Balance Overall balance assessment: Needs assistance   Sitting balance-Leahy Scale: Normal     Standing balance support: Bilateral upper extremity supported;During functional activity Standing balance-Leahy Scale: Fair Standing balance comment: Min to mod lean on the RW for support                            Cognition Arousal/Alertness: Awake/alert Behavior During Therapy: WFL for tasks assessed/performed Overall Cognitive Status: Within Functional Limits for tasks assessed                                        Exercises Total Joint Exercises Ankle Circles/Pumps: AROM;Strengthening;Both;10 reps Quad Sets: Strengthening;Both;10 reps Gluteal Sets: Strengthening;Both;10 reps Long Arc Quad: AROM;Strengthening;Both;10 reps Knee Flexion: AROM;Strengthening;Both;10 reps Marching in Standing: AROM;Strengthening;Both;5 reps;Standing Other Exercises Other Exercises: HEP education and review for BLE APs, QS, and GS x 10 each every 1-2 hours daily    General Comments        Pertinent Vitals/Pain Pain Assessment:  0-10 Pain Score: 8  Pain Location: R lower back and RLE Pain Descriptors / Indicators: Sore Pain Intervention(s): Premedicated before session;Monitored during session;Repositioned    Home Living                      Prior Function            PT Goals (current goals can now be found in the care plan section) Progress towards PT goals: Progressing toward goals    Frequency    7X/week      PT Plan Current plan  remains appropriate    Co-evaluation              AM-PAC PT "6 Clicks" Mobility   Outcome Measure  Help needed turning from your back to your side while in a flat bed without using bedrails?: A Little Help needed moving from lying on your back to sitting on the side of a flat bed without using bedrails?: A Little Help needed moving to and from a bed to a chair (including a wheelchair)?: A Little Help needed standing up from a chair using your arms (e.g., wheelchair or bedside chair)?: A Little Help needed to walk in hospital room?: A Little Help needed climbing 3-5 steps with a railing? : A Little 6 Click Score: 18    End of Session Equipment Utilized During Treatment: Gait belt Activity Tolerance: Patient tolerated treatment well Patient left: in chair;with call bell/phone within reach;with chair alarm set;with SCD's reapplied Nurse Communication: Mobility status PT Visit Diagnosis: Unsteadiness on feet (R26.81);Other abnormalities of gait and mobility (R26.89);Muscle weakness (generalized) (M62.81);Pain Pain - Right/Left: Right Pain - part of body: Leg     Time: 1014-1040 PT Time Calculation (min) (ACUTE ONLY): 26 min  Charges:  $Gait Training: 8-22 mins $Therapeutic Exercise: 8-22 mins                     D. Scott Lacee Grey PT, DPT 12/23/20, 10:58 AM

## 2020-12-23 NOTE — Anesthesia Postprocedure Evaluation (Signed)
Anesthesia Post Note  Patient: Kelly Ritter  Procedure(s) Performed: L4-5 DISCECTOMY (N/A Back)  Patient location during evaluation: PACU Anesthesia Type: General Level of consciousness: awake and alert Pain management: pain level controlled Vital Signs Assessment: post-procedure vital signs reviewed and stable Respiratory status: spontaneous breathing, nonlabored ventilation, respiratory function stable and patient connected to nasal cannula oxygen Cardiovascular status: blood pressure returned to baseline and stable Postop Assessment: no apparent nausea or vomiting Anesthetic complications: no   No complications documented.   Last Vitals:  Vitals:   12/23/20 0424 12/23/20 0745  BP: (!) 121/53 (!) 124/55  Pulse: 76 69  Resp:  17  Temp: 36.7 C 36.8 C  SpO2: 94% 93%    Last Pain:  Vitals:   12/23/20 0624  TempSrc:   PainSc: [place] Verlin Uher

## 2020-12-23 NOTE — Progress Notes (Signed)
Pt c/o "chest heaviness" 6/10 past 30 min.  States it .  Denies SOB, nausea, diaphoresis or CP.  BP 121/46, P 84, O2 sat 96% on RA. MD paged at this time.

## 2020-12-23 NOTE — TOC Initial Note (Signed)
Transition of Care [facility]) - Initial/Assessment Note    Patient Details  Name: Kelly Ritter MRN: [id number] Date of Birth: Dec 29, 1933  [facility]) CM/SW Contact:    Boris Sharper, LCSW Phone Number: 12/23/2020, 3:27 PM  Clinical Narrative:                 CSW spoke with pt regarding discharge plans and PT recommendations. Pt and family were agreeable to [facility] and did not have a preference in agencies. CSW gave referral to [facility] and [facility] Cornea notified CSW that they are able to accept. No DME required at this time.   Expected Discharge Plan: [place] Barriers to Discharge: Continued Medical Work up   Patient Goals and CMS Choice   CMS Medicare.gov Compare Post Acute Care list provided to:: Patient Choice offered to / list presented to : Patient  Expected Discharge Plan and Services Expected Discharge Plan: [place] Choice: [place] arrangements for the past 2 months: [place]: PT [place] Agency: [place] ([place]) Date [place]: 12/23/20 Time [place]: [id number] Representative spoke with at [place]: Floydene Flock  Prior Living Arrangements/Services Living arrangements for the past 2 months: [place] Lives with:: Adult Children Patient language and need for interpreter reviewed:: Yes Do you feel safe going back to the place where you live?: Yes      Need for Family Participation in Patient Care: Yes (Comment) Care giver support system in place?: Yes (comment)   Criminal Activity/Legal Involvement Pertinent to Current Situation/Hospitalization: No - Comment as needed  Activities of Daily Living Home Assistive Devices/Equipment: Walker (specify type),Built-in shower seat ADL Screening (condition at time of admission) Patient's cognitive ability adequate to safely complete daily activities?: Yes Is the  patient deaf or have difficulty hearing?: No Does the patient have difficulty seeing, even when wearing glasses/contacts?: No Does the patient have difficulty concentrating, remembering, or making decisions?: No Patient able to express need for assistance with ADLs?: Yes Does the patient have difficulty dressing or bathing?: No Independently performs ADLs?: Yes (appropriate for developmental age) Does the patient have difficulty walking or climbing stairs?: Yes Weakness of Legs: Both Weakness of Arms/Hands: None  Permission Sought/Granted Permission sought to share information with : Facility Art therapist granted to share information with : Yes, Verbal Permission Granted  Share Information with NAME: Nira Conn     Permission granted to share info w Relationship: daughter     Emotional Assessment Appearance:: Other (Comment Required (unable to assess) Attitude/Demeanor/Rapport: Unable to Assess Affect (typically observed): Unable to Assess Orientation: : Oriented to Self,Oriented to Place,Oriented to  Time,Oriented to Situation Alcohol / Substance Use: Not Applicable Psych Involvement: No (comment)  Admission diagnosis:  Cauda equina compression [facility]) [G83.4] Patient Active Problem List   Diagnosis Date Noted  . Hyperkalemia 12/23/2020  . Cauda equina compression ([place]) 12/21/2020  . Spinal stenosis of lumbar region with neurogenic claudication 06/22/2020  . Status post total right knee replacement 03/19/2020  . Restless legs syndrome 01/19/2019  . Mood disorder ([place]) 01/19/2019  . Peripheral polyneuropathy 11/17/2018  . 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[place]) 08/18/2018  . Bilateral carpal tunnel syndrome 08/18/2018  . Mixed hyperlipidemia 07/26/2018  . GERD (gastroesophageal reflux disease)  07/26/2018  . Osteoporosis of forearm 04/02/2018  . B12 deficiency 08/14/2017  . Prediabetes 08/13/2017  . 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 HTN (hypertension) 05/06/2017  . SOBOE (shortness of breath on exertion) 02/26/2017  . Anxiety, generalized 02/03/2017  . Post-concussion headache 02/03/2017  . Elevated TSH 10/22/2016  . 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[place]) 10/22/2016  . Primary osteoarthritis of hand 01/09/2016  . Arthralgia of multiple sites 12/18/2015  . RSD (reflex sympathetic dystrophy) 12/18/2015  . Angina pectoris ([place]) 09/24/2015  . Personal history of other malignant neoplasm of skin 08/02/2015  . Benign essential hypertension 01/06/2015  . CVA (cerebral infarction) 12/12/2014  . Bilateral carotid artery stenosis 10/21/2014  . Other allergic rhinitis 10/21/2014  . Paroxysmal supraventricular tachycardia ([place]) 07/05/2014   PCP:  Gennette Pac, NP Pharmacy:   [facility] [phone] - [address], [address] AT [address] [place] [address] Phone: [phone] Fax: [phone]     Social Determinants of Health (SDOH) Interventions    Readmission Risk Interventions No flowsheet data found.

## 2020-12-23 NOTE — Progress Notes (Signed)
    Attending Progress Note  History: Kelly Ritter is here for cauda equina syndrome.  DOS 12/22/20 - L L4-5 microdiscectomy  POD1: Still having some leg pain.  Her bladder control is improved.  Physical Exam: Vitals:   12/23/20 0424 12/23/20 0745  BP: (!) 121/53 (!) 124/55  Pulse: 76 69  Resp:  17  Temp: 98.1 F (36.7 C) 98.2 F (36.8 C)  SpO2: 94% 93%    AA Ox3 CNI  Strength:5/5 throughout BLE. SILT  R ankle fused - cannot assess strength  Data:  Recent Labs  Lab 12/21/20 1842  NA 140  K 5.1  CL 102  CO2 28  BUN 16  CREATININE 0.93  GLUCOSE 92  CALCIUM 9.3   No results for input(s): AST, ALT, ALKPHOS in the last 168 hours.  Invalid input(s): TBILI   Recent Labs  Lab 12/21/20 1842  WBC 7.1  HGB 12.1  HCT 37.7  PLT 239   Recent Labs  Lab 12/21/20 1842  APTT 31  INR 1.0         Other tests/results: none to review  Assessment/Plan:  Kelly Ritter is doing well after microdiscectomy for cauda equina syndrome.  Her perineal sensation and bladder function is improved.  - mobilize - pain control - DVT prophylaxis - PTOT - steroids for radicular irritation   Meade Maw MD, [facility] Department of Neurosurgery

## 2020-12-24 ENCOUNTER — Encounter: Payer: Self-pay | Admitting: Neurosurgery

## 2020-12-24 ENCOUNTER — Inpatient Hospital Stay
Admission: AD | Admit: 2020-12-24 | Discharge: 2020-12-24 | Disposition: A | Discharge status: Home / Self Care | Payer: Medicare Other | Source: Ambulatory Visit | Attending: Internal Medicine | Admitting: Internal Medicine

## 2020-12-24 DIAGNOSIS — I209 Angina pectoris, unspecified: Secondary | ICD-10-CM | POA: Diagnosis not present

## 2020-12-24 LAB — BASIC METABOLIC PANEL
Anion gap: 9 (ref 5–15)
BUN: 25 mg/dL — ABNORMAL HIGH (ref 8–23)
CO2: 26 mmol/L (ref 22–32)
Calcium: 9.2 mg/dL (ref 8.9–10.3)
Chloride: 105 mmol/L (ref 98–111)
Creatinine, Ser: 0.88 mg/dL (ref 0.44–1.00)
GFR, Estimated: >60 mL/min (ref >60)
Glucose, Bld: 151 mg/dL — ABNORMAL HIGH (ref 70–99)
Potassium: 5.2 mmol/L — ABNORMAL HIGH (ref 3.5–5.1)
Sodium: 140 mmol/L (ref 135–145)

## 2020-12-24 MED ORDER — ACETAMINOPHEN 500 MG PO TABS
1000.0000 mg | ORAL_TABLET | Freq: Four times a day (QID) | ORAL | Status: DC
  Administered 2020-12-24 – 2020-12-25 (×4): 1000 mg via ORAL
  Filled 2020-12-24 (×4): qty 2

## 2020-12-24 MED ORDER — GABAPENTIN 300 MG PO CAPS
300.0000 mg | ORAL_CAPSULE | Freq: Three times a day (TID) | ORAL | Status: DC
  Administered 2020-12-24 – 2020-12-25 (×3): 300 mg via ORAL
  Filled 2020-12-24 (×3): qty 1
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
## 2020-12-24 NOTE — Plan of Care (Signed)

## 2020-12-24 NOTE — Progress Notes (Signed)
    Attending Progress Note  History: Kelly Ritter is here for cauda equina syndrome.  DOS 12/22/20 - L L4-5 microdiscectomy  POD2: Leg pain continues.  Sensation improved.  POD1: Still having some leg pain.  Her bladder control is improved.  Physical Exam: Vitals:   12/24/20 0518 12/24/20 0754  BP: 137/73 (!) 143/82  Pulse: 77 74  Resp: 18 18  Temp: 97.7 F (36.5 C) (!) 97.5 F (36.4 C)  SpO2: 95% 97%    AA Ox3 CNI  Strength:5/5 throughout BLE. R ankle fused, so no exam for DF or PF on R. SILT  R ankle fused - cannot assess strength  Data:  Recent Labs  Lab 12/21/20 1842 12/23/20 1337 12/24/20 0537  NA 140 139 140  K 5.1 6.1* 5.2*  CL 102 105 105  CO2 28 24 26   BUN 16 27* 25*  CREATININE 0.93 1.00 0.88  GLUCOSE 92 153* 151*  CALCIUM 9.3 9.1 9.2   No results for input(s): AST, ALT, ALKPHOS in the last 168 hours.  Invalid input(s): TBILI   Recent Labs  Lab 12/21/20 1842 12/23/20 1337  WBC 7.1 11.6*  HGB 12.1 11.0*  HCT 37.7 34.4*  PLT 239 222   Recent Labs  Lab 12/21/20 1842  APTT 31  INR 1.0         Other tests/results: none to review  Assessment/Plan:  Kelly Ritter is doing well after microdiscectomy for cauda equina syndrome.  Her perineal sensation and bladder function is improved.  - mobilize - pain control - DVT prophylaxis - PTOT - steroids for radicular irritation - Dispo after stabilized from medical/cardiology view   Meade Maw MD, [facility] Department of Neurosurgery

## 2020-12-24 NOTE — Progress Notes (Signed)
Phone call from monitors.  Pt had episode of 11 beats VT @ 1609.  Pt resting quietly in bed without complaint. Rhythm now is NSR with HR 80's.  MD notified.

## 2020-12-24 NOTE — Progress Notes (Signed)
Consult PROGRESS NOTE    Kelly Ritter  UYQ:[id number] DOB: 1934/04/21 DOA: 12/21/2020 PCP: Gennette Pac, NP  143A/143A-AA   Assessment & Plan:   Principal Problem:   Angina pectoris ([place]) Active Problems:   HTN (hypertension)   Benign essential hypertension   GERD (gastroesophageal reflux disease)   Cauda equina compression (HCC)   Hyperkalemia   Kelly Ritter is a 87 y.o. female with medical history significant for coronary artery disease status post stent angioplasty, dyslipidemia, hiatal hernia, hypertension, history of CVA, who is status post L4/5 microdiscectomy and bilateral decompression on 12/22/20 for cauda equina syndrome.  Medical consult has been requested for evaluation of chest pain and hyperkalemia.  Chest pain Hx of CAD s/p stent Hx of cardiac amyloidosis  --trop neg.  Per cardiology, "unchanged EKG and intermittent mild chest pain noncardiac in nature" Plan: --no further cardiac workup recommended  --cont tele for now  Hyperkalemia, improved Most likely medication induced Patient does not have any EKG changes --Treated with dextrose, insulin and Lokelma Plan: --cont Lokelma --Hold home Lisinopril  GERD history of hiatal hernia --cont PPI  Status post L4/5 microdiscectomy for cauda equina Persistent pain --Further treatment plan per neurosurgery --pain control per neurosurgery --Pt may need outpatient followup with pain specialist   DVT prophylaxis: Lovenox SQ Code Status: DNR  Family Communication:  Level of care: Med-Surg Dispo:   The patient is from: home Anticipated d/c is to: home Anticipated d/c date is: per neurosurgery   Subjective and Interval History:  Pt reported pain down her legs that did not improve after her surgery.     Objective: Vitals:   12/24/20 0518 12/24/20 0754 12/24/20 1540 12/24/20 2059  BP: 137/73 (!) 143/82 (!) 127/57 (!) 126/53  Pulse: 77 74 74 80  Resp: 18 18 18 16   Temp: 97.7 F (36.5 C)  (!) 97.5 F (36.4 C) 98 F (36.7 C) 98.7 F (37.1 C)  TempSrc: Oral     SpO2: 95% 97% 94% 94%  Weight:      Height:        Intake/Output Summary (Last 24 hours) at 12/25/2020 0211 Last data filed at 12/24/2020 1819 Gross per 24 hour  Intake 600 ml  Output 700 ml  Net -100 ml   Filed Weights   12/22/20 0900  Weight: 68.9 kg    Examination:   Constitutional: NAD, AAOx3 HEENT: conjunctivae and lids normal, EOMI CV: No cyanosis.   RESP: normal respiratory effort, on RA SKIN: warm, dry Neuro: II - XII grossly intact.   Psych: Normal mood and affect.  Appropriate judgement and reason   Data Reviewed: I have personally reviewed following labs and imaging studies  CBC: Recent Labs  Lab 12/21/20 1842 12/23/20 1337  WBC 7.1 11.6*  HGB 12.1 11.0*  HCT 37.7 34.4*  MCV 92.2 93.5  PLT 239 638   Basic Metabolic Panel: Recent Labs  Lab 12/21/20 1842 12/23/20 1337 12/24/20 0537  NA 140 139 140  K 5.1 6.1* 5.2*  CL 102 105 105  CO2 28 24 26   GLUCOSE 92 153* 151*  BUN 16 27* 25*  CREATININE 0.93 1.00 0.88  CALCIUM 9.3 9.1 9.2   GFR: Estimated Creatinine Clearance: 46.4 mL/min (by C-G formula based on SCr of 0.88 mg/dL). Liver Function Tests: No results for input(s): AST, ALT, ALKPHOS, BILITOT, PROT, ALBUMIN in the last 168 hours. No results for input(s): LIPASE, AMYLASE in the last 168 hours. No results for input(s): AMMONIA in the last  168 hours. Coagulation Profile: Recent Labs  Lab 12/21/20 1842  INR 1.0   Cardiac Enzymes: No results for input(s): CKTOTAL, CKMB, CKMBINDEX, TROPONINI in the last 168 hours. BNP (last 3 results) No results for input(s): PROBNP in the last 8760 hours. HbA1C: No results for input(s): HGBA1C in the last 72 hours. CBG: Recent Labs  Lab 12/22/20 1836  GLUCAP 204*   Lipid Profile: No results for input(s): CHOL, HDL, LDLCALC, TRIG, CHOLHDL, LDLDIRECT in the last 72 hours. Thyroid Function Tests: No results for input(s):  TSH, T4TOTAL, FREET4, T3FREE, THYROIDAB in the last 72 hours. Anemia Panel: No results for input(s): VITAMINB12, FOLATE, FERRITIN, TIBC, IRON, RETICCTPCT in the last 72 hours. Sepsis Labs: No results for input(s): PROCALCITON, LATICACIDVEN in the last 168 hours.  Recent Results (from the past 240 hour(s))  SARS CORONAVIRUS 2 (TAT 6-24 HRS) Nasopharyngeal Nasopharyngeal Swab     Status: None   Collection Time: 12/21/20  6:47 PM   Specimen: Nasopharyngeal Swab  Result Value Ref Range Status   SARS Coronavirus 2 NEGATIVE NEGATIVE Final    Comment: (NOTE) SARS-CoV-2 target nucleic acids are NOT DETECTED.  The SARS-CoV-2 RNA is generally detectable in upper and lower respiratory specimens during the acute phase of infection. Negative results do not preclude SARS-CoV-2 infection, do not rule out co-infections with other pathogens, and should not be used as the sole basis for treatment or other patient management decisions. Negative results must be combined with clinical observations, patient history, and epidemiological information. The expected result is Negative.  Fact Sheet for Patients: SugarRoll.be  Fact Sheet for Healthcare Providers: https://www.woods-mathews.com/  This test is not yet approved or cleared by the [place] FDA and  has been authorized for detection and/or diagnosis of SARS-CoV-2 by FDA under an Emergency Use Authorization (EUA). This EUA will remain  in effect (meaning this test can be used) for the duration of the COVID-19 declaration under Se ction 564(b)(1) of the Act, 21 U.S.C. section 360bbb-3(b)(1), unless the authorization is terminated or revoked sooner.  Performed at [facility], [place] [address], [place], [place] [address]   MRSA PCR Screening     Status: None   Collection Time: 12/21/20  6:47 PM   Specimen: Nasal Mucosa; Nasopharyngeal  Result Value Ref Range Status   MRSA by PCR NEGATIVE  NEGATIVE Final    Comment:        The GeneXpert MRSA Assay (FDA approved for NASAL specimens only), is one component of a comprehensive MRSA colonization surveillance program. It is not intended to diagnose MRSA infection nor to guide or monitor treatment for MRSA infections. Performed at [facility], [address] [place], [place] [address]       Radiology Studies: No results found.   Scheduled Meds: . acetaminophen  1,000 mg Oral Q6H  . aspirin EC  81 mg Oral Daily  . atorvastatin  40 mg Oral Daily  . DULoxetine  40 mg Oral QHS  . enoxaparin (LOVENOX) injection  40 mg Subcutaneous Q24H  . gabapentin  300 mg Oral TID  . loratadine  10 mg Oral QHS  . metoprolol succinate  12.5 mg Oral QHS  . pantoprazole  40 mg Oral QAC breakfast  . rOPINIRole  0.25 mg Oral QHS  . sodium chloride flush  3 mL Intravenous Q12H  . sodium zirconium cyclosilicate  10 g Oral Daily   Continuous Infusions: . sodium chloride Stopped (12/23/20 1121)  . methocarbamol (ROBAXIN) IV  LOS: 4 days     Enzo Bi, MD [facility] If 7PM-7AM, please contact night-coverage 12/25/2020, 2:11 AM

## 2020-12-24 NOTE — Progress Notes (Signed)
*  PRELIMINARY RESULTS* Echocardiogram 2D Echocardiogram has been performed.  Kelly Ritter [name] 12/24/2020, 1:11 PM

## 2020-12-24 NOTE — Progress Notes (Signed)
PT Cancellation Note  Patient Details Name: Kelly Ritter MRN: [id number] DOB: October 01, 1933   Cancelled Treatment:    Reason Eval/Treat Not Completed: Medical issues which prohibited therapy   Potassium 5.2 this am.  Outside parameters for PT treat.  Will hold per protocol and anticipate continued therapy tomorrow.   Chesley Noon 12/24/2020, 11:00 AM

## 2020-12-25 LAB — BASIC METABOLIC PANEL
Anion gap: 8 (ref 5–15)
BUN: 27 mg/dL — ABNORMAL HIGH (ref 8–23)
CO2: 26 mmol/L (ref 22–32)
Calcium: 8.5 mg/dL — ABNORMAL LOW (ref 8.9–10.3)
Chloride: 105 mmol/L (ref 98–111)
Creatinine, Ser: 0.82 mg/dL (ref 0.44–1.00)
GFR, Estimated: >60 mL/min (ref >60)
Glucose, Bld: 106 mg/dL — ABNORMAL HIGH (ref 70–99)
Potassium: 3.9 mmol/L (ref 3.5–5.1)
Sodium: 139 mmol/L (ref 135–145)

## 2020-12-25 LAB — ECHOCARDIOGRAM COMPLETE
AR max vel: 1.38 cm2
AV Peak grad: 14.4 mmHg
Ao pk vel: 1.9 m/s
Area-P 1/2: 3.63 cm2
Height: 65 in
S' Lateral: 2.78 cm
Weight: 2432 oz

## 2020-12-25 LAB — CBC
HCT: 32.6 % — ABNORMAL LOW (ref 36.0–46.0)
Hemoglobin: 11 g/dL — ABNORMAL LOW (ref 12.0–15.0)
MCH: 30.6 pg (ref 26.0–34.0)
MCHC: 33.7 g/dL (ref 30.0–36.0)
MCV: 90.6 fL (ref 80.0–100.0)
Platelets: 229 10*3/uL (ref 150–400)
RBC: 3.6 MIL/uL — ABNORMAL LOW (ref 3.87–5.11)
RDW: 13.4 % (ref 11.5–15.5)
WBC: 9.2 10*3/uL (ref 4.0–10.5)
nRBC: 0 % (ref 0.0–0.2)

## 2020-12-25 LAB — MAGNESIUM: Magnesium: 1.8 mg/dL (ref 1.7–2.4)

## 2020-12-25 MED ORDER — FLEET ENEMA 7-19 GM/118ML RE ENEM
1.0000 | ENEMA | Freq: Once | RECTAL | Status: AC
  Administered 2020-12-25: 1 via RECTAL

## 2020-12-25 MED ORDER — GABAPENTIN 300 MG PO CAPS: 300.0000 mg | ORAL_CAPSULE | Freq: Three times a day (TID) | ORAL | 3 refills | Status: DC

## 2020-12-25 MED ORDER — METOPROLOL SUCCINATE ER 25 MG PO TB24: 12.5000 mg | ORAL_TABLET | Freq: Every day | ORAL | 3 refills | Status: DC

## 2020-12-25 MED ORDER — OXYCODONE HCL 5 MG PO TABS: 5.0000 mg | ORAL_TABLET | ORAL | 0 refills | Status: DC | PRN

## 2020-12-25 MED ORDER — ATORVASTATIN CALCIUM 20 MG PO TABS: 40.0000 mg | ORAL_TABLET | Freq: Every day | ORAL | 3 refills | Status: DC

## 2020-12-25 MED ORDER — METHOCARBAMOL 500 MG PO TABS: 500.0000 mg | ORAL_TABLET | Freq: Four times a day (QID) | ORAL | 3 refills | Status: DC | PRN

## 2020-12-25 MED ORDER — ASPIRIN 81 MG PO TBEC: 81.0000 mg | DELAYED_RELEASE_TABLET | Freq: Every day | ORAL | 11 refills | Status: AC

## 2020-12-25 MED ORDER — SENNOSIDES-DOCUSATE SODIUM 8.6-50 MG PO TABS: 1.0000 | ORAL_TABLET | Freq: Every evening | ORAL | 2 refills | Status: DC | PRN

## 2020-12-25 NOTE — Discharge Instructions (Signed)
NEUROSURGERY DISCHARGE INSTRUCTIONS  Admission diagnosis: Cauda equina compression ([place]) [G83.4]  Operative procedure: L4/5 hemilaminectomy and discectomy  What to do after you leave the hospital:  Recommended diet: low fat, low cholesterol diet. Increase protein intake to promote wound healing.  Recommended activity: no lifting or strenuous exercise for 6 weeks. You should walk multiple times per day  Special Instructions  No straining, no heavy lifting > 10lbs x 4 weeks.  Keep incision area clean and dry. May shower in 2 days. No baths or pools for 6 weeks.  Please remove dressing, no need to apply a bandage afterwards  You have no sutures to remove, the skin is closed with adhesive  Please take pain medications as directed. Take a stool softener if on pain medications   Please Report any of the following: Nausea or Vomiting, Temperature is greater than 101.73F (38.1C) degrees, Dizziness, Abdominal Pain, Difficulty Breathing or Shortness of Breath, Inability to Eat, drink Fluids, or Take medications, Bleeding, swelling, or drainage from surgical incision sites, New numbness or weakness, and Bowel or bladder dysfunction to the neurosurgeon on call at [phone]  Additional Follow up appointments Please follow up with Dr Izora Ribas in [facility] as scheduled in 2-3 weeks

## 2020-12-25 NOTE — Progress Notes (Signed)
Pt provided discharge instructions and 3in1 BSC. Son accompanied pt at discharge.     12/25/20 1517  Vitals  Temp 98.1 F (36.7 C)  Temp Source Oral  BP (!) 158/72  BP Location Left Arm  BP Method Automatic  Patient Position (if appropriate) Sitting  Pulse Rate 74  Pulse Rate Source Monitor  Resp 16  Level of Consciousness  Level of Consciousness Alert  MEWS COLOR  MEWS Score Color Green  Oxygen Therapy  SpO2 98 %  O2 Device Room Air

## 2020-12-25 NOTE — Discharge Summary (Signed)
Physician Discharge Summary  Patient ID: Kelly Ritter MRN: [id number] DOB/AGE: 85-Nov-1935 85 y.o.  Admit date: 12/21/2020 Discharge date: 12/25/2020  Admission Diagnoses:  Discharge Diagnoses:  Principal Problem:   Angina pectoris ([place]) Active Problems:   HTN (hypertension)   Benign essential hypertension   GERD (gastroesophageal reflux disease)   Cauda equina compression (HCC)   Hyperkalemia   Discharged Condition: good  Hospital Course:  Ms. Brilyn Tuller was admitted with a chief complaint of low back pain with radiation into the bilateral buttock and posterior thighs stopping at the knee. She reports urinary and bowel incontinence and diminished sensation in her saddle area over the previous days.She has been having problems for years, but consistent issues for the past 9 months. She now has pain as bad as 10 out of 10 in her buttocks that extends down her legs. She has numbness in her perineal region as well as in her legs. She gets pain immediately when she stands or walks. She has noticed increasing trouble controlling her bowel bladder over the past week to 2 weeks. She has had accidents.  She had an MRI that showed stenosis at L4-5 and she was taken for surgical decompression consisting of a L4-5 hemilaminectomy on 5/13.  Following the surgery she was taken to the ward for postoperative care where she did have improved sensation and was able to void spontaneously.  She was dealing with significant leg pain which was treated with medication and steroids.  She worked with physical therapy over the next couple of days and progressed to the point where they recommended home discharge.  On 5/15, she did have some chest pain and EKG and troponins were unremarkable.  No further cardiac work-up was recommended.  On 5/16, she did have a bowel movement and was at her baseline neurologic exam with full strength.  Given she was meeting all discharge criteria, she was discharged  home  Consults: Hospitalist   Discharge Exam: Blood pressure (!) 146/67, pulse 72, temperature 97.9 F (36.6 C), resp. rate 16, height 5\' 5"  (1.651 m), weight 68.9 kg, SpO2 96 %. Alert, awake  Strength:5/5 throughout BLE. R ankle fused, so no exam for DF or PF on R. Sensation intact in BLE  R ankle fused - cannot assess strength  Disposition: Discharge disposition: 01-Home or Self Care       Discharge Instructions    Diet - low sodium heart healthy   Complete by: As directed    Incentive spirometry RT   Complete by: As directed    Increase activity slowly   Complete by: As directed      Allergies as of 12/25/2020      Reactions   Sulfa Antibiotics Hives, Itching, Swelling, Rash   Baclofen Other (See Comments)   Muscle spasms and cramps in shoulder area   Latex Rash, Hives, Other (See Comments)   Skin eruptions       Medication List    STOP taking these medications   oxyCODONE-acetaminophen 5-325 MG tablet Commonly known as: PERCOCET/ROXICET   rosuvastatin 20 MG tablet Commonly known as: CRESTOR   traMADol 50 MG tablet Commonly known as: ULTRAM     TAKE these medications   acetaminophen 650 MG CR tablet Commonly known as: TYLENOL Take 1,300 mg by mouth at bedtime as needed for pain.   aspirin 81 MG EC tablet Take 1 tablet (81 mg total) by mouth daily. Swallow whole. Start taking on: Dec 26, 2020   atorvastatin 20 MG tablet  Commonly known as: LIPITOR Take 2 tablets (40 mg total) by mouth daily. Start taking on: Dec 26, 2020   B-12 500 MCG Tabs Take 1 tablet by mouth daily. What changed: how much to take   Cholecalciferol 25 MCG (1000 UT) tablet Take 1,000 Units by mouth daily.   clopidogrel 75 MG tablet Commonly known as: PLAVIX Take 75 mg by mouth at bedtime.   diflunisal 500 MG Tabs tablet Commonly known as: DOLOBID Take 0.5 tablets by mouth in the morning and at bedtime.   docusate sodium 100 MG capsule Commonly known as:  Colace Take 1 capsule (100 mg total) by mouth 2 (two) times daily. What changed:   when to take this  reasons to take this   DULoxetine HCl 40 MG Cpep Take 40 mg by mouth at bedtime. What changed: Another medication with the same name was removed. Continue taking this medication, and follow the directions you see here.   fluticasone 50 MCG/ACT nasal spray Commonly known as: FLONASE Place 1 spray into both nostrils daily as needed for allergies.   gabapentin 300 MG capsule Commonly known as: NEURONTIN Take 1 capsule (300 mg total) by mouth 3 (three) times daily. What changed:   medication strength  how much to take  when to take this   levocetirizine 5 MG tablet Commonly known as: XYZAL Take 5 mg by mouth every evening.   lisinopril 10 MG tablet Commonly known as: ZESTRIL Take 10 mg by mouth daily.   methocarbamol 500 MG tablet Commonly known as: ROBAXIN Take 1 tablet (500 mg total) by mouth every 6 (six) hours as needed for muscle spasms.   metoprolol succinate 25 MG 24 hr tablet Commonly known as: TOPROL-XL Take 0.5 tablets (12.5 mg total) by mouth at bedtime.   omeprazole 40 MG capsule Commonly known as: PRILOSEC Take 40 mg by mouth at bedtime.   oxyCODONE 5 MG immediate release tablet Commonly known as: Oxy IR/ROXICODONE Take 1 tablet (5 mg total) by mouth every 3 (three) hours as needed for moderate pain ((score 4 to 6)). What changed:   when to take this  reasons to take this   potassium gluconate 595 (99 K) MG Tabs tablet Take 595 mg by mouth at bedtime as needed (leg cramps).   rOPINIRole 0.25 MG tablet Commonly known as: REQUIP TAKE 1 TABLET BY MOUTH EVERY EVENING What changed: when to take this   senna-docusate 8.6-50 MG tablet Commonly known as: Senokot-S Take 1 tablet by mouth at bedtime as needed for mild constipation.   spironolactone 25 MG tablet Commonly known as: ALDACTONE Take 0.5 tablets by mouth daily.         Signed: Deetta Perla 12/25/2020, 3:01 PM

## 2020-12-25 NOTE — Care Management Important Message (Signed)
Important Message  Patient Details  Name: MIIA BLANKS MRN: [id number] Date of Birth: [birth year]   Medicare Important Message Given:        Juliann Pulse A Alaena Strader 12/25/2020, 2:05 PM

## 2020-12-25 NOTE — Progress Notes (Signed)
    Attending Progress Note  History: Kelly Ritter is here for cauda equina syndrome.  DOS 12/22/20 - L L4-5 microdiscectomy  POD#3: Leg pain is largest complaint. She is controlling bladder but with urgency. No bowel movement. Sensation has been improving. She did work with PT and home recommended.  POD2: Leg pain continues.  Sensation improved.  POD1: Still having some leg pain.  Her bladder control is improved.  Physical Exam: Vitals:   12/25/20 0521 12/25/20 0755  BP: (!) 148/73 (!) 155/73  Pulse: 71 64  Resp: 16 16  Temp: 99 F (37.2 C) 97.8 F (36.6 C)  SpO2: 95% 95%    Alert, awake  Strength:5/5 throughout BLE. R ankle fused, so no exam for DF or PF on R. Sensation intact in BLE  R ankle fused - cannot assess strength   Data:  Recent Labs  Lab 12/23/20 1337 12/24/20 0537 12/25/20 0442  NA 139 140 139  K 6.1* 5.2* 3.9  CL 105 105 105  CO2 24 26 26   BUN 27* 25* 27*  CREATININE 1.00 0.88 0.82  GLUCOSE 153* 151* 106*  CALCIUM 9.1 9.2 8.5*   No results for input(s): AST, ALT, ALKPHOS in the last 168 hours.  Invalid input(s): TBILI   Recent Labs  Lab 12/21/20 1842 12/23/20 1337 12/25/20 0442  WBC 7.1 11.6* 9.2  HGB 12.1 11.0* 11.0*  HCT 37.7 34.4* 32.6*  PLT 239 222 229   Recent Labs  Lab 12/21/20 1842  APTT 31  INR 1.0         Other tests/results: none to review  Assessment/Plan:  Kelly Ritter is doing well after microdiscectomy for cauda equina syndrome.  Her perineal sensation and bladder function is improved.  - mobilize - pain control - DVT prophylaxis started - PTOT - HHPT recommended  - steroids for radicular irritation - Dispo planning, cleared by cardiology   Deetta Perla, MD Department of Neurosurgery

## 2020-12-25 NOTE — TOC Progression Note (Signed)
Transition of Care [facility]) - Progression Note    Patient Details  Name: ROSY ESTABROOK MRN: [id number] Date of Birth: 25-Mar-1934  Transition of Care [facility]) CM/SW [place], RN Phone Number: 12/25/2020, 11:11 AM  Clinical Narrative:   Spoke to the patient's daughter on the phone, she has in the works a plan with the PCP to get a PCA set up, in the meantime she would like a bath aid to come out to the home to help a couple times a week, had Bath aide added to the [facility] orders that she is currently set up with for [facility]    Expected Discharge Plan: [place] Barriers to Discharge: Continued Medical Work up  Expected Discharge Plan and Services Expected Discharge Plan: [place] Choice: [place] arrangements for the past 2 months: [place]: PT [place]: [place] ([place]) Date HH Agency Contacted: 12/23/20 Time [place]: 1526 Representative spoke with at [place]: [place] ([place]) Interventions    Readmission Risk Interventions No flowsheet data found.

## 2020-12-25 NOTE — Progress Notes (Signed)
Physical Therapy Treatment Patient Details Name: Kelly Ritter MRN: [id number] DOB: 05/24/1934 Today's Date: 12/25/2020    History of Present Illness Pt is an 87 yo female diagnosed with cauda equina syndrome and is s/p left L4-5 microdiscectomy and bilateral decompression. PMH includes: MI, CAD s/p stent placement, HTN, anxiety, CVA, R ankle fusion, hiatal hernia, and skin CA.    PT Comments    Pt ready for session.  To EOB with rails but no assist.  Stood to RW with min guard and is able to walk 120' with RW and slow but generally steady gait.  Some imbalances but able to self correct.  Primary barrier is pain she reports "I can walk, it's just the pain."  Encouragement provided.  She does remain in recliner after session with needs met.    Pt stated she would like a commode for home but is unsure if daughter has purchased one yet.  Will add to equipment list and pt to call daughter to check.  Recommend +1 assist for mobility at home.  Voiced understanding.   Follow Up Recommendations  Home health PT;Supervision/Assistance - 24 hour     Equipment Recommendations  3in1 (PT)    Recommendations for Other Services       Precautions / Restrictions Precautions Precautions: Fall Restrictions Weight Bearing Restrictions: No Other Position/Activity Restrictions: No back brace required    Mobility  Bed Mobility Overal bed mobility: Modified Independent     Sidelying to sit: Supervision            Transfers Overall transfer level: Needs assistance Equipment used: Rolling walker (2 wheeled) Transfers: Sit to/from Stand Sit to Stand: Min guard;Min assist            Ambulation/Gait Ambulation/Gait assistance: Min guard Gait Distance (Feet): 150 Feet Assistive device: Rolling walker (2 wheeled) Gait Pattern/deviations: Step-to pattern;Antalgic;Decreased stance time - right;Step-through pattern Gait velocity: decreased   General Gait Details: generally steady with  slow gait.  Occasional imbalances but no outside assistance needed to correct.   Stairs             Wheelchair Mobility    Modified Rankin (Stroke Patients Only)       Balance Overall balance assessment: Needs assistance   Sitting balance-Leahy Scale: Normal     Standing balance support: Bilateral upper extremity supported;During functional activity Standing balance-Leahy Scale: Fair Standing balance comment: Min to mod lean on the RW for support                            Cognition Arousal/Alertness: Awake/alert Behavior During Therapy: WFL for tasks assessed/performed Overall Cognitive Status: Within Functional Limits for tasks assessed                                        Exercises      General Comments        Pertinent Vitals/Pain Pain Assessment: 0-10 Pain Score: 10-Worst pain ever Pain Location: LBP and R LE with mobilization Pain Descriptors / Indicators: Grimacing;Guarding;Sore Pain Intervention(s): Limited activity within patient's tolerance;Monitored during session;Repositioned    Home Living                      Prior Function            PT Goals (current goals can now be found in  the care plan section) Progress towards PT goals: Progressing toward goals    Frequency    7X/week      PT Plan Current plan remains appropriate    Co-evaluation              AM-PAC PT "6 Clicks" Mobility   Outcome Measure  Help needed turning from your back to your side while in a flat bed without using bedrails?: None Help needed moving from lying on your back to sitting on the side of a flat bed without using bedrails?: A Little Help needed moving to and from a bed to a chair (including a wheelchair)?: A Little Help needed standing up from a chair using your arms (e.g., wheelchair or bedside chair)?: A Little Help needed to walk in hospital room?: A Little Help needed climbing 3-5 steps with a railing? :  A Little 6 Click Score: 19    End of Session Equipment Utilized During Treatment: Gait belt Activity Tolerance: Patient tolerated treatment well Patient left: in chair;with call bell/phone within reach;with chair alarm set;with SCD's reapplied Nurse Communication: Mobility status PT Visit Diagnosis: Unsteadiness on feet (R26.81);Other abnormalities of gait and mobility (R26.89);Muscle weakness (generalized) (M62.81);Pain Pain - Right/Left: Right Pain - part of body: Leg     Time: 1000-1012 PT Time Calculation (min) (ACUTE ONLY): 12 min  Charges:  $Gait Training: 8-22 mins                    Chesley Noon, PTA 12/25/20, 10:25 AM

## 2020-12-25 NOTE — Plan of Care (Signed)

## 2020-12-25 NOTE — TOC Progression Note (Signed)
Transition of Care [facility]) - Progression Note    Patient Details  Name: Kelly Ritter MRN: [id number] Date of Birth: 1934/05/23  Transition of Care [facility]) CM/SW [place], RN Phone Number: 12/25/2020, 10:50 AM  Clinical Narrative:   Met with the patient in the room, she will have [facility] for [facility] and she will need a 3 in 1, it was ordered and will be brought to the room from [place] prior to DC, she stated that she has transportation and can afford her medications   Expected Discharge Plan: [place] Barriers to Discharge: Continued Medical Work up  Expected Discharge Plan and Services Expected Discharge Plan: [place] Choice: [place] arrangements for the past 2 months: [place]: PT [place]: [place] ([place]) Date [place]: 12/23/20 Time [place]: 1526 Representative spoke with at [place]: [place] ([place]) Interventions    Readmission Risk Interventions No flowsheet data found.

## 2021-03-26 ENCOUNTER — Ambulatory Visit: Payer: Medicare Other

## 2022-12-18 ENCOUNTER — Other Ambulatory Visit: Payer: Self-pay | Admitting: Surgery

## 2022-12-18 DIAGNOSIS — M75121 Complete rotator cuff tear or rupture of right shoulder, not specified as traumatic: Secondary | ICD-10-CM

## 2022-12-18 DIAGNOSIS — M7581 Other shoulder lesions, right shoulder: Secondary | ICD-10-CM

## 2023-03-10 ENCOUNTER — Other Ambulatory Visit: Payer: Self-pay | Admitting: Family Medicine

## 2023-03-10 DIAGNOSIS — M5416 Radiculopathy, lumbar region: Secondary | ICD-10-CM

## 2023-03-12 ENCOUNTER — Encounter: Payer: Self-pay | Admitting: Podiatry

## 2023-03-12 ENCOUNTER — Ambulatory Visit: Payer: Medicare HMO | Admitting: Podiatry

## 2023-03-12 DIAGNOSIS — L84 Corns and callosities: Secondary | ICD-10-CM | POA: Diagnosis not present

## 2023-03-12 DIAGNOSIS — G629 Polyneuropathy, unspecified: Secondary | ICD-10-CM

## 2023-03-12 DIAGNOSIS — I739 Peripheral vascular disease, unspecified: Secondary | ICD-10-CM

## 2023-03-12 NOTE — Patient Instructions (Signed)
Look for urea 40% cream or ointment and apply to the thickened dry skin / calluses. This can be bought over the counter, at a pharmacy or online such as [employer]. 'The pad I put in your shoe is called a felt Dancer's pad (right foot), they are on [employer] as well

## 2023-03-12 NOTE — Progress Notes (Signed)
  Subjective:  Patient ID: Kelly Ritter, female    DOB: [birth year],  MRN: [id number]  Chief Complaint  Patient presents with   Diabetes    "I have a callus on the ball of my foot." N - callus L - 5th met right D - 2 weeks O - gradually worse C - sharp pain, I'm Diabetic. A - step on it, hurts even when sitting T - put these patches on it    89 y.o. female presents with the above complaint. History confirmed with patient.  She returns for follow-up the callused area is still very painful  Objective:  Physical Exam: warm, good capillary refill, no trophic changes or ulcerative lesions, weakly palpable DP and PT pulses and normal sensory exam.  Right Foot: Status post toe amputation of fifth toe, minimal to no range of motion of remaining digits, she has a severe hyperkeratosis and corn sub 5 met head  Assessment:   1. Callus of foot   2. Peripheral polyneuropathy   3. PAD (peripheral artery disease) (HCC)      Plan:  Patient was evaluated and treated and all questions answered.   All symptomatic hyperkeratoses were safely debrided with a sterile #15 blade to patient's level of comfort without incident. We discussed preventative and palliative care of these lesions including supportive and accommodative shoegear, padding, prefabricated and custom molded accommodative orthoses, use of a pumice stone and lotions/creams daily.  Continue to utilize the urea cream and offloading dancers pad.  A new dancers pad was put in her shoes today.  She will return to follow-up with me as needed as this returns.  No follow-ups on file.

## 2023-03-24 ENCOUNTER — Encounter: Payer: Self-pay | Admitting: Surgery

## 2023-04-02 ENCOUNTER — Other Ambulatory Visit: Payer: Medicare Other

## 2023-04-02 ENCOUNTER — Inpatient Hospital Stay: Admission: RE | Admit: 2023-04-02 | Payer: Medicare Other | Source: Ambulatory Visit

## 2023-04-23 ENCOUNTER — Other Ambulatory Visit: Payer: Self-pay

## 2023-04-23 ENCOUNTER — Emergency Department: Payer: Medicare HMO

## 2023-04-23 ENCOUNTER — Observation Stay
Admission: EM | Admit: 2023-04-23 | Discharge: 2023-04-24 | Disposition: A | Discharge status: Home Health | Payer: Medicare HMO | Attending: Internal Medicine | Admitting: Internal Medicine

## 2023-04-23 DIAGNOSIS — E538 Deficiency of other specified B group vitamins: Secondary | ICD-10-CM | POA: Diagnosis present

## 2023-04-23 DIAGNOSIS — M6281 Muscle weakness (generalized): Secondary | ICD-10-CM | POA: Insufficient documentation

## 2023-04-23 DIAGNOSIS — Z87891 Personal history of nicotine dependence: Secondary | ICD-10-CM | POA: Diagnosis not present

## 2023-04-23 DIAGNOSIS — R7303 Prediabetes: Secondary | ICD-10-CM | POA: Insufficient documentation

## 2023-04-23 DIAGNOSIS — I200 Unstable angina: Principal | ICD-10-CM

## 2023-04-23 DIAGNOSIS — R2681 Unsteadiness on feet: Secondary | ICD-10-CM | POA: Diagnosis not present

## 2023-04-23 DIAGNOSIS — F411 Generalized anxiety disorder: Secondary | ICD-10-CM | POA: Diagnosis present

## 2023-04-23 DIAGNOSIS — Z955 Presence of coronary angioplasty implant and graft: Secondary | ICD-10-CM | POA: Insufficient documentation

## 2023-04-23 DIAGNOSIS — K219 Gastro-esophageal reflux disease without esophagitis: Secondary | ICD-10-CM | POA: Diagnosis present

## 2023-04-23 DIAGNOSIS — D649 Anemia, unspecified: Secondary | ICD-10-CM | POA: Diagnosis not present

## 2023-04-23 DIAGNOSIS — Z91040 Latex allergy status: Secondary | ICD-10-CM | POA: Diagnosis not present

## 2023-04-23 DIAGNOSIS — I25119 Atherosclerotic heart disease of native coronary artery with unspecified angina pectoris: Secondary | ICD-10-CM | POA: Diagnosis not present

## 2023-04-23 DIAGNOSIS — Z79899 Other long term (current) drug therapy: Secondary | ICD-10-CM | POA: Insufficient documentation

## 2023-04-23 DIAGNOSIS — I10 Essential (primary) hypertension: Secondary | ICD-10-CM | POA: Diagnosis not present

## 2023-04-23 DIAGNOSIS — Z85828 Personal history of other malignant neoplasm of skin: Secondary | ICD-10-CM | POA: Diagnosis not present

## 2023-04-23 DIAGNOSIS — D519 Vitamin B12 deficiency anemia, unspecified: Secondary | ICD-10-CM | POA: Insufficient documentation

## 2023-04-23 DIAGNOSIS — R0789 Other chest pain: Secondary | ICD-10-CM | POA: Diagnosis present

## 2023-04-23 DIAGNOSIS — R079 Chest pain, unspecified: Secondary | ICD-10-CM | POA: Diagnosis present

## 2023-04-23 LAB — CBC
HCT: 30.8 % — ABNORMAL LOW (ref 36.0–46.0)
Hemoglobin: 8.9 g/dL — ABNORMAL LOW (ref 12.0–15.0)
MCH: 23.4 pg — ABNORMAL LOW (ref 26.0–34.0)
MCHC: 28.9 g/dL — ABNORMAL LOW (ref 30.0–36.0)
MCV: 81.1 fL (ref 80.0–100.0)
Platelets: 367 10*3/uL (ref 150–400)
RBC: 3.8 MIL/uL — ABNORMAL LOW (ref 3.87–5.11)
RDW: 15.9 % — ABNORMAL HIGH (ref 11.5–15.5)
WBC: 8.3 10*3/uL (ref 4.0–10.5)
nRBC: 0 % (ref 0.0–0.2)

## 2023-04-23 LAB — LIPID PANEL
Cholesterol: 141 mg/dL (ref 0–200)
HDL: 48 mg/dL (ref >40)
LDL Cholesterol: 65 mg/dL (ref 0–99)
Total CHOL/HDL Ratio: 2.9 ratio
Triglycerides: 142 mg/dL (ref <150)
VLDL: 28 mg/dL (ref 0–40)

## 2023-04-23 LAB — HEPATIC FUNCTION PANEL
ALT: 12 U/L (ref 0–44)
AST: 37 U/L (ref 15–41)
Albumin: 3.8 g/dL (ref 3.5–5.0)
Alkaline Phosphatase: 118 U/L (ref 38–126)
Bilirubin, Direct: 0.4 mg/dL — ABNORMAL HIGH (ref 0.0–0.2)
Indirect Bilirubin: 0.4 mg/dL (ref 0.3–0.9)
Total Bilirubin: 0.8 mg/dL (ref 0.3–1.2)
Total Protein: 7.9 g/dL (ref 6.5–8.1)

## 2023-04-23 LAB — PREPARE RBC (CROSSMATCH)

## 2023-04-23 LAB — TROPONIN I (HIGH SENSITIVITY)
Troponin I (High Sensitivity): 14 ng/L (ref <18)
Troponin I (High Sensitivity): 16 ng/L (ref <18)

## 2023-04-23 LAB — BASIC METABOLIC PANEL
Anion gap: 14 (ref 5–15)
BUN: 15 mg/dL (ref 8–23)
CO2: 22 mmol/L (ref 22–32)
Calcium: 9.3 mg/dL (ref 8.9–10.3)
Chloride: 103 mmol/L (ref 98–111)
Creatinine, Ser: 0.84 mg/dL (ref 0.44–1.00)
GFR, Estimated: >60 mL/min (ref >60)
Glucose, Bld: 134 mg/dL — ABNORMAL HIGH (ref 70–99)
Potassium: 4.7 mmol/L (ref 3.5–5.1)
Sodium: 139 mmol/L (ref 135–145)

## 2023-04-23 LAB — PROTIME-INR
INR: 1.2 (ref 0.8–1.2)
Prothrombin Time: 15.5 s — ABNORMAL HIGH (ref 11.4–15.2)

## 2023-04-23 LAB — ABO/RH: ABO/RH(D): O POS

## 2023-04-23 LAB — TSH: TSH: 5.177 u[IU]/mL — ABNORMAL HIGH (ref 0.350–4.500)

## 2023-04-23 LAB — T4, FREE: Free T4: 1.1 ng/dL (ref 0.61–1.12)

## 2023-04-23 LAB — APTT: aPTT: 31 s (ref 24–36)

## 2023-04-23 MED ORDER — SODIUM CHLORIDE 0.9 % IV SOLN: 250.0000 mL | INTRAVENOUS | Status: DC | PRN

## 2023-04-23 MED ORDER — ATORVASTATIN CALCIUM 20 MG PO TABS
40.0000 mg | ORAL_TABLET | Freq: Every day | ORAL | Status: DC
  Administered 2023-04-24: 40 mg via ORAL
  Filled 2023-04-23: qty 2

## 2023-04-23 MED ORDER — HEPARIN BOLUS VIA INFUSION
4000.0000 [IU] | Freq: Once | INTRAVENOUS | Status: AC
  Administered 2023-04-23: 4000 [IU] via INTRAVENOUS
  Filled 2023-04-23: qty 4000

## 2023-04-23 MED ORDER — ASPIRIN 81 MG PO CHEW
324.0000 mg | CHEWABLE_TABLET | Freq: Once | ORAL | Status: AC
  Administered 2023-04-23: 324 mg via ORAL
  Filled 2023-04-23: qty 4

## 2023-04-23 MED ORDER — SODIUM CHLORIDE 0.9% FLUSH
3.0000 mL | Freq: Two times a day (BID) | INTRAVENOUS | Status: DC
  Administered 2023-04-23: 3 mL via INTRAVENOUS

## 2023-04-23 MED ORDER — GABAPENTIN 100 MG PO CAPS
100.0000 mg | ORAL_CAPSULE | Freq: Three times a day (TID) | ORAL | Status: DC
  Administered 2023-04-23 – 2023-04-24 (×2): 100 mg via ORAL
  Filled 2023-04-23 (×2): qty 1

## 2023-04-23 MED ORDER — METOPROLOL SUCCINATE ER 25 MG PO TB24
12.5000 mg | ORAL_TABLET | Freq: Every day | ORAL | Status: DC
  Administered 2023-04-23: 12.5 mg via ORAL
  Filled 2023-04-23: qty 1

## 2023-04-23 MED ORDER — ROPINIROLE HCL 0.25 MG PO TABS
0.5000 mg | ORAL_TABLET | Freq: Every day | ORAL | Status: DC
  Administered 2023-04-23 – 2023-04-24 (×2): 0.5 mg via ORAL
  Filled 2023-04-23 (×2): qty 2

## 2023-04-23 MED ORDER — TRAZODONE HCL 50 MG PO TABS
50.0000 mg | ORAL_TABLET | Freq: Every day | ORAL | Status: DC
  Administered 2023-04-23: 50 mg via ORAL
  Filled 2023-04-23: qty 1

## 2023-04-23 MED ORDER — METOPROLOL TARTRATE 5 MG/5ML IV SOLN
2.5000 mg | Freq: Once | INTRAVENOUS | Status: AC
  Administered 2023-04-23: 2.5 mg via INTRAVENOUS
  Filled 2023-04-23: qty 5

## 2023-04-23 MED ORDER — NITROGLYCERIN 0.4 MG SL SUBL
0.4000 mg | SUBLINGUAL_TABLET | SUBLINGUAL | Status: DC | PRN
  Administered 2023-04-23 (×2): 0.4 mg via SUBLINGUAL
  Filled 2023-04-23: qty 1

## 2023-04-23 MED ORDER — LEVOTHYROXINE SODIUM 25 MCG PO TABS
25.0000 ug | ORAL_TABLET | Freq: Every day | ORAL | Status: DC
  Administered 2023-04-24: 25 ug via ORAL
  Filled 2023-04-23: qty 1

## 2023-04-23 MED ORDER — SODIUM CHLORIDE 0.9% FLUSH: 3.0000 mL | INTRAVENOUS | Status: DC | PRN

## 2023-04-23 MED ORDER — DULOXETINE HCL 60 MG PO CPEP
60.0000 mg | ORAL_CAPSULE | Freq: Every day | ORAL | Status: DC
  Administered 2023-04-24: 60 mg via ORAL
  Filled 2023-04-23: qty 1

## 2023-04-23 MED ORDER — HEPARIN (PORCINE) 25000 UT/250ML-% IV SOLN
850.0000 [IU]/h | INTRAVENOUS | Status: DC
  Administered 2023-04-23: 850 [IU]/h via INTRAVENOUS
  Filled 2023-04-23: qty 250

## 2023-04-23 MED ORDER — ONDANSETRON HCL 4 MG/2ML IJ SOLN: 4.0000 mg | Freq: Four times a day (QID) | INTRAMUSCULAR | Status: DC | PRN

## 2023-04-23 MED ORDER — DONEPEZIL HCL 5 MG PO TABS
10.0000 mg | ORAL_TABLET | Freq: Every day | ORAL | Status: DC
  Administered 2023-04-24: 10 mg via ORAL
  Filled 2023-04-23: qty 2

## 2023-04-23 MED ORDER — SODIUM CHLORIDE 0.9 % IV SOLN: 10.0000 mL/h | Freq: Once | INTRAVENOUS | Status: DC

## 2023-04-23 MED ORDER — MORPHINE SULFATE (PF) 2 MG/ML IV SOLN
2.0000 mg | INTRAVENOUS | Status: DC | PRN
  Administered 2023-04-23: 2 mg via INTRAVENOUS
  Filled 2023-04-23: qty 1

## 2023-04-23 MED ORDER — ASPIRIN 81 MG PO TBEC
81.0000 mg | DELAYED_RELEASE_TABLET | Freq: Every day | ORAL | Status: DC
  Administered 2023-04-24: 81 mg via ORAL
  Filled 2023-04-23: qty 1

## 2023-04-23 MED ORDER — ACETAMINOPHEN 325 MG PO TABS
650.0000 mg | ORAL_TABLET | ORAL | Status: DC | PRN
  Administered 2023-04-23: 650 mg via ORAL
  Filled 2023-04-23: qty 2

## 2023-04-23 MED ORDER — NITROGLYCERIN 2 % TD OINT
0.5000 [in_us] | TOPICAL_OINTMENT | Freq: Once | TRANSDERMAL | Status: AC
  Administered 2023-04-23: 0.5 [in_us] via TOPICAL
  Filled 2023-04-23: qty 1

## 2023-04-23 NOTE — ED Provider Notes (Signed)
[facility] Provider Note    Event Date/Time   First MD Initiated Contact with Patient 04/23/23 1712     (approximate)   History   Shortness of Breath and Chest Pain   HPI  Kelly Ritter is a 89 y.o. female with an extensive cardiac history presents to the ER for evaluation of chest pain pressure and tightness associated with cold sweats particular with exertion over the past 36 hours.  States that she is been compliant with her medications.  Not on any blood thinners.  No lower extremity swelling.  Does have some mild tightness and discomfort right now but not nearly as severe as it was yesterday.  She follows with [facility] cardiology.     Physical Exam   Triage Vital Signs: ED Triage Vitals  Encounter Vitals Group     BP 04/23/23 1707 (!) 191/97     Systolic BP Percentile --      Diastolic BP Percentile --      Pulse Rate 04/23/23 1707 (!) 123     Resp 04/23/23 1707 (!) 26     Temp 04/23/23 1707 98 F (36.7 C)     Temp src --      SpO2 04/23/23 1707 99 %     Weight 04/23/23 1708 170 lb (77.1 kg)     Height 04/23/23 1708 5' 4.5" (1.638 m)     Head Circumference --      Peak Flow --      Pain Score 04/23/23 1708 0     Pain Loc --      Pain Education --      Exclude from Growth Chart --     Most recent vital signs: Vitals:   04/23/23 1755 04/23/23 1804  BP: (!) 196/83 (!) 149/91  Pulse: 90 (!) 101  Resp: (!) 22 (!) 21  Temp:    SpO2: 100% 98%     Constitutional: Alert  Eyes: Conjunctivae are normal.  Head: Atraumatic. Nose: No congestion/rhinnorhea. Mouth/Throat: Mucous membranes are moist.   Neck: Painless ROM.  Cardiovascular:   Good peripheral circulation. Respiratory: Normal respiratory effort.  No retractions.  Gastrointestinal: Soft and nontender.  Musculoskeletal:  no deformity Neurologic:  MAE spontaneously. No gross focal neurologic deficits are appreciated.  Skin:  Skin is warm, dry and intact. No rash  noted. Psychiatric: Mood and affect are normal. Speech and behavior are normal.    ED Results / Procedures / Treatments   Labs (all labs ordered are listed, but only abnormal results are displayed) Labs Reviewed  BASIC METABOLIC PANEL - Abnormal; Notable for the following components:      Result Value   Glucose, Bld 134 (*)    All other components within normal limits  CBC - Abnormal; Notable for the following components:   RBC 3.80 (*)    Hemoglobin 8.9 (*)    HCT 30.8 (*)    MCH 23.4 (*)    MCHC 28.9 (*)    RDW 15.9 (*)    All other components within normal limits  PREPARE RBC (CROSSMATCH)  ABO/RH  TROPONIN I (HIGH SENSITIVITY)  TROPONIN I (HIGH SENSITIVITY)     EKG  ED ECG REPORT I, Willy Eddy, the attending physician, personally viewed and interpreted this ECG.   Date: 04/23/2023  EKG Time: 17:09  Rate: 115  Rhythm: sinus  Axis: normal  Intervals: normal  ST&T Change: inferolateral st abn, no stemi    RADIOLOGY Please see ED Course  for my review and interpretation.  I personally reviewed all radiographic images ordered to evaluate for the above acute complaints and reviewed radiology reports and findings.  These findings were personally discussed with the patient.  Please see medical record for radiology report.    PROCEDURES:  Critical Care performed: No  Procedures   MEDICATIONS ORDERED IN ED: Medications  nitroGLYCERIN (NITROSTAT) SL tablet 0.4 mg (0.4 mg Sublingual Given 04/23/23 1801)  nitroGLYCERIN (NITROGLYN) 2 % ointment 0.5 inch (has no administration in time range)  0.9 %  sodium chloride infusion (has no administration in time range)  aspirin chewable tablet 324 mg (324 mg Oral Given 04/23/23 1731)  metoprolol tartrate (LOPRESSOR) injection 2.5 mg (2.5 mg Intravenous Given 04/23/23 1750)     IMPRESSION / MDM / ASSESSMENT AND PLAN / ED COURSE  I reviewed the triage vital signs and the nursing notes.                               Differential diagnosis includes, but is not limited to, ACS, pericarditis, esophagitis, boerhaaves, pe, dissection, pna, bronchitis, costochondritis  Patient presenting to the ER for evaluation of symptoms as described above.  Based on symptoms, risk factors and considered above differential, this presenting complaint could reflect a potentially life-threatening illness therefore the patient will be placed on continuous pulse oximetry and telemetry for monitoring.  Laboratory evaluation will be sent to evaluate for the above complaints.  Initial troponin negative.  Patient is hypertensive.  She has had a few more episodes of the discomfort therefore concern for unstable angina given her history.  Will heparinize will continue to treat blood pressure have consulted hospitalist for admission.          FINAL CLINICAL IMPRESSION(S) / ED DIAGNOSES   Final diagnoses:  Unstable angina (HCC)     Rx / DC Orders   ED Discharge Orders     None        Note:  This document was prepared using Dragon voice recognition software and may include unintentional dictation errors.    Willy Eddy, MD 04/23/23 [phone]

## 2023-04-23 NOTE — ED Notes (Signed)
C/O left sided chest pain 5/10.  VS checked.  Metoprolol  given per MAE.  Skin warm and dry. No SOB/ DOE.  12-lead obtained.

## 2023-04-23 NOTE — ED Triage Notes (Signed)
Pt to ED from PCP for shob and chest heaviness for the past couple days. DOE noted. Reports EKG changes at doctor today. Labored breathing.
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
## 2023-04-23 NOTE — Hospital Course (Signed)
Admission request for chest pain.  PMH of CAD/ Stents / coming for chest pain.
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
## 2023-04-24 ENCOUNTER — Other Ambulatory Visit: Payer: Medicare HMO

## 2023-04-24 DIAGNOSIS — R0789 Other chest pain: Secondary | ICD-10-CM

## 2023-04-24 DIAGNOSIS — F411 Generalized anxiety disorder: Secondary | ICD-10-CM

## 2023-04-24 DIAGNOSIS — E538 Deficiency of other specified B group vitamins: Secondary | ICD-10-CM

## 2023-04-24 DIAGNOSIS — I10 Essential (primary) hypertension: Secondary | ICD-10-CM

## 2023-04-24 DIAGNOSIS — I2510 Atherosclerotic heart disease of native coronary artery without angina pectoris: Secondary | ICD-10-CM

## 2023-04-24 DIAGNOSIS — R079 Chest pain, unspecified: Secondary | ICD-10-CM | POA: Diagnosis present

## 2023-04-24 DIAGNOSIS — R7303 Prediabetes: Secondary | ICD-10-CM

## 2023-04-24 LAB — BASIC METABOLIC PANEL
Anion gap: 10 (ref 5–15)
BUN: 13 mg/dL (ref 8–23)
CO2: 24 mmol/L (ref 22–32)
Calcium: 8.7 mg/dL — ABNORMAL LOW (ref 8.9–10.3)
Chloride: 103 mmol/L (ref 98–111)
Creatinine, Ser: 0.78 mg/dL (ref 0.44–1.00)
GFR, Estimated: >60 mL/min (ref >60)
Glucose, Bld: 105 mg/dL — ABNORMAL HIGH (ref 70–99)
Potassium: 3.8 mmol/L (ref 3.5–5.1)
Sodium: 137 mmol/L (ref 135–145)

## 2023-04-24 LAB — CBC
HCT: 25.7 % — ABNORMAL LOW (ref 36.0–46.0)
Hemoglobin: 7.6 g/dL — ABNORMAL LOW (ref 12.0–15.0)
MCH: 23.9 pg — ABNORMAL LOW (ref 26.0–34.0)
MCHC: 29.6 g/dL — ABNORMAL LOW (ref 30.0–36.0)
MCV: 80.8 fL (ref 80.0–100.0)
Platelets: 312 10*3/uL (ref 150–400)
RBC: 3.18 MIL/uL — ABNORMAL LOW (ref 3.87–5.11)
RDW: 15.6 % — ABNORMAL HIGH (ref 11.5–15.5)
WBC: 7.3 10*3/uL (ref 4.0–10.5)
nRBC: 0 % (ref 0.0–0.2)

## 2023-04-24 LAB — HEPARIN LEVEL (UNFRACTIONATED): Heparin Unfractionated: 0.56 [IU]/mL (ref 0.30–0.70)

## 2023-04-24 LAB — TROPONIN I (HIGH SENSITIVITY)
Troponin I (High Sensitivity): 15 ng/L (ref <18)
Troponin I (High Sensitivity): 15 ng/L (ref <18)

## 2023-04-24 MED ORDER — HYDRALAZINE HCL 20 MG/ML IJ SOLN: 10.0000 mg | Freq: Four times a day (QID) | INTRAMUSCULAR | Status: DC | PRN

## 2023-04-24 MED ORDER — ISOSORBIDE MONONITRATE ER 60 MG PO TB24
30.0000 mg | ORAL_TABLET | Freq: Every day | ORAL | Status: DC
  Administered 2023-04-24: 30 mg via ORAL
  Filled 2023-04-24: qty 1

## 2023-04-24 MED ORDER — ISOSORBIDE MONONITRATE ER 30 MG PO TB24: 30.0000 mg | ORAL_TABLET | Freq: Every day | ORAL | 2 refills | Status: AC

## 2023-04-24 NOTE — Discharge Instructions (Addendum)
Please follow-up with Dr. Marcina Millard (cardiology) at the [facility] in 1 week.  [address] [place], [place] [address] [phone]

## 2023-04-24 NOTE — Assessment & Plan Note (Signed)
MCV WNL.  Pt to f/u with pcp for levels and replacement plan.
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
## 2023-04-24 NOTE — ED Notes (Signed)
 Assumed care of pt at this time. Pt is AAXO4, on CCM, VS stable and WNL. No needs identified at this time.

## 2023-04-24 NOTE — Assessment & Plan Note (Signed)
Vitals:   04/23/23 1707 04/23/23 1734 04/23/23 1746 04/23/23 1755  BP: (!) 191/97 (!) 188/103 (!) 204/85 (!) 196/83   04/23/23 1804 04/23/23 2100 04/23/23 2130 04/23/23 2200  BP: (!) 149/91 (!) 188/81 (!) 176/82 (!) 189/97   04/23/23 2230 04/23/23 2354  BP: (!) 166/86 (!) 160/85  Pt continued on metoprolol. We will start Prn hydralazine.

## 2023-04-24 NOTE — Assessment & Plan Note (Signed)
We will get an A1c. And till then carb consistent diet / heart healthy diet . NPO after midnight.
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
## 2023-04-24 NOTE — Care Management CC44 (Signed)
Condition Code 44 Documentation Completed  Patient Details  Name: Kelly Ritter MRN: [id number] Date of Birth: November 26, 1933   Condition Code 44 given:  Yes Patient signature on Condition Code 44 notice:  Yes Documentation of 2 MD's agreement:  Yes Code 44 added to claim:  Yes    Margarito Liner, LCSW 04/24/2023, 3:01 PM

## 2023-04-24 NOTE — ED Notes (Signed)
Escorted pt to restroom and back to bed safely.

## 2023-04-24 NOTE — Discharge Summary (Signed)
Physician Discharge Summary   Patient: Kelly Ritter MRN: [id number] DOB: 1934/06/28  Admit date:     04/23/2023  Discharge date: 04/24/23  Discharge Physician: Marcelino Duster   PCP: Marcell Anger, NP   Recommendations at discharge:    PCP follow up in 1 week. Cardiology follow up as suggested.  Discharge Diagnoses: Principal Problem:   Unstable angina (HCC) Active Problems:   Anxiety, generalized   Benign essential hypertension   Prediabetes   B12 deficiency   GERD (gastroesophageal reflux disease)   Anemia   Chest pain  Resolved Problems:   * No resolved hospital problems. [facility] Course: As per HPI- Kelly Ritter is a 89 y.o. female  with a past medical history of coronary artery disease s/p DES to mid LAD, prox to mid LCx, and mid RCA, peripheral vascular disease, CVA, hypertension, hyperlipidemia who presented to the ED on 04/23/2023 for chest pain and SOB. Cardiology is consulted for further evaluation.    Patient states that over the last few months she has noticed an increase in dizziness, shortness of breath and balance issues.  She states that for the last 3 weeks she has noticed heaviness in her chest.  This nearly always occurs when she is at rest and generally last 10 minutes and resolves on its own.  She denies any radiation to her arms or her neck.  States that the pain never occurs when she is active.  She reports that she went to her primary care yesterday and discussed her symptoms and was told to come to the ER for evaluation.  In the ED she was given sublingual nitroglycerin which resolved her symptoms.  Troponins have trended 14 > 16 > 15 > 15.  EKG with nonspecific ST-T wave abnormalities which appears stable from prior EKGs.   During the hospital stay, patient had chest pain or shortness of breath or palpitations.  She is seen by cardiology service who recommended Imdur therapy and outpatient cardiac workup. She is seen by PT advised HHPT.  Advised her to follow PCP and cardiology service upon discharge. She understands and agrees.      Consultants: Cardiology Procedures performed: none  Disposition: Home health Diet recommendation:  Discharge Diet Orders (From admission, onward)     Start     Ordered   04/24/23 0000  Diet - low sodium heart healthy        04/24/23 1450           Cardiac and Carb modified diet DISCHARGE MEDICATION: Allergies as of 04/24/2023       Reactions   Sulfa Antibiotics Hives, Itching, Swelling, Rash   Baclofen Other (See Comments)   Muscle spasms and cramps in shoulder area   Latex Rash, Hives, Other (See Comments)   Skin eruptions         Medication List     TAKE these medications    acetaminophen 650 MG CR tablet Commonly known as: TYLENOL Take 1,300 mg by mouth at bedtime as needed for pain.   aspirin EC 81 MG tablet Take 1 tablet (81 mg total) by mouth daily. Swallow whole.   atorvastatin 20 MG tablet Commonly known as: LIPITOR Take 2 tablets (40 mg total) by mouth daily.   Cholecalciferol 25 MCG (1000 UT) tablet Take 1,000 Units by mouth daily.   donepezil 10 MG tablet Commonly known as: ARICEPT Take 10 mg by mouth daily.   DULoxetine 60 MG capsule Commonly known as: CYMBALTA Take 60  mg by mouth daily.   gabapentin 100 MG capsule Commonly known as: NEURONTIN Take 100 mg by mouth 3 (three) times daily.   isosorbide mononitrate 30 MG 24 hr tablet Commonly known as: IMDUR Take 1 tablet (30 mg total) by mouth daily. Start taking on: April 25, 2023   levothyroxine 25 MCG tablet Commonly known as: SYNTHROID Take 25 mcg by mouth daily.   metoprolol succinate 25 MG 24 hr tablet Commonly known as: TOPROL-XL Take 0.5 tablets (12.5 mg total) by mouth at bedtime. What changed: how much to take   omeprazole 20 MG capsule Commonly known as: PRILOSEC Take 20 mg by mouth 2 (two) times daily.   potassium gluconate 595 (99 K) MG Tabs tablet Take 595 mg by  mouth at bedtime as needed (leg cramps).   rOPINIRole 0.5 MG tablet Commonly known as: REQUIP Take 0.5 mg by mouth daily.   traZODone 50 MG tablet Commonly known as: DESYREL Take 50 mg by mouth at bedtime.        Discharge Exam: Filed Weights   04/23/23 1708  Weight: 77.1 kg   General - Elderly Caucasian female, no apparent distress HEENT - PERRLA, EOMI, atraumatic head, non tender sinuses. Lung - Clear, diffuse rhonchi. Heart - S1, S2 heard, no murmurs, rubs, trace pedal edema Neuro - Alert, awake and oriented x 3, non focal exam. Skin - Warm and dry.  Condition at discharge: stable  The results of significant diagnostics from this hospitalization (including imaging, microbiology, ancillary and laboratory) are listed below for reference.   Imaging Studies: DG Chest Port 1 View  Result Date: 04/23/2023 CLINICAL DATA:  Shortness of breath and chest heaviness EXAM: PORTABLE CHEST 1 VIEW COMPARISON:  Chest radiograph 09/24/2015 FINDINGS: The cardiomediastinal silhouette is normal There is no focal consolidation or pulmonary edema. There is no pleural effusion or pneumothorax There is no acute osseous abnormality. IMPRESSION: No radiographic evidence of acute cardiopulmonary process. Electronically Signed   By: Lesia Hausen M.D.   On: 04/23/2023 19:20    Microbiology: Results for orders placed or performed during the hospital encounter of 12/21/20  SARS CORONAVIRUS 2 (TAT 6-24 HRS) Nasopharyngeal Nasopharyngeal Swab     Status: None   Collection Time: 12/21/20  6:47 PM   Specimen: Nasopharyngeal Swab  Result Value Ref Range Status   SARS Coronavirus 2 NEGATIVE NEGATIVE Final    Comment: (NOTE) SARS-CoV-2 target nucleic acids are NOT DETECTED.  The SARS-CoV-2 RNA is generally detectable in upper and lower respiratory specimens during the acute phase of infection. Negative results do not preclude SARS-CoV-2 infection, do not rule out co-infections with other pathogens, and  should not be used as the sole basis for treatment or other patient management decisions. Negative results must be combined with clinical observations, patient history, and epidemiological information. The expected result is Negative.  Fact Sheet for Patients: HairSlick.no  Fact Sheet for Healthcare Providers: quierodirigir.com  This test is not yet approved or cleared by the [place] FDA and  has been authorized for detection and/or diagnosis of SARS-CoV-2 by FDA under an Emergency Use Authorization (EUA). This EUA will remain  in effect (meaning this test can be used) for the duration of the COVID-19 declaration under Se ction 564(b)(1) of the Act, 21 U.S.C. section 360bbb-3(b)(1), unless the authorization is terminated or revoked sooner.  Performed at [facility], [address] [place], [place] [address]   MRSA PCR Screening     Status: None   Collection Time:  12/21/20  6:47 PM   Specimen: Nasal Mucosa; Nasopharyngeal  Result Value Ref Range Status   MRSA by PCR NEGATIVE NEGATIVE Final    Comment:        The GeneXpert MRSA Assay (FDA approved for NASAL specimens only), is one component of a comprehensive MRSA colonization surveillance program. It is not intended to diagnose MRSA infection nor to guide or monitor treatment for MRSA infections. Performed at [facility], [address] [place], [place] [address]     Labs: CBC: Recent Labs  Lab 04/23/23 1727 04/24/23 0507  WBC 8.3 7.3  HGB 8.9* 7.6*  HCT 30.8* 25.7*  MCV 81.1 80.8  PLT 367 312   Basic Metabolic Panel: Recent Labs  Lab 04/23/23 1727 04/24/23 0507  NA 139 137  K 4.7 3.8  CL 103 103  CO2 22 24  GLUCOSE 134* 105*  BUN 15 13  CREATININE 0.84 0.78  CALCIUM 9.3 8.7*   Liver Function Tests: Recent Labs  Lab 04/23/23 1727  AST 37  ALT 12  ALKPHOS 118  BILITOT 0.8  PROT 7.9  ALBUMIN 3.8   CBG: No results  for input(s): "GLUCAP" in the last 168 hours.  Discharge time spent: 33 minutes.  Signed: Marcelino Duster, MD [facility] 04/24/2023

## 2023-04-24 NOTE — ED Notes (Signed)
Informed RN bed assigned 

## 2023-04-24 NOTE — Assessment & Plan Note (Signed)
Cont ppi therapy.  ? ?

## 2023-04-24 NOTE — Care Management Obs Status (Signed)
MEDICARE OBSERVATION STATUS NOTIFICATION   Patient Details  Name: Kelly Ritter MRN: [id number] Date of Birth: Aug 12, 1934   Medicare Observation Status Notification Given:  Yes    Margarito Liner, LCSW 04/24/2023, 3:01 PM

## 2023-04-24 NOTE — Assessment & Plan Note (Addendum)
Pt has h/o cad presenting with chest pain. Precordial and radiating to right side pressure like somebody is sitting her chest. Pt does not have any NTG and has not taken anything as it has been intermittent.  Pt is S/P PCI and stent in circumflex and LAD and RCA and restenosis od RCA in 2014 with DES. We will admit to cardiac tele and request cardiology consult.  Heart score of 7. We will continue on - Asa 81 / lipitor 40 / toprol xl / and NTG PRN.  Cardiology message sent to Dr. Juliann Pares, pt is followed at [facility].

## 2023-04-24 NOTE — Assessment & Plan Note (Addendum)
Current cbc with hb of 8./9 / elevated RDW and normal platelet count.  Based on our records pt has anemia intermittently since 2019 November.  We will repeat h/h as pt is on heparin gtt. Type/screen.  No reports of bleeding or melena. Pt does report intermittent diarrhea and constipation.  We will get guaiac. / IV PPI.

## 2023-04-24 NOTE — Evaluation (Signed)
Physical Therapy Evaluation Patient Details Name: Kelly Ritter MRN: [id number] DOB: [birth year] Today's Date: 04/24/2023  History of Present Illness  89 y.o. female  with a past medical history of coronary artery disease s/p DES to mid LAD, prox to mid LCx, and mid RCA, peripheral vascular disease, CVA, hypertension, hyperlipidemia who presented to the ED on 04/23/2023 for chest pain and SOB. Cardiology consulted.  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  She had resting HR 80-90s, up to 100-110 with ambulation and then over 100 ft having increasing fatigue with HR nearly getting to 120.  Pt displayed ability to rise from recliner and ambulation w/o assist but showed some safety awareness issues (consistently using walker, controlling descent to sitting with UEs) but overall did well.  Pt will benefit from continued PT to address functional limitations and work back to PLOF.       If plan is discharge home, recommend the following: Assist for transportation   Can travel by private vehicle        Equipment Recommendations None recommended by PT  Recommendations for Other Services       Functional Status Assessment Patient has had a recent decline in their functional status and demonstrates the ability to make significant improvements in function in a reasonable and predictable amount of time.     Precautions / Restrictions Restrictions Weight Bearing Restrictions: No      Mobility  Bed Mobility               General bed mobility comments: in recliner pre/post session    Transfers Overall transfer level: Needs assistance Equipment used: Rolling walker (2 wheels) Transfers: Sit to/from Stand Sit to Stand: Supervision           General transfer comment: Multiple sit to stands from recliner with UE use on rails.  Pt consistently  struggled to control descent and did "flop" each return to sitting.  No LOBs, occasionally not needing UE on AD/HHA on rising    Ambulation/Gait Ambulation/Gait assistance: Supervision Gait Distance (Feet): 150 Feet Assistive device: Rolling walker (2 wheels)         General Gait Details: 140ft, then 50 ft back to room - chair follow.  Pt with slow, deliberate but safe ambulation.  First 100 ft of ambulation stayed between 100-110 (O2 on room air stayed in the mid 90s) as she got further she started having subjective fatigue and HR >110, nearly to 120 and needed to sit.  Stairs            Wheelchair Mobility     Tilt Bed    Modified Rankin (Stroke Patients Only)       Balance Overall balance assessment: Needs assistance   Sitting balance-Leahy Scale: Normal       Standing balance-Leahy Scale: Fair Standing balance comment: Definite need of UEs with heel raises, tandem standing, SLS (especially on R), struggled with all but static standing w/o UEs                             Pertinent Vitals/Pain Pain Assessment Pain Assessment: No/denies pain    Home Living Family/patient expects to be discharged to:: Private residence Living Arrangements: Alone Available Help at Discharge: Friend(s);Family;Available PRN/intermittently   Home Access: Level entry  Home Layout: One level Home Equipment: Agricultural consultant (2 wheels);Rollator (4 wheels);Cane - single point      Prior Function Prior Level of Function : Independent/Modified Independent;Driving             Mobility Comments: Will often get around w/o AD, does lean on buggy while shopping, has use 4WW recently due to general weakness ADLs Comments: performs all ADLs, does not typically need assistance     Extremity/Trunk Assessment   Upper Extremity Assessment Upper Extremity Assessment: Generalized weakness;Overall [facility] for tasks assessed    Lower Extremity Assessment Lower Extremity  Assessment: Generalized weakness;Overall WFL for tasks assessed (h/o R LE weakness, grossly 4-/5)       Communication   Communication Communication: No apparent difficulties  Cognition Arousal: Alert Behavior During Therapy: Impulsive Overall Cognitive Status: Within Functional Limits for tasks assessed                                 General Comments: needing repeated cues and displays some impulsiveness        General Comments General comments (skin integrity, edema, etc.): Pt pleasant and motivated, displayed some safety awareness issues but overall moved well    Exercises     Assessment/Plan    PT Assessment Patient needs continued PT services  PT Problem List Decreased strength;Decreased activity tolerance;Decreased balance;Decreased mobility;Decreased safety awareness;Decreased knowledge of use of DME;Cardiopulmonary status limiting activity       PT Treatment Interventions DME instruction;Gait training;Functional mobility training;Therapeutic activities;Therapeutic exercise;Balance training;Patient/family education;Neuromuscular re-education    PT Goals (Current goals can be found in the Care Plan section)  Acute Rehab PT Goals Patient Stated Goal: go home today PT Goal Formulation: With patient Time For Goal Achievement: 05/07/23 Potential to Achieve Goals: Good    Frequency Min 2X/week     Co-evaluation               AM-PAC PT "6 Clicks" Mobility  Outcome Measure Help needed turning from your back to your side while in a flat bed without using bedrails?: None Help needed moving from lying on your back to sitting on the side of a flat bed without using bedrails?: None Help needed moving to and from a bed to a chair (including a wheelchair)?: None Help needed standing up from a chair using your arms (e.g., wheelchair or bedside chair)?: None Help needed to walk in hospital room?: A Little Help needed climbing 3-5 steps with a railing? : A  Little 6 Click Score: 22    End of Session Equipment Utilized During Treatment: Gait belt Activity Tolerance: Patient tolerated treatment well;Patient limited by fatigue Patient left: in chair;with call bell/phone within reach Nurse Communication: Mobility status (vitals with activity) PT Visit Diagnosis: Unsteadiness on feet (R26.81);Muscle weakness (generalized) (M62.81)    Time: 1400-1420 PT Time Calculation (min) (ACUTE ONLY): 20 min   Charges:   PT Evaluation $PT Eval Low Complexity: 1 Low PT Treatments $Gait Training: 8-22 mins PT General Charges $$ ACUTE PT VISIT: 1 Visit         Malachi Pro, DPT 04/24/2023, 3:46 PM

## 2023-04-24 NOTE — Assessment & Plan Note (Signed)
Stable, cont cymbalta and trazodone.

## 2023-04-25 LAB — HEMOGLOBIN A1C
Hgb A1c MFr Bld: 6.2 % — ABNORMAL HIGH (ref 4.8–5.6)
Mean Plasma Glucose: 131 mg/dL

## 2023-05-29 ENCOUNTER — Emergency Department: Payer: Medicare HMO

## 2023-05-29 ENCOUNTER — Inpatient Hospital Stay
Admission: EM | Admit: 2023-05-29 | Discharge: 2023-06-06 | DRG: 682 | Disposition: A | Discharge status: Skilled Nursing Facility | Payer: Medicare HMO | Attending: Obstetrics and Gynecology | Admitting: Obstetrics and Gynecology

## 2023-05-29 ENCOUNTER — Inpatient Hospital Stay: Payer: Medicare HMO

## 2023-05-29 ENCOUNTER — Encounter: Payer: Self-pay | Admitting: Internal Medicine

## 2023-05-29 ENCOUNTER — Other Ambulatory Visit: Payer: Self-pay

## 2023-05-29 DIAGNOSIS — T447X5A Adverse effect of beta-adrenoreceptor antagonists, initial encounter: Secondary | ICD-10-CM | POA: Diagnosis present

## 2023-05-29 DIAGNOSIS — K921 Melena: Secondary | ICD-10-CM | POA: Diagnosis not present

## 2023-05-29 DIAGNOSIS — K259 Gastric ulcer, unspecified as acute or chronic, without hemorrhage or perforation: Secondary | ICD-10-CM | POA: Insufficient documentation

## 2023-05-29 DIAGNOSIS — M48061 Spinal stenosis, lumbar region without neurogenic claudication: Secondary | ICD-10-CM | POA: Diagnosis present

## 2023-05-29 DIAGNOSIS — I2489 Other forms of acute ischemic heart disease: Secondary | ICD-10-CM | POA: Diagnosis present

## 2023-05-29 DIAGNOSIS — I739 Peripheral vascular disease, unspecified: Secondary | ICD-10-CM | POA: Diagnosis present

## 2023-05-29 DIAGNOSIS — E7800 Pure hypercholesterolemia, unspecified: Secondary | ICD-10-CM | POA: Diagnosis present

## 2023-05-29 DIAGNOSIS — Z888 Allergy status to other drugs, medicaments and biological substances status: Secondary | ICD-10-CM

## 2023-05-29 DIAGNOSIS — Y92019 Unspecified place in single-family (private) house as the place of occurrence of the external cause: Secondary | ICD-10-CM | POA: Diagnosis not present

## 2023-05-29 DIAGNOSIS — F03918 Unspecified dementia, unspecified severity, with other behavioral disturbance: Secondary | ICD-10-CM | POA: Diagnosis present

## 2023-05-29 DIAGNOSIS — K219 Gastro-esophageal reflux disease without esophagitis: Secondary | ICD-10-CM | POA: Diagnosis present

## 2023-05-29 DIAGNOSIS — Z87891 Personal history of nicotine dependence: Secondary | ICD-10-CM

## 2023-05-29 DIAGNOSIS — I10 Essential (primary) hypertension: Secondary | ICD-10-CM | POA: Diagnosis present

## 2023-05-29 DIAGNOSIS — E039 Hypothyroidism, unspecified: Secondary | ICD-10-CM | POA: Diagnosis present

## 2023-05-29 DIAGNOSIS — Z955 Presence of coronary angioplasty implant and graft: Secondary | ICD-10-CM

## 2023-05-29 DIAGNOSIS — F0394 Unspecified dementia, unspecified severity, with anxiety: Secondary | ICD-10-CM | POA: Diagnosis present

## 2023-05-29 DIAGNOSIS — R7401 Elevation of levels of liver transaminase levels: Secondary | ICD-10-CM | POA: Diagnosis present

## 2023-05-29 DIAGNOSIS — I2510 Atherosclerotic heart disease of native coronary artery without angina pectoris: Secondary | ICD-10-CM | POA: Diagnosis present

## 2023-05-29 DIAGNOSIS — Z8249 Family history of ischemic heart disease and other diseases of the circulatory system: Secondary | ICD-10-CM

## 2023-05-29 DIAGNOSIS — N179 Acute kidney failure, unspecified: Principal | ICD-10-CM | POA: Diagnosis present

## 2023-05-29 DIAGNOSIS — I214 Non-ST elevation (NSTEMI) myocardial infarction: Principal | ICD-10-CM | POA: Diagnosis present

## 2023-05-29 DIAGNOSIS — R0902 Hypoxemia: Secondary | ICD-10-CM | POA: Diagnosis not present

## 2023-05-29 DIAGNOSIS — Z95820 Peripheral vascular angioplasty status with implants and grafts: Secondary | ICD-10-CM

## 2023-05-29 DIAGNOSIS — E854 Organ-limited amyloidosis: Secondary | ICD-10-CM | POA: Diagnosis present

## 2023-05-29 DIAGNOSIS — Z882 Allergy status to sulfonamides status: Secondary | ICD-10-CM

## 2023-05-29 DIAGNOSIS — I252 Old myocardial infarction: Secondary | ICD-10-CM

## 2023-05-29 DIAGNOSIS — R001 Bradycardia, unspecified: Secondary | ICD-10-CM | POA: Diagnosis not present

## 2023-05-29 DIAGNOSIS — Y92009 Unspecified place in unspecified non-institutional (private) residence as the place of occurrence of the external cause: Secondary | ICD-10-CM | POA: Diagnosis not present

## 2023-05-29 DIAGNOSIS — Z90710 Acquired absence of both cervix and uterus: Secondary | ICD-10-CM

## 2023-05-29 DIAGNOSIS — W19XXXA Unspecified fall, initial encounter: Secondary | ICD-10-CM | POA: Diagnosis present

## 2023-05-29 DIAGNOSIS — I340 Nonrheumatic mitral (valve) insufficiency: Secondary | ICD-10-CM | POA: Diagnosis present

## 2023-05-29 DIAGNOSIS — I441 Atrioventricular block, second degree: Secondary | ICD-10-CM | POA: Diagnosis present

## 2023-05-29 DIAGNOSIS — Z87820 Personal history of traumatic brain injury: Secondary | ICD-10-CM

## 2023-05-29 DIAGNOSIS — R0602 Shortness of breath: Secondary | ICD-10-CM | POA: Diagnosis present

## 2023-05-29 DIAGNOSIS — Z1152 Encounter for screening for COVID-19: Secondary | ICD-10-CM

## 2023-05-29 DIAGNOSIS — R531 Weakness: Secondary | ICD-10-CM | POA: Diagnosis present

## 2023-05-29 DIAGNOSIS — D509 Iron deficiency anemia, unspecified: Secondary | ICD-10-CM | POA: Diagnosis not present

## 2023-05-29 DIAGNOSIS — G588 Other specified mononeuropathies: Secondary | ICD-10-CM | POA: Diagnosis present

## 2023-05-29 DIAGNOSIS — Z85828 Personal history of other malignant neoplasm of skin: Secondary | ICD-10-CM

## 2023-05-29 DIAGNOSIS — R3915 Urgency of urination: Secondary | ICD-10-CM | POA: Diagnosis present

## 2023-05-29 DIAGNOSIS — Z515 Encounter for palliative care: Secondary | ICD-10-CM | POA: Diagnosis not present

## 2023-05-29 DIAGNOSIS — E785 Hyperlipidemia, unspecified: Secondary | ICD-10-CM | POA: Diagnosis not present

## 2023-05-29 DIAGNOSIS — Z8673 Personal history of transient ischemic attack (TIA), and cerebral infarction without residual deficits: Secondary | ICD-10-CM

## 2023-05-29 DIAGNOSIS — J9811 Atelectasis: Secondary | ICD-10-CM | POA: Diagnosis not present

## 2023-05-29 DIAGNOSIS — S32591A Other specified fracture of right pubis, initial encounter for closed fracture: Secondary | ICD-10-CM | POA: Diagnosis present

## 2023-05-29 DIAGNOSIS — Z7982 Long term (current) use of aspirin: Secondary | ICD-10-CM

## 2023-05-29 DIAGNOSIS — Z66 Do not resuscitate: Secondary | ICD-10-CM | POA: Diagnosis present

## 2023-05-29 DIAGNOSIS — D500 Iron deficiency anemia secondary to blood loss (chronic): Secondary | ICD-10-CM | POA: Diagnosis present

## 2023-05-29 DIAGNOSIS — Z79899 Other long term (current) drug therapy: Secondary | ICD-10-CM

## 2023-05-29 DIAGNOSIS — Z79890 Hormone replacement therapy (postmenopausal): Secondary | ICD-10-CM

## 2023-05-29 DIAGNOSIS — M5416 Radiculopathy, lumbar region: Secondary | ICD-10-CM | POA: Diagnosis present

## 2023-05-29 DIAGNOSIS — R52 Pain, unspecified: Secondary | ICD-10-CM | POA: Diagnosis not present

## 2023-05-29 DIAGNOSIS — K254 Chronic or unspecified gastric ulcer with hemorrhage: Secondary | ICD-10-CM | POA: Diagnosis present

## 2023-05-29 DIAGNOSIS — W1830XA Fall on same level, unspecified, initial encounter: Secondary | ICD-10-CM | POA: Diagnosis present

## 2023-05-29 DIAGNOSIS — Z96651 Presence of right artificial knee joint: Secondary | ICD-10-CM | POA: Diagnosis present

## 2023-05-29 DIAGNOSIS — Z91040 Latex allergy status: Secondary | ICD-10-CM

## 2023-05-29 LAB — CBC
HCT: 24.2 % — ABNORMAL LOW (ref 36.0–46.0)
Hemoglobin: 6.9 g/dL — ABNORMAL LOW (ref 12.0–15.0)
MCH: 22.5 pg — ABNORMAL LOW (ref 26.0–34.0)
MCHC: 28.5 g/dL — ABNORMAL LOW (ref 30.0–36.0)
MCV: 78.8 fL — ABNORMAL LOW (ref 80.0–100.0)
Platelets: 287 10*3/uL (ref 150–400)
RBC: 3.07 MIL/uL — ABNORMAL LOW (ref 3.87–5.11)
RDW: 16.6 % — ABNORMAL HIGH (ref 11.5–15.5)
WBC: 8.3 10*3/uL (ref 4.0–10.5)
nRBC: 0 % (ref 0.0–0.2)

## 2023-05-29 LAB — T4, FREE: Free T4: 1.26 ng/dL — ABNORMAL HIGH (ref 0.61–1.12)

## 2023-05-29 LAB — COMPREHENSIVE METABOLIC PANEL
ALT: 88 U/L — ABNORMAL HIGH (ref 0–44)
AST: 186 U/L — ABNORMAL HIGH (ref 15–41)
Albumin: 3.3 g/dL — ABNORMAL LOW (ref 3.5–5.0)
Alkaline Phosphatase: 107 U/L (ref 38–126)
Anion gap: 13 (ref 5–15)
BUN: 38 mg/dL — ABNORMAL HIGH (ref 8–23)
CO2: 21 mmol/L — ABNORMAL LOW (ref 22–32)
Calcium: 8 mg/dL — ABNORMAL LOW (ref 8.9–10.3)
Chloride: 101 mmol/L (ref 98–111)
Creatinine, Ser: 2.31 mg/dL — ABNORMAL HIGH (ref 0.44–1.00)
GFR, Estimated: 20 mL/min — ABNORMAL LOW (ref >60)
Glucose, Bld: 157 mg/dL — ABNORMAL HIGH (ref 70–99)
Potassium: 4.3 mmol/L (ref 3.5–5.1)
Sodium: 135 mmol/L (ref 135–145)
Total Bilirubin: 0.7 mg/dL (ref 0.3–1.2)
Total Protein: 7.1 g/dL (ref 6.5–8.1)

## 2023-05-29 LAB — URINALYSIS, ROUTINE W REFLEX MICROSCOPIC
Bilirubin Urine: NEGATIVE
Glucose, UA: NEGATIVE mg/dL
Hgb urine dipstick: NEGATIVE
Ketones, ur: NEGATIVE mg/dL
Nitrite: NEGATIVE
Protein, ur: 30 mg/dL — AB
Specific Gravity, Urine: 1.021 (ref 1.005–1.030)
WBC, UA: >50 WBC/hpf (ref 0–5)
pH: 5 (ref 5.0–8.0)

## 2023-05-29 LAB — CBC WITH DIFFERENTIAL/PLATELET
Abs Immature Granulocytes: 0.05 10*3/uL (ref 0.00–0.07)
Basophils Absolute: 0.1 10*3/uL (ref 0.0–0.1)
Basophils Relative: 1 %
Eosinophils Absolute: 0.2 10*3/uL (ref 0.0–0.5)
Eosinophils Relative: 2 %
HCT: 25.7 % — ABNORMAL LOW (ref 36.0–46.0)
Hemoglobin: 7.3 g/dL — ABNORMAL LOW (ref 12.0–15.0)
Immature Granulocytes: 1 %
Lymphocytes Relative: 17 %
Lymphs Abs: 1.3 10*3/uL (ref 0.7–4.0)
MCH: 22.5 pg — ABNORMAL LOW (ref 26.0–34.0)
MCHC: 28.4 g/dL — ABNORMAL LOW (ref 30.0–36.0)
MCV: 79.3 fL — ABNORMAL LOW (ref 80.0–100.0)
Monocytes Absolute: 0.5 10*3/uL (ref 0.1–1.0)
Monocytes Relative: 6 %
Neutro Abs: 5.7 10*3/uL (ref 1.7–7.7)
Neutrophils Relative %: 73 %
Platelets: 315 10*3/uL (ref 150–400)
RBC: 3.24 MIL/uL — ABNORMAL LOW (ref 3.87–5.11)
RDW: 16.5 % — ABNORMAL HIGH (ref 11.5–15.5)
WBC: 7.7 10*3/uL (ref 4.0–10.5)
nRBC: 0 % (ref 0.0–0.2)

## 2023-05-29 LAB — PROTIME-INR
INR: 1.4 — ABNORMAL HIGH (ref 0.8–1.2)
Prothrombin Time: 17.2 s — ABNORMAL HIGH (ref 11.4–15.2)

## 2023-05-29 LAB — BASIC METABOLIC PANEL
Anion gap: 10 (ref 5–15)
BUN: 37 mg/dL — ABNORMAL HIGH (ref 8–23)
CO2: 23 mmol/L (ref 22–32)
Calcium: 7.9 mg/dL — ABNORMAL LOW (ref 8.9–10.3)
Chloride: 103 mmol/L (ref 98–111)
Creatinine, Ser: 2.08 mg/dL — ABNORMAL HIGH (ref 0.44–1.00)
GFR, Estimated: 23 mL/min — ABNORMAL LOW (ref >60)
Glucose, Bld: 101 mg/dL — ABNORMAL HIGH (ref 70–99)
Potassium: 4.5 mmol/L (ref 3.5–5.1)
Sodium: 136 mmol/L (ref 135–145)

## 2023-05-29 LAB — LIPID PANEL
Cholesterol: 88 mg/dL (ref 0–200)
HDL: 51 mg/dL (ref >40)
LDL Cholesterol: 29 mg/dL (ref 0–99)
Total CHOL/HDL Ratio: 1.7 {ratio}
Triglycerides: 41 mg/dL (ref <150)
VLDL: 8 mg/dL (ref 0–40)

## 2023-05-29 LAB — TSH: TSH: 8.783 u[IU]/mL — ABNORMAL HIGH (ref 0.350–4.500)

## 2023-05-29 LAB — FERRITIN: Ferritin: 13 ng/mL (ref 11–307)

## 2023-05-29 LAB — IRON AND TIBC
Iron: 36 ug/dL (ref 28–170)
Saturation Ratios: 8 % — ABNORMAL LOW (ref 10.4–31.8)
TIBC: 433 ug/dL (ref 250–450)
UIBC: 397 ug/dL

## 2023-05-29 LAB — SARS CORONAVIRUS 2 BY RT PCR: SARS Coronavirus 2 by RT PCR: NEGATIVE

## 2023-05-29 LAB — TROPONIN I (HIGH SENSITIVITY)
Troponin I (High Sensitivity): 301 ng/L (ref <18)
Troponin I (High Sensitivity): 266 ng/L (ref <18)

## 2023-05-29 LAB — MAGNESIUM: Magnesium: 2 mg/dL (ref 1.7–2.4)

## 2023-05-29 LAB — PREPARE RBC (CROSSMATCH)

## 2023-05-29 LAB — FOLATE: Folate: 9.8 ng/mL (ref >5.9)

## 2023-05-29 LAB — HEMOGLOBIN AND HEMATOCRIT, BLOOD
HCT: 27.6 % — ABNORMAL LOW (ref 36.0–46.0)
HCT: 26.9 % — ABNORMAL LOW (ref 36.0–46.0)
Hemoglobin: 8.2 g/dL — ABNORMAL LOW (ref 12.0–15.0)
Hemoglobin: 8.3 g/dL — ABNORMAL LOW (ref 12.0–15.0)

## 2023-05-29 MED ORDER — HEPARIN BOLUS VIA INFUSION
4000.0000 [IU] | Freq: Once | INTRAVENOUS | Status: AC
  Administered 2023-05-29: 4000 [IU] via INTRAVENOUS
  Filled 2023-05-29: qty 4000

## 2023-05-29 MED ORDER — DEXTROSE-SODIUM CHLORIDE 5-0.9 % IV SOLN: INTRAVENOUS | Status: DC

## 2023-05-29 MED ORDER — DEXTROSE-SODIUM CHLORIDE 5-0.9 % IV SOLN: INTRAVENOUS | Status: AC

## 2023-05-29 MED ORDER — POTASSIUM GLUCONATE 595 (99 K) MG PO TABS: 595.0000 mg | ORAL_TABLET | Freq: Every evening | ORAL | Status: DC | PRN

## 2023-05-29 MED ORDER — ALPRAZOLAM 0.25 MG PO TABS
0.2500 mg | ORAL_TABLET | Freq: Two times a day (BID) | ORAL | Status: DC | PRN
  Filled 2023-05-29: qty 1

## 2023-05-29 MED ORDER — NITROGLYCERIN 0.4 MG SL SUBL: 0.4000 mg | SUBLINGUAL_TABLET | SUBLINGUAL | Status: DC | PRN

## 2023-05-29 MED ORDER — HEPARIN (PORCINE) 25000 UT/250ML-% IV SOLN
950.0000 [IU]/h | INTRAVENOUS | Status: DC
  Administered 2023-05-29: 950 [IU]/h via INTRAVENOUS
  Filled 2023-05-29: qty 250

## 2023-05-29 MED ORDER — ASPIRIN 81 MG PO CHEW
81.0000 mg | CHEWABLE_TABLET | Freq: Every day | ORAL | Status: DC
  Administered 2023-05-30 – 2023-06-04 (×6): 81 mg via ORAL
  Filled 2023-05-29 (×6): qty 1

## 2023-05-29 MED ORDER — DONEPEZIL HCL 5 MG PO TABS
10.0000 mg | ORAL_TABLET | Freq: Every day | ORAL | Status: DC
  Administered 2023-05-29 – 2023-06-05 (×8): 10 mg via ORAL
  Filled 2023-05-29 (×9): qty 2

## 2023-05-29 MED ORDER — SODIUM CHLORIDE 0.9% IV SOLUTION
Freq: Once | INTRAVENOUS | Status: AC
  Filled 2023-05-29: qty 250

## 2023-05-29 MED ORDER — DULOXETINE HCL 30 MG PO CPEP
60.0000 mg | ORAL_CAPSULE | Freq: Every day | ORAL | Status: DC
  Administered 2023-05-30 – 2023-06-06 (×8): 60 mg via ORAL
  Filled 2023-05-29 (×8): qty 2

## 2023-05-29 MED ORDER — LEVOTHYROXINE SODIUM 25 MCG PO TABS
25.0000 ug | ORAL_TABLET | Freq: Every day | ORAL | Status: DC
  Administered 2023-05-29 – 2023-06-05 (×8): 25 ug via ORAL
  Filled 2023-05-29 (×8): qty 1

## 2023-05-29 MED ORDER — VITAMIN D 25 MCG (1000 UNIT) PO TABS
1000.0000 [IU] | ORAL_TABLET | Freq: Every day | ORAL | Status: DC
  Administered 2023-05-30 – 2023-06-06 (×8): 1000 [IU] via ORAL
  Filled 2023-05-29 (×8): qty 1

## 2023-05-29 MED ORDER — ACETAMINOPHEN 325 MG PO TABS
650.0000 mg | ORAL_TABLET | ORAL | Status: DC | PRN
  Administered 2023-05-29 – 2023-06-05 (×12): 650 mg via ORAL
  Filled 2023-05-29 (×12): qty 2

## 2023-05-29 MED ORDER — ASPIRIN 81 MG PO TBEC: 81.0000 mg | DELAYED_RELEASE_TABLET | Freq: Every day | ORAL | Status: DC

## 2023-05-29 MED ORDER — ISOSORBIDE MONONITRATE ER 30 MG PO TB24
30.0000 mg | ORAL_TABLET | Freq: Every day | ORAL | Status: DC
  Administered 2023-05-30 – 2023-06-06 (×8): 30 mg via ORAL
  Filled 2023-05-29 (×8): qty 1

## 2023-05-29 MED ORDER — ATORVASTATIN CALCIUM 80 MG PO TABS
80.0000 mg | ORAL_TABLET | Freq: Every day | ORAL | Status: DC
  Administered 2023-05-29 – 2023-06-05 (×8): 80 mg via ORAL
  Filled 2023-05-29 (×8): qty 1

## 2023-05-29 MED ORDER — ONDANSETRON HCL 4 MG/2ML IJ SOLN
INTRAMUSCULAR | Status: AC
  Administered 2023-05-29: 4 mg via INTRAVENOUS
  Filled 2023-05-29: qty 2

## 2023-05-29 MED ORDER — SODIUM CHLORIDE 0.9% IV SOLUTION
Freq: Once | INTRAVENOUS | Status: DC
  Filled 2023-05-29: qty 250

## 2023-05-29 MED ORDER — HEPARIN SODIUM (PORCINE) 5000 UNIT/ML IJ SOLN
4000.0000 [IU] | Freq: Once | INTRAMUSCULAR | Status: DC
  Filled 2023-05-29: qty 1

## 2023-05-29 MED ORDER — PANTOPRAZOLE SODIUM 40 MG PO TBEC
40.0000 mg | DELAYED_RELEASE_TABLET | Freq: Every day | ORAL | Status: DC
  Administered 2023-05-29 – 2023-06-03 (×6): 40 mg via ORAL
  Filled 2023-05-29 (×6): qty 1

## 2023-05-29 MED ORDER — ASPIRIN 81 MG PO CHEW
324.0000 mg | CHEWABLE_TABLET | Freq: Once | ORAL | Status: AC
  Administered 2023-05-29: 324 mg via ORAL
  Filled 2023-05-29: qty 4

## 2023-05-29 MED ORDER — MAGNESIUM HYDROXIDE 400 MG/5ML PO SUSP: 30.0000 mL | Freq: Every day | ORAL | Status: DC | PRN

## 2023-05-29 MED ORDER — TRAZODONE HCL 50 MG PO TABS
50.0000 mg | ORAL_TABLET | Freq: Every day | ORAL | Status: DC
  Administered 2023-05-29 – 2023-06-05 (×8): 50 mg via ORAL
  Filled 2023-05-29 (×8): qty 1

## 2023-05-29 MED ORDER — LACTATED RINGERS IV BOLUS
1000.0000 mL | Freq: Once | INTRAVENOUS | Status: AC
  Administered 2023-05-29: 1000 mL via INTRAVENOUS

## 2023-05-29 MED ORDER — GABAPENTIN 100 MG PO CAPS
100.0000 mg | ORAL_CAPSULE | Freq: Three times a day (TID) | ORAL | Status: DC
  Administered 2023-05-29 – 2023-06-06 (×24): 100 mg via ORAL
  Filled 2023-05-29 (×24): qty 1

## 2023-05-29 MED ORDER — METOPROLOL SUCCINATE ER 25 MG PO TB24: 25.0000 mg | ORAL_TABLET | Freq: Every day | ORAL | Status: DC

## 2023-05-29 MED ORDER — ONDANSETRON HCL 4 MG/2ML IJ SOLN
4.0000 mg | Freq: Four times a day (QID) | INTRAMUSCULAR | Status: DC | PRN
  Administered 2023-06-03 – 2023-06-06 (×2): 4 mg via INTRAVENOUS
  Filled 2023-05-29 (×2): qty 2

## 2023-05-29 MED ORDER — ASPIRIN 300 MG RE SUPP: 300.0000 mg | RECTAL | Status: DC

## 2023-05-29 MED ORDER — ROPINIROLE HCL 1 MG PO TABS
0.5000 mg | ORAL_TABLET | Freq: Every day | ORAL | Status: DC
  Administered 2023-05-29 – 2023-06-05 (×8): 0.5 mg via ORAL
  Filled 2023-05-29: qty 2
  Filled 2023-05-29 (×8): qty 1

## 2023-05-29 MED ORDER — ASPIRIN 81 MG PO CHEW: 324.0000 mg | CHEWABLE_TABLET | ORAL | Status: DC

## 2023-05-29 MED ORDER — MORPHINE SULFATE (PF) 2 MG/ML IV SOLN
2.0000 mg | INTRAVENOUS | Status: DC | PRN
  Administered 2023-05-29 – 2023-06-04 (×6): 2 mg via INTRAVENOUS
  Filled 2023-05-29 (×6): qty 1

## 2023-05-29 NOTE — ED Notes (Signed)
ED Provider at bedside. 

## 2023-05-29 NOTE — Assessment & Plan Note (Signed)
-  We will continue Aricept.

## 2023-05-29 NOTE — Assessment & Plan Note (Addendum)
-   This associated with generalized weakness and dizziness. - This could be related to second or third degree AV block based on EKG. - It could be secondary to NSTEMI as well that could be the culprit for her AV block. - She has subsequent right inferior pubic ramus fracture.
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
## 2023-05-29 NOTE — ED Notes (Signed)
Pt states she doesn't have to pee at this time. Rn notified pt and rn that md would like a urine sample and when she needs to go to use the call light if staff not already in the room.
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
## 2023-05-29 NOTE — Plan of Care (Signed)
  Problem: Cardiac: Goal: Ability to achieve and maintain adequate cardiovascular perfusion will improve Outcome: Progressing   Problem: Education: Goal: Knowledge of General Education information will improve Description: Including pain rating scale, medication(s)/side effects and non-pharmacologic comfort measures Outcome: Progressing   Problem: Health Behavior/Discharge Planning: Goal: Ability to manage health-related needs will improve Outcome: Progressing   Problem: Nutrition: Goal: Adequate nutrition will be maintained Outcome: Progressing   Problem: Coping: Goal: Level of anxiety will decrease Outcome: Progressing   Problem: Elimination: Goal: Will not experience complications related to bowel motility Outcome: Progressing Goal: Will not experience complications related to urinary retention Outcome: Progressing   Problem: Pain Managment: Goal: General experience of comfort will improve Outcome: Progressing   Problem: Safety: Goal: Ability to remain free from injury will improve Outcome: Progressing   Problem: Skin Integrity: Goal: Risk for impaired skin integrity will decrease Outcome: Progressing

## 2023-05-29 NOTE — Assessment & Plan Note (Signed)
-   This is likely prerenal. - The patient will be hydrated while she is here and will follow BMP. - We will avoid nephrotoxins.

## 2023-05-29 NOTE — Progress Notes (Signed)
Called to evaluate ECG for possible temp wire in setting of CHB. ECG shows possible intermittent 2nd degree AVB with rates >55 bpm without hemodynamic instability. Patient also with new AKI. No indication for emergency temp wire at this time, will observe closely for now.

## 2023-05-29 NOTE — ED Triage Notes (Signed)
Patient brought in tonight from home after a fall at home. Patient was getting up to go to the bathroom using her rollator this evening when she had a witnessed fall into the wall. Denies LOC or use of blood thinners. Patient does complaint of right scapula and hip pain, also slight head pain. Daughter reports that over the past week she has noticed increasing weakness and also she has been slightly more confused than normal. Patient endorses that she fell due to being dizzy.

## 2023-05-29 NOTE — ED Notes (Signed)
Family updated as to patient's status.

## 2023-05-29 NOTE — ED Notes (Signed)
MD Sreenath at bedside.

## 2023-05-29 NOTE — ED Notes (Signed)
Assisted patient to bedside commode and back to bed at this time

## 2023-05-29 NOTE — Assessment & Plan Note (Signed)
-  We will continue PPI therapy

## 2023-05-29 NOTE — Progress Notes (Signed)
Brief nonbillable note.  Hospitalist update note.  Please see same-day H&P for full billable details.  Briefly, this is an 89 year old Caucasian female with history sniffing for osteoarthritis, anxiety, CAD, GERD, hypertension, hyperlipidemia, CVA who presents to the ED with acute onset of generalized weakness and dizziness with subsequent fall.  Per patient she has been feeling increasingly weak however did not report any deterioration in her oral intake or fluid intake.  She presented after mechanical fall.  At this time unable to exclude syncopal event.  Was associated with generalized weakness and dizziness.  Unfortunately fall resulted in right inferior pubic ramus fracture.  Patient also had elevation of troponin concerning for NSTEMI.  Initially started on heparin gtt.  Cardiology consulted.  Stopped heparin as no plans for ischemic evaluation and patient receiving blood transfusion.  Patient has acute on chronic anemia and acute kidney injury.  Etiologies are somewhat unclear.  Will hydrate and transfuse and monitor as appropriate.  Kelly Patella MD  No charge  Care plan discussed with daughter Kelly Ritter [phone] on 10/17

## 2023-05-29 NOTE — Assessment & Plan Note (Signed)
-   The patient will be admitted to a progressive unit bed. - Will follow serial troponins and EKGs. - The patient will be placed on aspirin as well as p.r.n. sublingual nitroglycerin and morphine sulfate for pain. - We will obtain a cardiology consult in a.m. for further cardiac risk stratification. - I notified Dr. Melton Alar about the patient.

## 2023-05-29 NOTE — ED Notes (Signed)
Update given to patient's daughter Herbert Seta via telephone. Pt's daughter requesting that doctor gives her a call with any updates in her care. MD Sreenath notified via secure chat.

## 2023-05-29 NOTE — ED Provider Notes (Signed)
[facility] Provider Note    Event Date/Time   First MD Initiated Contact with Patient 05/29/23 0114     (approximate)   History   Fall, Weakness, and Shortness of Breath   HPI  Kelly Ritter is a 89 y.o. female who presents to the ED for evaluation of Fall, Weakness, and Shortness of Breath   Review of medical DC summary from 1 month ago.  She was admitted overnight due to unstable angina.  Extensive coronary history, PAD, CVA, HTN and HLD.  She was started on Imdur, no other interventions during this admission.  Patient presents to the ED from home via EMS after a fall.  Lives with her daughter who provides supplemental history.  For the past few days, nearly a week, she has had intermittent episodes of generalized weakness, dizziness.   She denies chest pain.  She reports falling to the right side tonight and has some right hip pain.  No syncope   Physical Exam   Triage Vital Signs: ED Triage Vitals  Encounter Vitals Group     BP --      Systolic BP Percentile --      Diastolic BP Percentile --      Pulse Rate 05/29/23 0112 (!) 56     Resp 05/29/23 0112 18     Temp 05/29/23 0112 (!) 97.4 F (36.3 C)     Temp Source 05/29/23 0112 Oral     SpO2 05/29/23 0109 95 %     Weight --      Height --      Head Circumference --      Peak Flow --      Pain Score 05/29/23 0114 0     Pain Loc --      Pain Education --      Exclude from Growth Chart --     Most recent vital signs: Vitals:   05/29/23 0130 05/29/23 0200  BP: (!) 114/90 (!) 129/50  Pulse: 68 71  Resp: 17 20  Temp:    SpO2: 98% 98%    General: Awake, no distress.  Well-appearing and pleasant CV:  Good peripheral perfusion.  Resp:  Normal effort.  Abd:  No distention.  MSK:  No deformity noted.  Neuro:  No focal deficits appreciated. Other:     ED Results / Procedures / Treatments   Labs (all labs ordered are listed, but only abnormal results are displayed) Labs  Reviewed  COMPREHENSIVE METABOLIC PANEL - Abnormal; Notable for the following components:      Result Value   CO2 21 (*)    Glucose, Bld 157 (*)    BUN 38 (*)    Creatinine, Ser 2.31 (*)    Calcium 8.0 (*)    Albumin 3.3 (*)    AST 186 (*)    ALT 88 (*)    GFR, Estimated 20 (*)    All other components within normal limits  CBC WITH DIFFERENTIAL/PLATELET - Abnormal; Notable for the following components:   RBC 3.24 (*)    Hemoglobin 7.3 (*)    HCT 25.7 (*)    MCV 79.3 (*)    MCH 22.5 (*)    MCHC 28.4 (*)    RDW 16.5 (*)    All other components within normal limits  TSH - Abnormal; Notable for the following components:   TSH 8.783 (*)    All other components within normal limits  T4, FREE - Abnormal; Notable for  the following components:   Free T4 1.26 (*)    All other components within normal limits  PROTIME-INR - Abnormal; Notable for the following components:   Prothrombin Time 17.2 (*)    INR 1.4 (*)    All other components within normal limits  TROPONIN I (HIGH SENSITIVITY) - Abnormal; Notable for the following components:   Troponin I (High Sensitivity) 301 (*)    All other components within normal limits  SARS CORONAVIRUS 2 BY RT PCR  MAGNESIUM  URINALYSIS, ROUTINE W REFLEX MICROSCOPIC  TYPE AND SCREEN    EKG Multiple EKGs demonstrates what appears to be a sinus rhythm, though irregular and questionable AV block.  No STEMI.  RADIOLOGY CXR interpreted by me without evidence of acute cardiopulmonary pathology. Plain film of the pelvis interpreted by me with a right sided inferior pubic ramus fracture.  Official radiology report(s): No results found.  PROCEDURES and INTERVENTIONS:  .Critical Care  Performed by: Delton Prairie, MD Authorized by: Delton Prairie, MD   Critical care provider statement:    Critical care time (minutes):  30   Critical care time was exclusive of:  Separately billable procedures and treating other patients   Critical care was  necessary to treat or prevent imminent or life-threatening deterioration of the following conditions:  Cardiac failure   Critical care was time spent personally by me on the following activities:  Development of treatment plan with patient or surrogate, discussions with consultants, evaluation of patient's response to treatment, examination of patient, ordering and review of laboratory studies, ordering and review of radiographic studies, ordering and performing treatments and interventions, pulse oximetry, re-evaluation of patient's condition and review of old charts .1-3 Lead EKG Interpretation  Performed by: Delton Prairie, MD Authorized by: Delton Prairie, MD     Interpretation: abnormal     ECG rate:  54   ECG rate assessment: normal     Rhythm: A-V block     Ectopy: none     Conduction: normal     Medications  heparin bolus via infusion 4,000 Units (has no administration in time range)  heparin ADULT infusion 100 units/mL (25000 units/275mL) (has no administration in time range)  lactated ringers bolus 1,000 mL (1,000 mLs Intravenous New Bag/Given 05/29/23 0225)  aspirin chewable tablet 324 mg (324 mg Oral Given 05/29/23 0225)     IMPRESSION / MDM / ASSESSMENT AND PLAN / ED COURSE  I reviewed the triage vital signs and the nursing notes.  Differential diagnosis includes, but is not limited to, syncope, seizure, hip fracture, pelvic fracture, NSTEMI, complete heart block or other high-grade AV block  {Patient presents with symptoms of an acute illness or injury that is potentially life-threatening.  89 year old woman with history of coronary disease presents after what sounds like a mechanical fall, possibly symptomatic AV block versus dehydration with an AKI, microcytic anemia and NSTEMI requiring medical admission.  She has no neurologic deficits, traumatically she is complaining of right hip pain but she is fully ranging her hip, and a pelvic x-ray demonstrates an inferior pubic  ramus fracture.   Her EKG does not have acute ischemic features, but has irregular conduction of P waves concerning for an AV block: At times I question complete heart block versus second-degree type II, though it is inconsistent.  I consult cardiology who agrees it is unusual and she may eventually require a pacemaker, but no indications for any emergent temporary wires tonight.  Her troponin returns quite elevated so we will provide  aspirin and initiate heparin.  She has no active chest pain.  AKI appears prerenal and she does appear dry on exam so we will start IV fluids.  I do suspect that her hemoglobin will drop further with dilution and may require a transfusion.  She has no bleeding symptoms and not on anticoagulation.  A consult medicine who agrees to admit  Clinical Course as of 05/29/23 0242  Thu May 29, 2023  0140 reassessed [DS]  0213 I consult with Dr. Darrold Junker .  We discussed presentation, he reviews charts as well as all EKGs.  No need for an emergent temp wire, unusual rhythm.  [DS]  0234 I consult with medicine who agrees to admit [DS]    Clinical Course User Index [DS] Delton Prairie, MD     FINAL CLINICAL IMPRESSION(S) / ED DIAGNOSES   Final diagnoses:  NSTEMI (non-ST elevated myocardial infarction) (HCC)  AKI (acute kidney injury) (HCC)  Closed fracture of right inferior pubic ramus, initial encounter (HCC)     Rx / DC Orders   ED Discharge Orders     None        Note:  This document was prepared using Dragon voice recognition software and may include unintentional dictation errors.   Delton Prairie, MD 05/29/23 [phone]

## 2023-05-29 NOTE — Assessment & Plan Note (Signed)
-  We will continue Synthroid.

## 2023-05-29 NOTE — Progress Notes (Addendum)
Upon skin assessment, pt noted to have moderate sized 1in x1in unusual growth near R groin/labia, different shades of color and unusual texture. Pt denies awareness of its existence and states she hasn't been able to see 'down there' in sometime. Pt encouraged to inform doctor of its existence, including a dermatologist (outpatient).   Upon review of pt's history, it is noted pt has had multiple instances of Squamous cell carcinoma most recently 2021.

## 2023-05-29 NOTE — Assessment & Plan Note (Signed)
-  We will follow LFTs with hydration.

## 2023-05-29 NOTE — Assessment & Plan Note (Addendum)
-   We will continue statin therapy. 

## 2023-05-29 NOTE — TOC Initial Note (Addendum)
Transition of Care [facility]) - Initial/Assessment Note    Patient Details  Name: Kelly Ritter MRN: [id number] Date of Birth: 1933-10-27  Transition of Care [facility]) CM/SW Contact:    Marquita Palms, LCSW Phone Number: 05/29/2023, 11:23 AM  Clinical Narrative:                  Health Risk Assessment completed by CSW. CSW met with patient bedside. Patient reports that she uses [facility] for her medication. She reports her daughter built onto her house for her and her husband. She reports she has a BSC and walker in the home. She would like to have home health at home when she goes home. She did not have a certain HH agency to use. CSW sent message to MD to see if she is appropriate for [facility]. Awaiting response from MD.  Expected Discharge Plan: Home w Home Health Services Barriers to Discharge: Continued Medical Work up   Patient Goals and CMS Choice            Expected Discharge Plan and Services       Living arrangements for the past 2 months: Single Family Home                                      Prior Living Arrangements/Services Living arrangements for the past 2 months: Single Family Home                     Activities of Daily Living      Permission Sought/Granted                  Emotional Assessment Appearance:: Disheveled Attitude/Demeanor/Rapport: Gracious Affect (typically observed): Calm Orientation: : Oriented to Self, Oriented to Place, Oriented to  Time, Oriented to Situation      Admission diagnosis:  NSTEMI (non-ST elevated myocardial infarction) [facility]) [I21.4] Patient Active Problem List   Diagnosis Date Noted   NSTEMI (non-ST elevated myocardial infarction) (HCC) 05/29/2023   AKI (acute kidney injury) (HCC) 05/29/2023   Dyslipidemia 05/29/2023   Dementia with behavioral disturbance (HCC) 05/29/2023   GERD without esophagitis 05/29/2023   Hypothyroidism 05/29/2023   Elevated transaminase level 05/29/2023   Closed  fracture of right inferior pubic ramus (HCC) 05/29/2023   Fall at home, initial encounter 05/29/2023   Chest pain 04/24/2023   Anemia 04/23/2023   Hyperkalemia 12/23/2020   Cauda equina compression (HCC) 12/21/2020   Spinal stenosis of lumbar region with neurogenic claudication 06/22/2020   Status post total right knee replacement 03/19/2020   Restless legs syndrome 01/19/2019   Mood disorder (HCC) 01/19/2019   Peripheral polyneuropathy 11/17/2018   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   Bilateral carpal tunnel syndrome 08/18/2018   Mixed hyperlipidemia 07/26/2018   GERD (gastroesophageal reflux disease) 07/26/2018   Osteoporosis of forearm 04/02/2018   B12 deficiency 08/14/2017   Prediabetes 08/13/2017   Athscl heart disease of native cor art w oth ang pctrs (HCC) 05/06/2017   SOBOE (shortness of breath on exertion) 02/26/2017   Anxiety, generalized 02/03/2017   Post-concussion headache 02/03/2017   Elevated TSH 10/22/2016   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HCC) 10/22/2016   Primary osteoarthritis of hand 01/09/2016   Arthralgia of multiple sites 12/18/2015   RSD (reflex sympathetic dystrophy) 12/18/2015   Unstable angina (HCC) 09/24/2015   Personal history of other malignant neoplasm of skin 08/02/2015   Benign essential hypertension  01/06/2015   Cerebral infarction (HCC) 12/12/2014   Bilateral carotid artery stenosis 10/21/2014   Other allergic rhinitis 10/21/2014   Paroxysmal supraventricular tachycardia (HCC) 07/05/2014   PCP:  Norval Morton, MD Pharmacy:   [facility] [phone] - [place], [place] - [address] AT [facility] OF [address] [place] [address] [place] [address] Phone: [phone] Fax: [phone]     Social Determinants of Health (SDOH) Social History: SDOH Screenings   Food Insecurity: No Food Insecurity (02/26/2022)   Received from [facility], [facility]  Housing: Low Risk  (03/22/2020)   Transportation Needs: No Transportation Needs (02/26/2022)   Received from [facility], [facility]  Alcohol Screen: Low Risk  (03/22/2020)  Depression (PHQ2-9): Medium Risk (03/22/2020)  Financial Resource Strain: Low Risk  (02/26/2022)   Received from [facility], [facility]  Physical Activity: Inactive (03/22/2020)  Social Connections: Unknown (03/22/2020)  Stress: No Stress Concern Present (03/22/2020)  Tobacco Use: Medium Risk (04/23/2023)   SDOH Interventions:     Readmission Risk Interventions    05/29/2023   11:19 AM  Readmission Risk Prevention Plan  Transportation Screening Complete  PCP or Specialist Appt within 3-5 Days Complete  HRI or Home Care Consult Complete  Social Work Consult for Recovery Care Planning/Counseling Complete  Palliative Care Screening Not Applicable  Medication Review (RN Care Manager) Not Complete  Med Review Comments Patient will discuss with nurse and MD upon discharge

## 2023-05-30 DIAGNOSIS — R52 Pain, unspecified: Secondary | ICD-10-CM

## 2023-05-30 DIAGNOSIS — S32591A Other specified fracture of right pubis, initial encounter for closed fracture: Secondary | ICD-10-CM | POA: Diagnosis not present

## 2023-05-30 DIAGNOSIS — Z515 Encounter for palliative care: Secondary | ICD-10-CM | POA: Diagnosis not present

## 2023-05-30 DIAGNOSIS — N179 Acute kidney failure, unspecified: Secondary | ICD-10-CM | POA: Diagnosis not present

## 2023-05-30 DIAGNOSIS — I214 Non-ST elevation (NSTEMI) myocardial infarction: Secondary | ICD-10-CM | POA: Diagnosis not present

## 2023-05-30 LAB — CBC WITH DIFFERENTIAL/PLATELET
Abs Immature Granulocytes: 0.02 10*3/uL (ref 0.00–0.07)
Basophils Absolute: 0.1 10*3/uL (ref 0.0–0.1)
Basophils Relative: 1 %
Eosinophils Absolute: 0.4 10*3/uL (ref 0.0–0.5)
Eosinophils Relative: 6 %
HCT: 29.4 % — ABNORMAL LOW (ref 36.0–46.0)
Hemoglobin: 8.8 g/dL — ABNORMAL LOW (ref 12.0–15.0)
Immature Granulocytes: 0 %
Lymphocytes Relative: 14 %
Lymphs Abs: 0.8 10*3/uL (ref 0.7–4.0)
MCH: 24.2 pg — ABNORMAL LOW (ref 26.0–34.0)
MCHC: 29.9 g/dL — ABNORMAL LOW (ref 30.0–36.0)
MCV: 80.8 fL (ref 80.0–100.0)
Monocytes Absolute: 0.5 10*3/uL (ref 0.1–1.0)
Monocytes Relative: 9 %
Neutro Abs: 4 10*3/uL (ref 1.7–7.7)
Neutrophils Relative %: 70 %
Platelets: 251 10*3/uL (ref 150–400)
RBC: 3.64 MIL/uL — ABNORMAL LOW (ref 3.87–5.11)
RDW: 16.9 % — ABNORMAL HIGH (ref 11.5–15.5)
WBC: 5.8 10*3/uL (ref 4.0–10.5)
nRBC: 0 % (ref 0.0–0.2)

## 2023-05-30 LAB — BASIC METABOLIC PANEL
Anion gap: 8 (ref 5–15)
BUN: 33 mg/dL — ABNORMAL HIGH (ref 8–23)
CO2: 23 mmol/L (ref 22–32)
Calcium: 7.9 mg/dL — ABNORMAL LOW (ref 8.9–10.3)
Chloride: 105 mmol/L (ref 98–111)
Creatinine, Ser: 1.67 mg/dL — ABNORMAL HIGH (ref 0.44–1.00)
GFR, Estimated: 29 mL/min — ABNORMAL LOW (ref >60)
Glucose, Bld: 138 mg/dL — ABNORMAL HIGH (ref 70–99)
Potassium: 4.5 mmol/L (ref 3.5–5.1)
Sodium: 136 mmol/L (ref 135–145)

## 2023-05-30 LAB — BPAM RBC
Blood Product Expiration Date: 202411132359
ISSUE DATE / TIME: 202410170815
Unit Type and Rh: 5100

## 2023-05-30 LAB — TYPE AND SCREEN
ABO/RH(D): O POS
Antibody Screen: NEGATIVE
Unit division: 0

## 2023-05-30 LAB — PREPARE RBC (CROSSMATCH)

## 2023-05-30 LAB — VITAMIN B12: Vitamin B-12: 457 pg/mL (ref 180–914)

## 2023-05-30 MED ORDER — POLYETHYLENE GLYCOL 3350 17 G PO PACK
17.0000 g | PACK | Freq: Every day | ORAL | Status: DC | PRN
  Administered 2023-05-30 – 2023-06-01 (×3): 17 g via ORAL
  Filled 2023-05-30 (×3): qty 1

## 2023-05-30 MED ORDER — SODIUM CHLORIDE 0.9 % IV SOLN
300.0000 mg | Freq: Once | INTRAVENOUS | Status: AC
  Administered 2023-05-30: 300 mg via INTRAVENOUS
  Filled 2023-05-30: qty 300

## 2023-05-30 MED ORDER — OXYCODONE HCL 5 MG PO TABS
5.0000 mg | ORAL_TABLET | ORAL | Status: DC | PRN
  Administered 2023-05-30 – 2023-06-05 (×13): 5 mg via ORAL
  Filled 2023-05-30 (×13): qty 1

## 2023-05-30 MED ORDER — ADULT MULTIVITAMIN W/MINERALS CH
1.0000 | ORAL_TABLET | Freq: Every day | ORAL | Status: DC
  Administered 2023-05-30 – 2023-06-06 (×8): 1 via ORAL
  Filled 2023-05-30 (×8): qty 1

## 2023-05-30 MED ORDER — ENSURE ENLIVE PO LIQD
237.0000 mL | Freq: Two times a day (BID) | ORAL | Status: DC
  Administered 2023-05-31 – 2023-06-05 (×8): 237 mL via ORAL

## 2023-05-30 NOTE — Plan of Care (Signed)
[facility] Tonsillectomy/Adenoidectomy, Postoperative PEDS care plan entered in error.

## 2023-05-30 NOTE — Progress Notes (Signed)
PROGRESS NOTE    JOURNEE RAYA  GMW:[id number] DOB: [birth year] DOA: 05/29/2023 PCP: Norval Morton, MD    Brief Narrative:   89 year old Caucasian female with history sniffing for osteoarthritis, anxiety, CAD, GERD, hypertension, hyperlipidemia, CVA who presents to the ED with acute onset of generalized weakness and dizziness with subsequent fall.  Per patient she has been feeling increasingly weak however did not report any deterioration in her oral intake or fluid intake.   She presented after mechanical fall.  At this time unable to exclude syncopal event.  Was associated with generalized weakness and dizziness.  Unfortunately fall resulted in right inferior pubic ramus fracture.   Patient also had elevation of troponin concerning for NSTEMI.  Initially started on heparin gtt.  Cardiology consulted.  Stopped heparin as no plans for ischemic evaluation and patient receiving blood transfusion.  Patient has acute on chronic anemia and acute kidney injury.  Etiologies are somewhat unclear.  Will hydrate and transfuse and monitor as appropriate.  10/18: Creatinine improving.  Hb stable   Assessment & Plan:   Principal Problem:   NSTEMI (non-ST elevated myocardial infarction) (HCC) Active Problems:   AKI (acute kidney injury) (HCC)   Fall at home, initial encounter   Dyslipidemia   Elevated transaminase level   Dementia with behavioral disturbance (HCC)   GERD without esophagitis   Hypothyroidism   Closed fracture of right inferior pubic ramus (HCC)  Elevated troponin NSTEMI ruled out.  Troponins peaked at 300.  Subsequently downtrending.  Suspect supply/demand ischemia.  Seen by cardiology.  Hyper gtt. stopped.  No indication for ischemic evaluation. Plan: EKG as needed chest pain  AKI Suspect prerenal azotemia in the setting of poor p.o. intake and poor hydration.  Improved with IV fluids. Plan: Hold IV fluids for now Recheck creatinine in a.m.  Acute on chronic  iron deficiency anemia No signs of acute blood loss.  Patient's hemoglobin 6.9.  Microcytosis.  Iron indices indicative of iron deficiency anemia. Plan: Hemoglobin stable.  No indication for transfusion currently.  Will give IV Venofer 300 mg x 1 followed by initiation of p.o. iron replacement 10/19  Mechanical fall Generalized weakness Right inferior pubic ramus fracture As needed pain control PT and OT consults May benefit from skilled nursing facility placement  Hyperlipidemia Statin  Hypothyroidism Synthroid  GERD PPI  Dementia with behavioral disturbance Aricept   DVT prophylaxis: SQ Lovenox Code Status: DNR Family Communication: Daughter at bedside 10/18 Disposition Plan: Status is: Inpatient Remains inpatient appropriate because: Unsafe discharge plan.  Pending PT and OT evaluations and disposition plan.   Level of care: Telemetry Cardiac  Consultants:  None  Procedures:  None  Antimicrobials: None   Subjective: Patient seen and examined.  Resting in bed.  In better spirits this morning.  Does endorse pain in the right hip.  Objective: Vitals:   05/29/23 2339 05/30/23 0604 05/30/23 0824 05/30/23 1216  BP: 115/72 (!) 156/47 (!) 173/70 (!) 139/51  Pulse: 80 93 85 77  Resp: 18 20    Temp: 98 F (36.7 C) 98 F (36.7 C) 97.9 F (36.6 C) 97.6 F (36.4 C)  TempSrc: Oral Oral    SpO2: 95% 95% 92% 94%  Weight:      Height:        Intake/Output Summary (Last 24 hours) at 05/30/2023 1256 Last data filed at 05/30/2023 0604 Gross per 24 hour  Intake 908.75 ml  Output --  Net 908.75 ml   [employer]  05/29/23 0114  Weight: 82.8 kg    Examination:  General exam: Appears calm and comfortable  Respiratory system: Clear to auscultation. Respiratory effort normal. Cardiovascular system: S1-S2, RRR, no murmurs, no pedal edema Gastrointestinal system: Soft, NT/ND, normal bowel sounds Central nervous system: Alert and oriented. No focal  neurological deficits. Extremities: Symmetric 5 x 5 power.  Right hip pain Skin: No rashes, lesions or ulcers Psychiatry: Judgement and insight appear normal. Mood & affect appropriate.     Data Reviewed: I have personally reviewed following labs and imaging studies  CBC: Recent Labs  Lab 05/29/23 0115 05/29/23 0449 05/29/23 1141 05/29/23 1612 05/30/23 0829  WBC 7.7 8.3  --   --  5.8  NEUTROABS 5.7  --   --   --  4.0  HGB 7.3* 6.9* 8.2* 8.3* 8.8*  HCT 25.7* 24.2* 27.6* 26.9* 29.4*  MCV 79.3* 78.8*  --   --  80.8  PLT 315 287  --   --  251   Basic Metabolic Panel: Recent Labs  Lab 05/29/23 0115 05/29/23 0449 05/30/23 0829  NA 135 136 136  K 4.3 4.5 4.5  CL 101 103 105  CO2 21* 23 23  GLUCOSE 157* 101* 138*  BUN 38* 37* 33*  CREATININE 2.31* 2.08* 1.67*  CALCIUM 8.0* 7.9* 7.9*  MG 2.0  --   --    GFR: Estimated Creatinine Clearance: 25 mL/min (A) (by C-G formula based on SCr of 1.67 mg/dL (H)). Liver Function Tests: Recent Labs  Lab 05/29/23 0115  AST 186*  ALT 88*  ALKPHOS 107  BILITOT 0.7  PROT 7.1  ALBUMIN 3.3*   No results for input(s): "LIPASE", "AMYLASE" in the last 168 hours. No results for input(s): "AMMONIA" in the last 168 hours. Coagulation Profile: Recent Labs  Lab 05/29/23 0115  INR 1.4*   Cardiac Enzymes: No results for input(s): "CKTOTAL", "CKMB", "CKMBINDEX", "TROPONINI" in the last 168 hours. BNP (last 3 results) No results for input(s): "PROBNP" in the last 8760 hours. HbA1C: No results for input(s): "HGBA1C" in the last 72 hours. CBG: No results for input(s): "GLUCAP" in the last 168 hours. Lipid Profile: Recent Labs    05/29/23 0449  CHOL 88  HDL 51  LDLCALC 29  TRIG 41  CHOLHDL 1.7   Thyroid Function Tests: Recent Labs    05/29/23 0115  TSH 8.783*  FREET4 1.26*   Anemia Panel: Recent Labs    05/29/23 1612  VITAMINB12 457  FOLATE 9.8  FERRITIN 13  TIBC 433  IRON 36   Sepsis Labs: No results for  input(s): "PROCALCITON", "LATICACIDVEN" in the last 168 hours.  Recent Results (from the past 240 hour(s))  SARS Coronavirus 2 by RT PCR (hospital order, performed in [facility] hospital lab) *cepheid single result test* Anterior Nasal Swab     Status: None   Collection Time: 05/29/23  1:46 AM   Specimen: Anterior Nasal Swab  Result Value Ref Range Status   SARS Coronavirus 2 by RT PCR NEGATIVE NEGATIVE Final    Comment: (NOTE) SARS-CoV-2 target nucleic acids are NOT DETECTED.  The SARS-CoV-2 RNA is generally detectable in upper and lower respiratory specimens during the acute phase of infection. The lowest concentration of SARS-CoV-2 viral copies this assay can detect is 250 copies / mL. A negative result does not preclude SARS-CoV-2 infection and should not be used as the sole basis for treatment or other patient management decisions.  A negative result may occur with improper specimen collection /  handling, submission of specimen other than nasopharyngeal swab, presence of viral mutation(s) within the areas targeted by this assay, and inadequate number of viral copies (<250 copies / mL). A negative result must be combined with clinical observations, patient history, and epidemiological information.  Fact Sheet for Patients:   RoadLapTop.co.za  Fact Sheet for Healthcare Providers: http://kim-miller.com/  This test is not yet approved or  cleared by the [place] FDA and has been authorized for detection and/or diagnosis of SARS-CoV-2 by FDA under an Emergency Use Authorization (EUA).  This EUA will remain in effect (meaning this test can be used) for the duration of the COVID-19 declaration under Section 564(b)(1) of the Act, 21 U.S.C. section 360bbb-3(b)(1), unless the authorization is terminated or revoked sooner.  Performed at [facility], [address] [place], [place] [address]          Radiology  Studies: DG Pelvis Portable  Result Date: 05/29/2023 CLINICAL DATA:  89 year old female with history of trauma from a fall. Right-sided pelvic pain. EXAM: PORTABLE PELVIS 1-2 VIEWS COMPARISON:  No priors. FINDINGS: Minimally displaced fracture of the right inferior pubic ramus. Probable nondisplaced fracture of the right superior pubic ramus adjacent to the pubic symphysis. Visualized portions of the femurs bilaterally appear intact and femoral heads appear located on this single view examination. Degenerative changes of osteoarthritis are noted in both hip joints. IMPRESSION: 1. Right sided pubic rami fractures, as above. 2. Moderate to severe bilateral hip joint osteoarthritis. Electronically Signed   By: Trudie Reed M.D.   On: 05/29/2023 05:27   DG Chest Portable 1 View  Result Date: 05/29/2023 CLINICAL DATA:  89 year old female with history of shortness of breath. Fall with back pain. Evaluate for pneumothorax. EXAM: PORTABLE CHEST 1 VIEW COMPARISON:  Chest x-ray 04/23/2023. FINDINGS: Lung volumes are normal. No consolidative airspace disease. No pleural effusions. No pneumothorax. No pulmonary nodule or mass noted. Pulmonary vasculature and the cardiomediastinal silhouette are within normal limits. Extensive atherosclerosis in the thoracic aorta. IMPRESSION: 1.  No radiographic evidence of acute cardiopulmonary disease. 2. Extensive aortic atherosclerosis. Electronically Signed   By: Trudie Reed M.D.   On: 05/29/2023 05:22   CT HEAD WO CONTRAST ( )  Result Date: 05/29/2023 CLINICAL DATA:  Larey Seat into the wall, increasing weakness and dizziness EXAM: CT HEAD WITHOUT CONTRAST TECHNIQUE: Contiguous axial images were obtained from the base of the skull through the vertex without intravenous contrast. RADIATION DOSE REDUCTION: This exam was performed according to the departmental dose-optimization program which includes automated exposure control, adjustment of the mA and/or kV according to  patient size and/or use of iterative reconstruction technique. COMPARISON:  10/14/2017 FINDINGS: Brain: No evidence of acute infarction, hemorrhage, mass, mass effect, or midline shift. No hydrocephalus or extra-axial fluid collection. Periventricular white matter changes, likely the sequela of chronic small vessel ischemic disease. Age related cerebral atrophy. Vascular: No hyperdense vessel. Skull: Negative for fracture or focal lesion. Sinuses/Orbits: No acute finding. Other: The mastoid air cells are well aerated. IMPRESSION: No acute intracranial process. Electronically Signed   By: Wiliam Ke M.D.   On: 05/29/2023 04:12        Scheduled Meds:  sodium chloride   Intravenous Once   aspirin  81 mg Oral Daily   atorvastatin  80 mg Oral QHS   cholecalciferol  1,000 Units Oral Daily   donepezil  10 mg Oral QHS   DULoxetine  60 mg Oral Daily   feeding supplement  237 mL Oral BID BM  gabapentin  100 mg Oral TID   isosorbide mononitrate  30 mg Oral Daily   levothyroxine  25 mcg Oral Q0600   multivitamin with minerals  1 tablet Oral Daily   pantoprazole  40 mg Oral QHS   rOPINIRole  0.5 mg Oral QHS   traZODone  50 mg Oral QHS   Continuous Infusions:   LOS: 1 day    Tresa Moore, MD [facility]   If 7PM-7AM, please contact night-coverage  05/30/2023, 12:56 PM

## 2023-05-30 NOTE — Consult Note (Signed)
Consultation Note Date: 05/30/2023   Patient Name: Kelly Ritter  DOB: 18-May-1934  MRN: [id number]  Age / Sex: 89 y.o., female  PCP: Leanord Asal, Nelva Bush, MD Referring Physician: Tresa Moore, MD  Reason for Consultation: Establishing goals of care   HPI/Brief Hospital Course: 89 y.o. female  with past medical history of CAD s/p DES mid LAD, proximal to mid LCx and mid RCA, amyloidosis, PVD, CVA, HTN, HLD admitted from home on 05/29/2023 with weakness, dizziness and fall. Reports has experienced generalized weakness and episodes of dizziness for several weeks.  Found to have right inferior pubic ramus fracture-medical management Concern for STEMI-heparin started but discontinued due to worsening anemia s/p 1 unit PRBC also with AKI  10/18 Hemoglobin improved and remains stable Creatinine also improving (1.67) baseline appears to be around 0.8  Palliative medicine was consulted for assisting with goals of care conversations.  Subjective:  Extensive chart review has been completed prior to meeting patient including labs, vital signs, imaging, progress notes, orders, and available advanced directive documents from current and previous encounters.  Visited with Kelly Ritter at her bedside. Sitting up in chair, awake, alert and oriented with moments of confusion but easily redirected. Daughter-Heather at bedside.  Introduced myself as a Publishing rights manager as a member of the palliative care team. Explained palliative medicine is specialized medical care for people living with serious illness. It focuses on providing relief from the symptoms and stress of a serious illness. The goal is to improve quality of life for both the patient and the family.   Daughter works as Dealer, she is familiar with Palliative medicine and requested consult to assist with GOC conversations.  Kelly Ritter currently lives alone with multiple family members and friends available  to assist her and check in. She enjoys being active on days she is feeling well and spending time with her dog. Herbert Seta shares she has noted a significant functional decline over the last 6 months.  Kelly Ritter has not completed Advanced Directive documents in the past but is interested in completing them. Provided blue booklet to daughter, reviewed documents and chaplain paged to assist with completion. Kelly Ritter wishes to appoint her daughter-Heather as HCPOA.  Attempted to elicit goals of care. Kelly Ritter shares her goal is to return home with a focus on comfort with an understanding the time she has left is likely limited. She shares she feels she has lived a good life and is ready when her time comes. Herbert Seta shares initially after discharge they are interested in STR for Kelly Ritter to regain some strength and mobility and allow for a safe transition to home.  We discussed the difference between Palliative Medicine and Hospice services. We discussed the overall philosophy of hospice. At this time, with STR being discharge plan will recommend outpatient palliative to follow with a transition to home with hospice when leaving STR if/when Kelly Ritter and family feels is most appropriate. Kelly Ritter and Herbert Seta both in agreement with plan. Referrals placed.  Kelly Ritter with complaints of pelvic discomfort. Recently received dose of IV morphine as well as PO acetaminophen which help to alleviate the pain. Will add PO oxycodone PRN for pain management.  I discussed importance of continued conversations with family/support persons and all members of their medical team regarding overall plan of care and treatment options ensuring decisions are in alignment with patients goals of care.  All questions/concerns addressed.PMT will continue to follow and support patient as needed.  Objective:  Primary Diagnoses: Present on Admission:  NSTEMI (non-ST elevated myocardial infarction) [facility])   Physical  Exam Constitutional:      General: She is not in acute distress.    Appearance: She is not ill-appearing.  Pulmonary:     Effort: Pulmonary effort is normal. No respiratory distress.  Neurological:     Mental Status: She is alert.     Motor: Weakness present.  Psychiatric:        Mood and Affect: Mood normal.        Behavior: Behavior normal.        Thought Content: Thought content normal.     Vital Signs: BP (!) 139/51 (BP Location: Right Arm)   Pulse 77   Temp 97.6 F (36.4 C)   Resp 20   Ht 5' 4.5" (1.638 m)   Wt 82.8 kg   SpO2 94%   BMI 30.85 kg/m  Pain Scale: 0-10 POSS *See Group Information*: S-Acceptable,Sleep, easy to arouse Pain Score: 3   IO: Intake/output summary:  Intake/Output Summary (Last 24 hours) at 05/30/2023 1314 Last data filed at 05/30/2023 0604 Gross per 24 hour  Intake 908.75 ml  Output --  Net 908.75 ml    LBM: Last BM Date : 05/27/23 Baseline Weight: Weight: 82.8 kg Most recent weight: Weight: 82.8 kg       Assessment and Plan  SUMMARY OF RECOMMENDATIONS   DNR/DNI Outpatient palliative to follow at d/c with transition to home hospice when appropriate Adding Oxycodone 5mg  PO q4h PRN for pain management Chaplain paged for AD completion PMT to continue to follow for ongoing needs and support  Palliative Prophylaxis:   Bowel Regimen, Delirium Protocol and Frequent Pain Assessment  Discussed With: Primary team   Thank you for this consult and allowing Palliative Medicine to participate in the care of Kelly Ritter. Palliative medicine will continue to follow and assist as needed.   Time Total: 75 minutes  Time spent includes: Detailed review of medical records (labs, imaging, vital signs), medically appropriate exam (mental status, respiratory, cardiac, skin), discussed with treatment team, counseling and educating patient, family and staff, documenting clinical information, medication management and coordination of  care.   Signed by: Leeanne Deed, DNP, AGNP-C Palliative Medicine    Please contact Palliative Medicine Team phone at [phone] for questions and concerns.  For individual provider: See Loretha Stapler

## 2023-05-30 NOTE — Progress Notes (Addendum)
Initial Nutrition Assessment  DOCUMENTATION CODES:   Obesity unspecified  INTERVENTION:   -Continue dysphagia 3 diet with thin liquids -MVI with minerals daily -Ensure Enlive po BID, each supplement provides 350 kcal and 20 grams of protein.   NUTRITION DIAGNOSIS:   Inadequate oral intake related to poor appetite as evidenced by per patient/family report.  GOAL:   Patient will meet greater than or equal to 90% of their needs  MONITOR:   PO intake, Supplement acceptance  REASON FOR ASSESSMENT:   Consult Assessment of nutrition requirement/status  ASSESSMENT:   Pt with medical history significant for osteoarthritis, anxiety, coronary artery disease, GERD, hypertension, dyslipidemia, and CVA, who presented with acute onset of generalized weakness and dizziness and subsequent fall.  Pt admitted with NSTEMI, AKI, and s/p fall.   10/17- s/p BSE- advanced to dysphagia 3 diet with thin liquids  Reviewed I/O's: +1.5 L x 24 hours and +2.5 L since admission  Case discussed with SLP, who reports concern of pt's PO intake and would like RD to assess for supplements.   Spoke with pt at bedside, who was pleasant and in good spirits today. Pt shares that she has a poor appetite at baseline, which she has had for about a year. Pt admits to early satiety and often eats 1-2 meals per day, which consists of a small pimento cheese sandwich. Pt consumed about 75% of breakfast, including a cheese omelette and sausage.   Pt denies any weight loss. No wt loss noted per wt hx. Pt shares that she had difficult time gaining weight for the majority of her life, however, "that's not a problem now". Pt reports fair mobility using a walker; she no longer drives, but lives independently in in an in law suite in her daughter's home with her dog.   Discussed importance of good meal and supplement intake to promote healing. Pt amenable to Ensure supplements. She reports she has drank them in the past, but  not consistently.   Palliative care consult pending to discuss goals of care.   Medications reviewed and include vitamin D3 and neurontin.   Lab Results  Component Value Date   HGBA1C 6.2 (H) 04/24/2023   PTA DM medications are none.   Labs reviewed: CBGS: 204 (inpatient orders for glycemic control are none).    NUTRITION - FOCUSED PHYSICAL EXAM:  Flowsheet Row Most Recent Value  Orbital Region Mild depletion  Upper Arm Region Mild depletion  Thoracic and Lumbar Region No depletion  Buccal Region Mild depletion  Temple Region Mild depletion  Clavicle Bone Region No depletion  Clavicle and Acromion Bone Region No depletion  Scapular Bone Region Mild depletion  Dorsal Hand No depletion  Patellar Region No depletion  Anterior Thigh Region No depletion  Posterior Calf Region No depletion  Edema (RD Assessment) Mild  Hair Reviewed  Eyes Reviewed  Mouth Reviewed  Skin Reviewed  Nails Reviewed       Diet Order:   Diet Order             DIET DYS 3 Room service appropriate? Yes with Assist; Fluid consistency: Thin  Diet effective now                   EDUCATION NEEDS:   Education needs have been addressed  Skin:  Skin Assessment: Reviewed RN Assessment  Last BM:  05/27/23  Height:   Ht Readings from Last 1 Encounters:  05/29/23 5' 4.5" (1.638 m)    Weight:  Wt Readings from Last 1 Encounters:  05/29/23 82.8 kg    Ideal Body Weight:  55.7 kg  BMI:  Body mass index is 30.85 kg/m.  Estimated Nutritional Needs:   Kcal:  1650-1850  Protein:  85-100 grams  Fluid:  > 1.6 L    Levada Schilling, RD, LDN, CDCES Registered Dietitian III Certified Diabetes Care and Education Specialist Please refer to [facility] for RD and/or RD on-call/weekend/after hours pager

## 2023-05-30 NOTE — TOC Progression Note (Signed)
Transition of Care [facility]) - Progression Note    Patient Details  Name: Kelly Ritter MRN: [id number] Date of Birth: 03/01/1934  [facility]) CM/SW Contact  Truddie Hidden, RN Phone Number: 05/30/2023, 11:52 AM  Clinical Narrative:    Spoke with patient and her daughter. Patient is requesting her daughter to be her HCPOA. Chaplian consulted.     Expected Discharge Plan: Home w Home Health Services Barriers to Discharge: Continued Medical Work up  Expected Discharge Plan and Services       Living arrangements for the past 2 months: Single Family Home                                       Social Determinants of Health (SDOH) Interventions SDOH Screenings   Food Insecurity: No Food Insecurity (05/29/2023)  Housing: Low Risk  (05/29/2023)  Transportation Needs: No Transportation Needs (05/29/2023)  Utilities: Not At Risk (05/29/2023)  Alcohol Screen: Low Risk  (03/22/2020)  Depression (PHQ2-9): Medium Risk (03/22/2020)  Financial Resource Strain: Low Risk  (02/26/2022)   Received from [facility], [facility]  Physical Activity: Inactive (03/22/2020)  Social Connections: Unknown (03/22/2020)  Stress: No Stress Concern Present (03/22/2020)  Tobacco Use: Medium Risk (05/29/2023)    Readmission Risk Interventions    05/29/2023   11:19 AM  Readmission Risk Prevention Plan  Transportation Screening Complete  PCP or Specialist Appt within 3-5 Days Complete  HRI or Home Care Consult Complete  Social Work Consult for Recovery Care Planning/Counseling Complete  Palliative Care Screening Not Applicable  Medication Review Oceanographer) Not Complete  Med Review Comments Patient will discuss with nurse and MD upon discharge

## 2023-05-30 NOTE — Progress Notes (Signed)
   05/30/23 1600  Spiritual Encounters  Type of Visit Initial;Follow up  Care provided to: Pt and family  Conversation partners present during encounter Nurse  Referral source Family  Reason for visit Advance directives  OnCall Visit Yes  Advance Directives (For Healthcare)  Does Patient Have a Medical Advance Directive? Yes  Type of Estate agent of [place];Living will  Copy of Healthcare Power of Attorney in Chart? Yes - validated most recent copy scanned in chart (See row information)  Copy of Living Will in Chart? Yes - validated most recent copy scanned in chart (See row information)   CH received [place] consult and page from RN to assist family with advance directive completion/notarization.  CH visited at approx 1:30pm and verified that form was complete.  CH attempted to coordinate notarization of document but all notaries contacted initially said they were unavailable.  CH was reconsulted later in the afternoon as family had secured their own notary.  Document notarized in the presence of witnesses, scanned and filed in paper chart.  Chaplains remain available as needed.

## 2023-05-30 NOTE — Evaluation (Signed)
Physical Therapy Evaluation Patient Details Name: Kelly Ritter MRN: [id number] DOB: July 24, 1934 Today's Date: 05/30/2023  History of Present Illness  89 year old Caucasian female with history sniffing for osteoarthritis, anxiety, CAD, GERD, hypertension, hyperlipidemia, CVA who presents to the ED with acute onset of generalized weakness and dizziness with subsequent fall. Patient also had elevation of troponin, suspect supply/demand ischemia. 05/28/24 Xray Pelvis: Minimally displaced fracture of the right inferior pubic ramus.  Probable nondisplaced fracture of the right superior pubic ramus adjacent to the pubic symphysis.  Clinical Impression  Patient resting in bed upon arrival; alert and oriented, follows commands and agreeable to participation with treatment session.  Per FACES scale, pain 4/10 R hip/groin; meds received prior to session.  Chronic ROM limitations to R shoulder and R ankle (baseline orthopedic issues).  LEs, otherwise, grossly WFL for strength and ROM, generally limited by pain in hips/groin with active movement attempts. Currently requiring min assist for bed mobility; min/mod assist for sit/stand, standing balance and bed/chair transfer with RW.  Demonstrates definite use of UEs on RW to support and offload LEs; limited tolerance for WBing R LE, but does not buckle.  Additional gait distance deferred due to pain and generalized weakness/dizziness with transition to upright (vitals stable and WFL, no orthostasis). Would benefit from skilled PT to address above deficits and promote optimal return to PLOF.; recommend post-acute PT follow up as indicated by interdisciplinary care team.          If plan is discharge home, recommend the following: A lot of help with walking and/or transfers;A lot of help with bathing/dressing/bathroom   Can travel by private vehicle   Yes    Equipment Recommendations    Recommendations for Other Services       Functional Status Assessment  Patient has had a recent decline in their functional status and demonstrates the ability to make significant improvements in function in a reasonable and predictable amount of time.     Precautions / Restrictions Precautions Precautions: Fall Restrictions Weight Bearing Restrictions: Yes RLE Weight Bearing: Weight bearing as tolerated      Mobility  Bed Mobility Overal bed mobility: Needs Assistance Bed Mobility: Supine to Sit     Supine to sit: Min assist          Transfers Overall transfer level: Needs assistance Equipment used: Rolling walker (2 wheels) Transfers: Sit to/from Stand, Bed to chair/wheelchair/BSC Sit to Stand: Mod assist Stand pivot transfers: Min assist, +2 physical assistance         General transfer comment: definite use of UEs on RW to support and offload LEs; limited tolerance for WBing R LE, but does not buckle    Ambulation/Gait               General Gait Details: deferred due to pain and generalized dizziness/weakness once upright  Stairs            Wheelchair Mobility     Tilt Bed    Modified Rankin (Stroke Patients Only)       Balance Overall balance assessment: Needs assistance Sitting-balance support: No upper extremity supported, Feet supported Sitting balance-Leahy Scale: Fair     Standing balance support: Bilateral upper extremity supported Standing balance-Leahy Scale: Fair                               Pertinent Vitals/Pain Pain Assessment Pain Assessment: Faces Faces Pain Scale: Hurts little more Pain Location: R  hip/groin Pain Descriptors / Indicators: Grimacing Pain Intervention(s): Limited activity within patient's tolerance, Monitored during session, Repositioned    Home Living Family/patient expects to be discharged to:: Private residence Living Arrangements: Alone Available Help at Discharge: Family;Friend(s);Available PRN/intermittently Type of Home: House Home Access: Level  entry       Home Layout: One level Home Equipment: Agricultural consultant (2 wheels);Rollator (4 wheels);Cane - single point Additional Comments: Lives in attached, but indep, apartment at daughter's home    Prior Function Prior Level of Function : Independent/Modified Independent;Driving             Mobility Comments: no AD or 4WW if feeling weak ADLs Comments: light cleaning and cooking     Extremity/Trunk Assessment   Upper Extremity Assessment Upper Extremity Assessment:  (R shoulder elevation to shoulder-height only (chronic, baseline); otherwise, grossly WFL)    Lower Extremity Assessment Lower Extremity Assessment:  (grossly 4-/5 throughout, increased time/effort due to pain in groin/pelvis with movement and wBing)       Communication   Communication Communication: No apparent difficulties  Cognition Arousal: Alert Behavior During Therapy: WFL for tasks assessed/performed Overall Cognitive Status: Within Functional Limits for tasks assessed                                          General Comments General comments (skin integrity, edema, etc.): Vitals checked with BP stable, no orthostatics    Exercises     Assessment/Plan    PT Assessment Patient needs continued PT services  PT Problem List Decreased activity tolerance;Decreased balance;Decreased mobility;Decreased coordination;Decreased safety awareness;Decreased knowledge of precautions;Decreased range of motion;Decreased strength;Pain       PT Treatment Interventions DME instruction;Gait training;Functional mobility training;Therapeutic activities;Therapeutic exercise;Balance training;Patient/family education    PT Goals (Current goals can be found in the Care Plan section)  Acute Rehab PT Goals Patient Stated Goal: to be able to get back home PT Goal Formulation: With patient/family Time For Goal Achievement: 06/13/23 Potential to Achieve Goals: Good    Frequency Min 1X/week      Co-evaluation   Reason for Co-Treatment: For patient/therapist safety;To address functional/ADL transfers PT goals addressed during session: Mobility/safety with mobility OT goals addressed during session: ADL's and self-care       AM-PAC PT "6 Clicks" Mobility  Outcome Measure Help needed turning from your back to your side while in a flat bed without using bedrails?: A Little Help needed moving from lying on your back to sitting on the side of a flat bed without using bedrails?: A Little Help needed moving to and from a bed to a chair (including a wheelchair)?: A Little Help needed standing up from a chair using your arms (e.g., wheelchair or bedside chair)?: A Lot Help needed to walk in hospital room?: A Lot Help needed climbing 3-5 steps with a railing? : A Lot 6 Click Score: 15    End of Session Equipment Utilized During Treatment: (P) Gait belt Activity Tolerance: Patient tolerated treatment well;Patient limited by pain Patient left: in chair;with call bell/phone within reach;with chair alarm set Nurse Communication: Mobility status PT Visit Diagnosis: Muscle weakness (generalized) (M62.81);Difficulty in walking, not elsewhere classified (R26.2)    Time: 7425-9563 PT Time Calculation (min) (ACUTE ONLY): 32 min   Charges:   PT Evaluation $PT Eval Moderate Complexity: 1 Mod   PT General Charges $$ ACUTE PT VISIT: 1 Visit  Colburn Asper H. Manson Passey, PT, DPT, NCS 05/30/23, 3:34 PM [phone]

## 2023-05-30 NOTE — Progress Notes (Signed)
Speech Language Pathology Treatment: Dysphagia  Patient Details Name: Kelly Ritter MRN: [id number] DOB: 1934-02-17 Today's Date: 05/30/2023 Time: 1135-1205 SLP Time Calculation (min) (ACUTE ONLY): 30 min  Assessment / Plan / Recommendation Clinical Impression  Pt seen for f/u today of toleration of diet; education re: swallowing/precautions. Pt awake, verbal and engaged easily w/ this SLP. Daughter present in room. Pt was distracted intermittently and talked often but redirected easily to task w/ cue. Pt fed self w/ setup and positioning upright in bed. She voiced anxiety over swallowing Pills "at home alone" for many months/years now. Daughter agreed.  Pt on RA, afebrile. WBC WNL.    Pt appears to present w/ functional oropharyngeal phase swallowing when following general aspiration precautions and given support w/ setup. No overt oropharyngeal phase dysphagia noted. No neuromuscular deficits noted. Pt consumed po trials w/ No overt, clinical s/s of aspiration during po trials.  Pt appears at reduced risk for aspiration following general aspiration precautions. However, pt does have challenging factors that could impact her oropharyngeal swallowing to include anxiety about swallowing/Pill swallowing, Esophageal phase Dysmotility suspected including Hiatal Hernia and GERD, deconditioning/weakness, reduced oral intake/appetite in general and acute illness/hospitalization. Pt is of advanced age at 39y. These factors can increase risk for aspiration, dysphagia as well as decreased oral intake overall.    During po trials, pt instructed to take single, small sips for best control when swallowing. Must sit fully upright in bed/chair for all oral intake. Oral phase appeared [facility] w/ timely bolus management and oral clearing. Pt fed self w/ setup support.  BOTH pt and Daughter endorsed MUCH EASE and REDUCED ANXIETY now swallowing Pills CRUSHED in Puree. Pt endorsed feeling at ease w/ swallowing her  medications CRUSHED in a Puree -- recommended continuing this from now on. BOTH pt and Dtr agreed. Discussion was had w/ Daughter on the impact of pt's Esophageal phase Dysmotility on the oropharyngeal phases of swallowing -- rec'd f/u w/ GI for further education/management if needed. Discussed foods/options/prep. Recommended f/u w/ Dietician for support. Daughter agreed.   Recommend continue a more Mech Soft consistency diet w/ moistened foods, meats MINCED d/t difficulty swallowing meats per pt report; Thin liquids. NO STRAW USE. Recommend general aspiration precautions, reduce Talking/distractions during meals. Sit fully upright during oral intake. Pills CRUSHED w/ those that cannot be crushed given WHOLE in Puree for safer, easier swallowing -- pt has many Pills to swallow and this is a source of Anxiety for her. This method was encouraged now and for D/C to the pt and Family/Dtr.   Education given on Pills in Puree; food consistencies and easy to eat options/prep; REFLUX and general aspiration precautions to pt and Family/Dtr. Handouts given on precautions, dysphagia drink cup for bolus control when drinking liquids discussed. No further acute ST services indicated. NSG to reconsult if any new needs arise. MD/NSG updated, agreed. Recommend Dietician f/u for support.Palliative Care following for GOC.       HPI HPI: Pt is a 89 y.o. Caucasian female with medical history significant for osteoarthritis, anxiety, coronary artery disease, GERD, Hiatal Hernia, hypertension, dyslipidemia, and CVA, who presented to the emergency room with acute onset of generalized weakness and dizziness and subsequent Fall.  Per her daughter who is a FNP she called her son-in-law yesterday stating that she felt terrible and needed help.  Daughter went to her spent the night there.  She stated that she got out of bed and before stepping forward she felt weak and dizzy and  fell on her right side hitting her head against the  door.  She got confused for about a minute without losing consciousness.  Having increasing dyspnea and dizziness lately walking a few feet.  She admits to occasional chest pain and palpitations.  She admits to occasional cough without wheezing.  She has been having diminished appetite with decreased p.o. intake.  She endorsed anxiety w/ Pill swallowing and eating/drinking of certain foods/drinks.  Pt recently seen in this ED last month w/ c/o shortness of breath and chest heaviness ongoing on off and on for the past few days. Pt reported dyspnea on exertion. At this admit, pt was found to have right inferior pubic ramus fracture s/p Fall at home - medical management per chart notes. Also concern for STEMI - heparin started but discontinued due to worsening anemia s/p 1 unit PRBC also with AKI.  Palliative Care is meeting w/ Pt/Daughter for GOC - Dtr endorsed noting a significant functional decline over the last 6 months. Pt lives alone w/ her dog.      SLP Plan  All goals met      Recommendations for follow up therapy are one component of a multi-disciplinary discharge planning process, led by the attending physician.  Recommendations may be updated based on patient status, additional functional criteria and insurance authorization.    Recommendations  Diet recommendations: Dysphagia 3 (mechanical soft);Thin liquid Liquids provided via: Cup;No straw Medication Administration: Crushed with puree (w/ Whole in puree if needed) Supervision: Patient able to self feed;Intermittent supervision to cue for compensatory strategies (setup) Compensations: Minimize environmental distractions;Slow rate;Small sips/bites;Lingual sweep for clearance of pocketing;Multiple dry swallows after each bite/sip;Follow solids with liquid Postural Changes and/or Swallow Maneuvers: Out of bed for meals;Seated upright 90 degrees;Upright 30-60 min after meal (REFLUX/GERD precautions)                 (GI consult for  management/education; Dietician f/u) Oral care BID;Oral care before and after PO;Patient independent with oral care (support Denture care)   Intermittent Supervision/Assistance Dysphagia, unspecified (R13.10) (concern for Esophageal phase Dysmotility; Anxiety)     All goals met        Jerilynn Som, MS, CCC-SLP Speech Language Pathologist Rehab Services; [facility] [phone] (ascom) Larraine Argo  05/30/2023, 1:50 PM

## 2023-05-30 NOTE — Progress Notes (Signed)
[facility] Palliative Referral Note  New referral for palliative care at discharge.  Patient may discharge to short term rehab then home.  Palliative care will follow patient at facility patient chooses.  Thank you for allowing participation in this patient's care.  Norris Cross, RN Nurse Liaison [phone]

## 2023-05-30 NOTE — Evaluation (Signed)
Occupational Therapy Evaluation Patient Details Name: Kelly Ritter MRN: [id number] DOB: [birth year] Today's Date: 05/30/2023   History of Present Illness 89 year old Caucasian female with history sniffing for osteoarthritis, anxiety, CAD, GERD, hypertension, hyperlipidemia, CVA who presents to the ED with acute onset of generalized weakness and dizziness with subsequent fall. Patient also had elevation of troponin, suspect supply/demand ischemia. 05/28/24 Xray Pelvis: Minimally displaced fracture of the right inferior pubic ramus.  Probable nondisplaced fracture of the right superior pubic ramus adjacent to the pubic symphysis.   Clinical Impression   Kelly Ritter was seen for OT evaluation this date. Prior to hospital admission, pt was MOD I using rollator as needed. Pt lives alone in handicap accessible house attached to daughters'. Pt currently requires MIN A bed mobility. MOD A don undwear, assist for pulling up over rear in standing. MIN A x2 + RW for bed>chair step pivot t/f. Pt would benefit from skilled OT to address noted impairments and functional limitations (see below for any additional details). Upon hospital discharge, recommend OT follow up.    If plan is discharge home, recommend the following: A lot of help with walking and/or transfers;A lot of help with bathing/dressing/bathroom;Supervision due to cognitive status    Functional Status Assessment  Patient has had a recent decline in their functional status and demonstrates the ability to make significant improvements in function in a reasonable and predictable amount of time.  Equipment Recommendations  BSC/3in1    Recommendations for Other Services       Precautions / Restrictions Precautions Precautions: Fall Restrictions Weight Bearing Restrictions: Yes RLE Weight Bearing: Weight bearing as tolerated      Mobility Bed Mobility Overal bed mobility: Needs Assistance Bed Mobility: Supine to Sit     Supine to sit:  Min assist          Transfers Overall transfer level: Needs assistance Equipment used: Rolling walker (2 wheels) Transfers: Sit to/from Stand, Bed to chair/wheelchair/BSC Sit to Stand: Mod assist     Step pivot transfers: Min assist, +2 physical assistance            Balance Overall balance assessment: Needs assistance Sitting-balance support: Feet supported, Single extremity supported Sitting balance-Leahy Scale: Fair     Standing balance support: During functional activity, Bilateral upper extremity supported Standing balance-Leahy Scale: Fair                             ADL either performed or assessed with clinical judgement   ADL Overall ADL's : Needs assistance/impaired                                       General ADL Comments: MOD A don undwear, assist for pulling up over rear in standing. MIN A x2 + RW for simulated BSC t/f      Pertinent Vitals/Pain Pain Assessment Pain Assessment: Faces Faces Pain Scale: Hurts little more Pain Location: R hip/groin Pain Descriptors / Indicators: Grimacing Pain Intervention(s): Limited activity within patient's tolerance, Premedicated before session, Repositioned     Extremity/Trunk Assessment Upper Extremity Assessment Upper Extremity Assessment: Overall WFL for tasks assessed   Lower Extremity Assessment Lower Extremity Assessment: Generalized weakness       Communication Communication Communication: No apparent difficulties   Cognition Arousal: Alert Behavior During Therapy: WFL for tasks assessed/performed Overall Cognitive Status: Within Functional Limits for  tasks assessed                                       General Comments  Vitals checked with BP stable, no orthostatics    Exercises     Shoulder Instructions      Home Living Family/patient expects to be discharged to:: Private residence Living Arrangements: Alone Available Help at Discharge:  Family;Friend(s);Available PRN/intermittently Type of Home: House Home Access: Level entry     Home Layout: One level     Bathroom Shower/Tub: Producer, television/film/video: Handicapped height Bathroom Accessibility: Yes   Home Equipment: Agricultural consultant (2 wheels);Rollator (4 wheels);Cane - single point          Prior Functioning/Environment Prior Level of Function : Independent/Modified Independent;Driving             Mobility Comments: no AD or 4WW if feeling weak ADLs Comments: light cleaning and cooking        OT Problem List: Decreased strength;Decreased range of motion;Decreased activity tolerance;Impaired balance (sitting and/or standing);Decreased safety awareness      OT Treatment/Interventions: Self-care/ADL training;Therapeutic exercise;Energy conservation;DME and/or AE instruction;Therapeutic activities;Patient/family education;Balance training    OT Goals(Current goals can be found in the care plan section) Acute Rehab OT Goals Patient Stated Goal: to go home OT Goal Formulation: With patient/family Time For Goal Achievement: 06/13/23 Potential to Achieve Goals: Good ADL Goals Pt Will Perform Grooming: with set-up;with supervision;standing Pt Will Perform Lower Body Dressing: sit to/from stand;with set-up;with supervision Pt Will Transfer to Toilet: with modified independence;ambulating;regular height toilet  OT Frequency: Min 1X/week    Co-evaluation PT/OT/SLP Co-Evaluation/Treatment: Yes Reason for Co-Treatment: For patient/therapist safety;To address functional/ADL transfers PT goals addressed during session: Mobility/safety with mobility OT goals addressed during session: ADL's and self-care      AM-PAC OT "6 Clicks" Daily Activity     Outcome Measure Help from another person eating meals?: None Help from another person taking care of personal grooming?: A Little Help from another person toileting, which includes using toliet, bedpan, or  urinal?: A Little Help from another person bathing (including washing, rinsing, drying)?: A Lot Help from another person to put on and taking off regular upper body clothing?: A Little Help from another person to put on and taking off regular lower body clothing?: A Lot 6 Click Score: 17   End of Session Equipment Utilized During Treatment: Rolling walker (2 wheels) Nurse Communication: Mobility status  Activity Tolerance: Patient tolerated treatment well Patient left: in chair;with call bell/phone within reach;with chair alarm set;with family/visitor present  OT Visit Diagnosis: Other abnormalities of gait and mobility (R26.89);Muscle weakness (generalized) (M62.81)                Time: 7829-5621 OT Time Calculation (min): 29 min Charges:  OT General Charges $OT Visit: 1 Visit OT Evaluation $OT Eval Moderate Complexity: 1 Mod  Kathie Dike, M.S. OTR/L  05/30/23, 2:03 PM  ascom [phone]

## 2023-05-31 DIAGNOSIS — Z515 Encounter for palliative care: Secondary | ICD-10-CM | POA: Diagnosis not present

## 2023-05-31 DIAGNOSIS — S32591A Other specified fracture of right pubis, initial encounter for closed fracture: Secondary | ICD-10-CM | POA: Diagnosis not present

## 2023-05-31 DIAGNOSIS — N179 Acute kidney failure, unspecified: Secondary | ICD-10-CM | POA: Diagnosis not present

## 2023-05-31 DIAGNOSIS — I214 Non-ST elevation (NSTEMI) myocardial infarction: Secondary | ICD-10-CM | POA: Diagnosis not present

## 2023-05-31 LAB — CBC WITH DIFFERENTIAL/PLATELET
Abs Immature Granulocytes: 0.04 10*3/uL (ref 0.00–0.07)
Basophils Absolute: 0.1 10*3/uL (ref 0.0–0.1)
Basophils Relative: 1 %
Eosinophils Absolute: 0.5 10*3/uL (ref 0.0–0.5)
Eosinophils Relative: 8 %
HCT: 28.8 % — ABNORMAL LOW (ref 36.0–46.0)
Hemoglobin: 8.5 g/dL — ABNORMAL LOW (ref 12.0–15.0)
Immature Granulocytes: 1 %
Lymphocytes Relative: 11 %
Lymphs Abs: 0.7 10*3/uL (ref 0.7–4.0)
MCH: 23.9 pg — ABNORMAL LOW (ref 26.0–34.0)
MCHC: 29.5 g/dL — ABNORMAL LOW (ref 30.0–36.0)
MCV: 81.1 fL (ref 80.0–100.0)
Monocytes Absolute: 0.5 10*3/uL (ref 0.1–1.0)
Monocytes Relative: 8 %
Neutro Abs: 4.7 10*3/uL (ref 1.7–7.7)
Neutrophils Relative %: 71 %
Platelets: 269 10*3/uL (ref 150–400)
RBC: 3.55 MIL/uL — ABNORMAL LOW (ref 3.87–5.11)
RDW: 17.3 % — ABNORMAL HIGH (ref 11.5–15.5)
WBC: 6.6 10*3/uL (ref 4.0–10.5)
nRBC: 0 % (ref 0.0–0.2)

## 2023-05-31 LAB — BASIC METABOLIC PANEL
Anion gap: 7 (ref 5–15)
BUN: 22 mg/dL (ref 8–23)
CO2: 26 mmol/L (ref 22–32)
Calcium: 8.4 mg/dL — ABNORMAL LOW (ref 8.9–10.3)
Chloride: 105 mmol/L (ref 98–111)
Creatinine, Ser: 1.06 mg/dL — ABNORMAL HIGH (ref 0.44–1.00)
GFR, Estimated: 51 mL/min — ABNORMAL LOW (ref >60)
Glucose, Bld: 139 mg/dL — ABNORMAL HIGH (ref 70–99)
Potassium: 4.2 mmol/L (ref 3.5–5.1)
Sodium: 138 mmol/L (ref 135–145)

## 2023-05-31 MED ORDER — SENNA 8.6 MG PO TABS
2.0000 | ORAL_TABLET | Freq: Every day | ORAL | Status: DC
  Administered 2023-05-31 – 2023-06-04 (×5): 17.2 mg via ORAL
  Filled 2023-05-31 (×6): qty 2

## 2023-05-31 MED ORDER — FERROUS SULFATE 325 (65 FE) MG PO TABS
325.0000 mg | ORAL_TABLET | Freq: Every day | ORAL | Status: DC
  Administered 2023-05-31 – 2023-06-05 (×6): 325 mg via ORAL
  Filled 2023-05-31 (×6): qty 1

## 2023-05-31 NOTE — Progress Notes (Signed)
Daily Progress Note   Patient Name: Kelly Ritter       Date: 05/31/2023 DOB: Jul 27, 1934  Age: 89 y.o. MRN#: [id number] Attending Physician: Tresa Moore, MD Primary Care Physician: Leanord Asal, Nelva Bush, MD Admit Date: 05/29/2023  Reason for Consultation/Follow-up: Establishing goals of care  HPI/Brief Hospital Review: 89 y.o. female  with past medical history of CAD s/p DES mid LAD, proximal to mid LCx and mid RCA, amyloidosis, PVD, CVA, HTN, HLD admitted from home on 05/29/2023 with weakness, dizziness and fall. Reports has experienced generalized weakness and episodes of dizziness for several weeks.   Found to have right inferior pubic ramus fracture-medical management Concern for STEMI-heparin started but discontinued due to worsening anemia s/p 1 unit PRBC also with AKI   10/18 Hemoglobin improved and remains stable Creatinine also improving (1.67) baseline appears to be around 0.8   Palliative medicine was consulted for assisting with goals of care conversations.  Subjective: Extensive chart review has been completed prior to meeting patient including labs, vital signs, imaging, progress notes, orders, and available advanced directive documents from current and previous encounters.    Visited with [name] at her bedside, she is awake, alert and able to engage in conversation.  She was able to work with physical therapy this morning took a few steps and sat for a while in her recliner.  She reports a restful evening and that pain seems to be well-controlled with current regimen.  We discussed bowel regimen and [name] shares she struggles with constipation at home prior to admission and we discussed the importance of avoiding constipation and the high risk with utilizing  opioid medications.  MiraLAX on board as needed we will add 2 senna p.o. at bedtime.  No family at bedside during time of visit.  PMT to remain available for ongoing needs and support.  Thank you for allowing the Palliative Medicine Team to assist in the care of this patient.  Total time:  25 minutes  Time spent includes: Detailed review of medical records (labs, imaging, vital signs), medically appropriate exam (mental status, respiratory, cardiac, skin), discussed with treatment team, counseling and educating patient, family and staff, documenting clinical information, medication management and coordination of care.  Leeanne Deed, DNP, AGNP-C Palliative Medicine   Please contact Palliative Medicine Team phone at  [phone] for questions and concerns.

## 2023-05-31 NOTE — Progress Notes (Signed)
PROGRESS NOTE    CAMERA HAKER  UUV:[id number] DOB: [birth year] DOA: 05/29/2023 PCP: Norval Morton, MD    Brief Narrative:   89 year old Caucasian female with history sniffing for osteoarthritis, anxiety, CAD, GERD, hypertension, hyperlipidemia, CVA who presents to the ED with acute onset of generalized weakness and dizziness with subsequent fall.  Per patient she has been feeling increasingly weak however did not report any deterioration in her oral intake or fluid intake.   She presented after mechanical fall.  At this time unable to exclude syncopal event.  Was associated with generalized weakness and dizziness.  Unfortunately fall resulted in right inferior pubic ramus fracture.   Patient also had elevation of troponin concerning for NSTEMI.  Initially started on heparin gtt.  Cardiology consulted.  Stopped heparin as no plans for ischemic evaluation and patient receiving blood transfusion.  Patient has acute on chronic anemia and acute kidney injury.  Etiologies are somewhat unclear.  Will hydrate and transfuse and monitor as appropriate.  10/18: Creatinine improving.  Hb stable 10/19: Creatinine essentially normalized.  Hemoglobin remained stable.   Assessment & Plan:   Principal Problem:   NSTEMI (non-ST elevated myocardial infarction) (HCC) Active Problems:   AKI (acute kidney injury) (HCC)   Fall at home, initial encounter   Dyslipidemia   Elevated transaminase level   Dementia with behavioral disturbance (HCC)   GERD without esophagitis   Hypothyroidism   Closed fracture of right inferior pubic ramus (HCC)  Elevated troponin NSTEMI ruled out.  Troponins peaked at 300.  Subsequently downtrending.  Suspect supply/demand ischemia.  Seen by cardiology.  Heparin gtt. stopped.  No indication for ischemic evaluation. Plan: EKG as needed chest pain  AKI Suspect prerenal azotemia in the setting of poor p.o. intake and poor hydration.  Improved with IV fluids.   Creatinine near normal as of 10/19 Plan: No indication for IV fluids  Acute on chronic iron deficiency anemia No signs of acute blood loss.  Patient's hemoglobin 6.9.  Microcytosis.  Iron indices indicative of iron deficiency anemia. Plan: Hemoglobin stable.  Received 300 mg IV Venofer 10/18. Start p.o. iron supplementation today  Mechanical fall Generalized weakness Right inferior pubic ramus fracture As needed pain control PT and OT consults, recommend SNF Medically stable for discharge to skilled nursing facility  Hyperlipidemia Statin  Hypothyroidism Synthroid  GERD PPI  Dementia with behavioral disturbance Aricept   DVT prophylaxis: SQ Lovenox Code Status: DNR Family Communication: Daughter at bedside 10/18 Disposition Plan: Status is: Inpatient Remains inpatient appropriate because: Unsafe discharge plan.  Medically ready for discharge to skilled nursing facility   Level of care: Telemetry Cardiac  Consultants:  None  Procedures:  None  Antimicrobials: None   Subjective: Patient seen and examined.  Resting comfortably.  No visible distress.  In good spirits.  Objective: Vitals:   05/31/23 0711 05/31/23 0759 05/31/23 0810 05/31/23 1253  BP: (!) 150/77 (!) 149/63  (!) 154/61  Pulse: 96 89 96 91  Resp: 18 16 18 18   Temp:  98 F (36.7 C)  97.7 F (36.5 C)  TempSrc:      SpO2: 93% 92% 91% 99%  Weight:      Height:        Intake/Output Summary (Last 24 hours) at 05/31/2023 1309 Last data filed at 05/31/2023 0809 Gross per 24 hour  Intake --  Output 1020 ml  Net -1020 ml   Filed Weights   05/29/23 0114  Weight: 82.8 kg    Examination:  General exam: In NAD Respiratory system: Lungs clear.  Normal work of breathing.  Room air Cardiovascular system: S1-S2, RRR, no murmurs, no pedal edema Gastrointestinal system: Soft, NT/ND, normal bowel sounds Central nervous system: Alert and oriented. No focal neurological deficits. Extremities:  Symmetric 5 x 5 power.  Right hip pain Skin: No rashes, lesions or ulcers Psychiatry: Judgement and insight appear normal. Mood & affect appropriate.     Data Reviewed: I have personally reviewed following labs and imaging studies  CBC: Recent Labs  Lab 05/29/23 0115 05/29/23 0449 05/29/23 1141 05/29/23 1612 05/30/23 0829 05/31/23 0842  WBC 7.7 8.3  --   --  5.8 6.6  NEUTROABS 5.7  --   --   --  4.0 4.7  HGB 7.3* 6.9* 8.2* 8.3* 8.8* 8.5*  HCT 25.7* 24.2* 27.6* 26.9* 29.4* 28.8*  MCV 79.3* 78.8*  --   --  80.8 81.1  PLT 315 287  --   --  251 269   Basic Metabolic Panel: Recent Labs  Lab 05/29/23 0115 05/29/23 0449 05/30/23 0829 05/31/23 0842  NA 135 136 136 138  K 4.3 4.5 4.5 4.2  CL 101 103 105 105  CO2 21* 23 23 26   GLUCOSE 157* 101* 138* 139*  BUN 38* 37* 33* 22  CREATININE 2.31* 2.08* 1.67* 1.06*  CALCIUM 8.0* 7.9* 7.9* 8.4*  MG 2.0  --   --   --    GFR: Estimated Creatinine Clearance: 39.4 mL/min (A) (by C-G formula based on SCr of 1.06 mg/dL (H)). Liver Function Tests: Recent Labs  Lab 05/29/23 0115  AST 186*  ALT 88*  ALKPHOS 107  BILITOT 0.7  PROT 7.1  ALBUMIN 3.3*   No results for input(s): "LIPASE", "AMYLASE" in the last 168 hours. No results for input(s): "AMMONIA" in the last 168 hours. Coagulation Profile: Recent Labs  Lab 05/29/23 0115  INR 1.4*   Cardiac Enzymes: No results for input(s): "CKTOTAL", "CKMB", "CKMBINDEX", "TROPONINI" in the last 168 hours. BNP (last 3 results) No results for input(s): "PROBNP" in the last 8760 hours. HbA1C: No results for input(s): "HGBA1C" in the last 72 hours. CBG: No results for input(s): "GLUCAP" in the last 168 hours. Lipid Profile: Recent Labs    05/29/23 0449  CHOL 88  HDL 51  LDLCALC 29  TRIG 41  CHOLHDL 1.7   Thyroid Function Tests: Recent Labs    05/29/23 0115  TSH 8.783*  FREET4 1.26*   Anemia Panel: Recent Labs    05/29/23 1612  VITAMINB12 457  FOLATE 9.8  FERRITIN 13   TIBC 433  IRON 36   Sepsis Labs: No results for input(s): "PROCALCITON", "LATICACIDVEN" in the last 168 hours.  Recent Results (from the past 240 hour(s))  SARS Coronavirus 2 by RT PCR (hospital order, performed in [facility] hospital lab) *cepheid single result test* Anterior Nasal Swab     Status: None   Collection Time: 05/29/23  1:46 AM   Specimen: Anterior Nasal Swab  Result Value Ref Range Status   SARS Coronavirus 2 by RT PCR NEGATIVE NEGATIVE Final    Comment: (NOTE) SARS-CoV-2 target nucleic acids are NOT DETECTED.  The SARS-CoV-2 RNA is generally detectable in upper and lower respiratory specimens during the acute phase of infection. The lowest concentration of SARS-CoV-2 viral copies this assay can detect is 250 copies / mL. A negative result does not preclude SARS-CoV-2 infection and should not be used as the sole basis for treatment or other patient management decisions.  A negative result may occur with improper specimen collection / handling, submission of specimen other than nasopharyngeal swab, presence of viral mutation(s) within the areas targeted by this assay, and inadequate number of viral copies (<250 copies / mL). A negative result must be combined with clinical observations, patient history, and epidemiological information.  Fact Sheet for Patients:   RoadLapTop.co.za  Fact Sheet for Healthcare Providers: http://kim-miller.com/  This test is not yet approved or  cleared by the [place] FDA and has been authorized for detection and/or diagnosis of SARS-CoV-2 by FDA under an Emergency Use Authorization (EUA).  This EUA will remain in effect (meaning this test can be used) for the duration of the COVID-19 declaration under Section 564(b)(1) of the Act, 21 U.S.C. section 360bbb-3(b)(1), unless the authorization is terminated or revoked sooner.  Performed at [facility], [address],  [place], [place] [address]          Radiology Studies: No results found.      Scheduled Meds:  sodium chloride   Intravenous Once   aspirin  81 mg Oral Daily   atorvastatin  80 mg Oral QHS   cholecalciferol  1,000 Units Oral Daily   donepezil  10 mg Oral QHS   DULoxetine  60 mg Oral Daily   feeding supplement  237 mL Oral BID BM   ferrous sulfate  325 mg Oral Q breakfast   gabapentin  100 mg Oral TID   isosorbide mononitrate  30 mg Oral Daily   levothyroxine  25 mcg Oral Q0600   multivitamin with minerals  1 tablet Oral Daily   pantoprazole  40 mg Oral QHS   rOPINIRole  0.5 mg Oral QHS   senna  2 tablet Oral QHS   traZODone  50 mg Oral QHS   Continuous Infusions:   LOS: 2 days    Tresa Moore, MD [facility]   If 7PM-7AM, please contact night-coverage  05/31/2023, 1:09 PM

## 2023-05-31 NOTE — NC FL2 (Signed)
[place] MEDICAID FL2 LEVEL OF CARE FORM     IDENTIFICATION  Patient Name: Kelly Ritter Birthdate: [birth year] Sex: female Admission Date (Current Location): 05/29/2023  [facility] and [place] Number:  Chiropodist and Address:  [facility], [address], [place], [place] [address]      Provider Number: [id number]  Attending Physician Name and Address:  Tresa Moore, MD  Relative Name and Phone Number:  Alvester Morin (Daughter)  [phone] [facility]) (HCPOA)    Current Level of Care: Hospital Recommended Level of Care: Skilled Nursing Facility Prior Approval Number: PASRR PENDING  Date Approved/Denied:   PASRR Number:    Discharge Plan: SNF    Current Diagnoses: Patient Active Problem List   Diagnosis Date Noted   NSTEMI (non-ST elevated myocardial infarction) (HCC) 05/29/2023   AKI (acute kidney injury) (HCC) 05/29/2023   Dyslipidemia 05/29/2023   Dementia with behavioral disturbance (HCC) 05/29/2023   GERD without esophagitis 05/29/2023   Hypothyroidism 05/29/2023   Elevated transaminase level 05/29/2023   Closed fracture of right inferior pubic ramus (HCC) 05/29/2023   Fall at home, initial encounter 05/29/2023   Chest pain 04/24/2023   Anemia 04/23/2023   Hyperkalemia 12/23/2020   Cauda equina compression (HCC) 12/21/2020   Spinal stenosis of lumbar region with neurogenic claudication 06/22/2020   Status post total right knee replacement 03/19/2020   Restless legs syndrome 01/19/2019   Mood disorder (HCC) 01/19/2019   Peripheral polyneuropathy 11/17/2018   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   Bilateral carpal tunnel syndrome 08/18/2018   Mixed hyperlipidemia 07/26/2018   GERD (gastroesophageal reflux disease) 07/26/2018   Osteoporosis of forearm 04/02/2018   B12 deficiency 08/14/2017   Prediabetes 08/13/2017   Athscl heart disease of native cor art w oth ang pctrs (HCC) 05/06/2017    SOBOE (shortness of breath on exertion) 02/26/2017   Anxiety, generalized 02/03/2017   Post-concussion headache 02/03/2017   Elevated TSH 10/22/2016   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HCC) 10/22/2016   Primary osteoarthritis of hand 01/09/2016   Arthralgia of multiple sites 12/18/2015   RSD (reflex sympathetic dystrophy) 12/18/2015   Unstable angina (HCC) 09/24/2015   Personal history of other malignant neoplasm of skin 08/02/2015   Benign essential hypertension 01/06/2015   Cerebral infarction (HCC) 12/12/2014   Bilateral carotid artery stenosis 10/21/2014   Other allergic rhinitis 10/21/2014   Paroxysmal supraventricular tachycardia (HCC) 07/05/2014    Orientation RESPIRATION BLADDER Height & Weight     Self, Time, Situation, Place  Normal External catheter Weight: 82.8 kg Height:  5' 4.5" (163.8 cm)  BEHAVIORAL SYMPTOMS/MOOD NEUROLOGICAL BOWEL NUTRITION STATUS      Continent Diet (Dysphagia 3 ;Thin liquid  Liquids provided via: Cup;No straw  Medication Administration: Crushed with puree (w/ Whole in puree if needed)  Supervision: Patient able to self feed;Inter. supervision to cue for compensatory strategies (setup)  See SLP note.)  AMBULATORY STATUS COMMUNICATION OF NEEDS Skin   Limited Assist Verbally Skin abrasions                       Personal Care Assistance Level of Assistance  Bathing, Feeding, Dressing   Feeding assistance: Maximum assistance Dressing Assistance: Limited assistance     Functional Limitations Info             SPECIAL CARE FACTORS FREQUENCY                       Contractures Contractures Info: Not present  Additional Factors Info  Code Status, Allergies Code Status Info: DNR -Limited: See chart for specifics. Allergies Info: Sulfa Antibiotics, Baclofen, Latex as of 05/31/23.           Current Medications (05/31/2023):  This is the current hospital active medication list Current Facility-Administered Medications   Medication Dose Route Frequency Provider Last Rate Last Admin   0.9 %  sodium chloride infusion (Manually program via Guardrails IV Fluids)   Intravenous Once Mansy, Vernetta Honey, MD   Held at 05/29/23 0845   acetaminophen (TYLENOL) tablet 650 mg  650 mg Oral Q4H PRN Mansy, Jan A, MD   650 mg at 05/31/23 0806   ALPRAZolam Prudy Feeler) tablet 0.25 mg  0.25 mg Oral BID PRN Mansy, Jan A, MD       aspirin chewable tablet 81 mg  81 mg Oral Daily Foye Deer, RPH   81 mg at 05/31/23 0807   atorvastatin (LIPITOR) tablet 80 mg  80 mg Oral QHS Mansy, Jan A, MD   80 mg at 05/30/23 2125   cholecalciferol (VITAMIN D3) 25 MCG (1000 UNIT) tablet 1,000 Units  1,000 Units Oral Daily Mansy, Jan A, MD   1,000 Units at 05/31/23 0943   donepezil (ARICEPT) tablet 10 mg  10 mg Oral QHS Mansy, Jan A, MD   10 mg at 05/30/23 2125   DULoxetine (CYMBALTA) DR capsule 60 mg  60 mg Oral Daily Mansy, Jan A, MD   60 mg at 05/31/23 0943   feeding supplement (ENSURE ENLIVE / ENSURE PLUS) liquid 237 mL  237 mL Oral BID BM Sreenath, Sudheer B, MD   237 mL at 05/31/23 0943   ferrous sulfate tablet 325 mg  325 mg Oral Q breakfast Lolita Patella B, MD   325 mg at 05/31/23 0942   gabapentin (NEURONTIN) capsule 100 mg  100 mg Oral TID Mansy, Jan A, MD   100 mg at 05/31/23 0806   isosorbide mononitrate (IMDUR) 24 hr tablet 30 mg  30 mg Oral Daily Mansy, Jan A, MD   30 mg at 05/31/23 8295   levothyroxine (SYNTHROID) tablet 25 mcg  25 mcg Oral Q0600 Mansy, Jan A, MD   25 mcg at 05/31/23 6213   morphine (PF) 2 MG/ML injection 2 mg  2 mg Intravenous Q4H PRN Mansy, Jan A, MD   2 mg at 05/30/23 1002   multivitamin with minerals tablet 1 tablet  1 tablet Oral Daily Lolita Patella B, MD   1 tablet at 05/31/23 0943   nitroGLYCERIN (NITROSTAT) SL tablet 0.4 mg  0.4 mg Sublingual Q5 Min x 3 PRN Mansy, Jan A, MD       ondansetron [facility]) injection 4 mg  4 mg Intravenous Q6H PRN Mansy, Jan A, MD   4 mg at 05/29/23 0865   oxyCODONE (Oxy IR/ROXICODONE)  immediate release tablet 5 mg  5 mg Oral Q4H PRN Theotis Burrow, NP   5 mg at 05/31/23 0810   pantoprazole (PROTONIX) EC tablet 40 mg  40 mg Oral QHS Mansy, Jan A, MD   40 mg at 05/30/23 2125   polyethylene glycol (MIRALAX / GLYCOLAX) packet 17 g  17 g Oral Daily PRN Lolita Patella B, MD   17 g at 05/31/23 0807   rOPINIRole (REQUIP) tablet 0.5 mg  0.5 mg Oral QHS Mansy, Jan A, MD   0.5 mg at 05/30/23 2125   senna (SENOKOT) tablet 17.2 mg  2 tablet Oral QHS Theotis Burrow, NP  traZODone (DESYREL) tablet 50 mg  50 mg Oral QHS Mansy, Jan A, MD   50 mg at 05/30/23 2125     Discharge Medications: Please see discharge summary for a list of discharge medications.  Relevant Imaging Results:  Relevant Lab Results:   Additional Information SS# [id number]. HCPOA: Daughter Alvester Morin as of 05/30/2023. [phone] (Mobile)  Bing Quarry, RN

## 2023-05-31 NOTE — Progress Notes (Signed)
Physical Therapy Treatment Patient Details Name: Kelly Ritter MRN: [id number] DOB: 01/16/1934 Today's Date: 05/31/2023   History of Present Illness 89 year old Caucasian female with history sniffing for osteoarthritis, anxiety, CAD, GERD, hypertension, hyperlipidemia, CVA who presents to the ED with acute onset of generalized weakness and dizziness with subsequent fall. Patient also had elevation of troponin, suspect supply/demand ischemia. 05/28/24 Xray Pelvis: Minimally displaced fracture of the right inferior pubic ramus.  Probable nondisplaced fracture of the right superior pubic ramus adjacent to the pubic symphysis.    PT Comments  Pt in bed, pre-medicated from session.  She is able to transition to sitting with light min a x 1.  She stands with mod a x 1 and is able to hesitantly steps to recliner at bedside.  Pain limits mobility and gait progression but overall happy to be up and moving.  Seated AROM x 10.     If plan is discharge home, recommend the following: A little help with walking and/or transfers;A little help with bathing/dressing/bathroom;Assist for transportation;Help with stairs or ramp for entrance;Assistance with cooking/housework   Can travel by private vehicle     Yes  Equipment Recommendations       Recommendations for Other Services       Precautions / Restrictions Precautions Precautions: Fall Restrictions Weight Bearing Restrictions: Yes RLE Weight Bearing: Weight bearing as tolerated     Mobility  Bed Mobility Overal bed mobility: Needs Assistance Bed Mobility: Supine to Sit     Supine to sit: Min assist       Patient Response: Cooperative  Transfers Overall transfer level: Needs assistance Equipment used: Rolling walker (2 wheels) Transfers: Sit to/from Stand, Bed to chair/wheelchair/BSC Sit to Stand: Mod assist Stand pivot transfers: Min assist         General transfer comment: slow cautions steps to recliner at bedside     Ambulation/Gait Ambulation/Gait assistance: Min assist Gait Distance (Feet): 3 Feet Assistive device: Rolling walker (2 wheels) Gait Pattern/deviations: Step-to pattern Gait velocity: dec     General Gait Details: no dizziness today   Stairs             Wheelchair Mobility     Tilt Bed Tilt Bed Patient Response: Cooperative  Modified Rankin (Stroke Patients Only)       Balance Overall balance assessment: Needs assistance Sitting-balance support: No upper extremity supported, Feet supported Sitting balance-Leahy Scale: Fair     Standing balance support: Bilateral upper extremity supported Standing balance-Leahy Scale: Fair                              Cognition Arousal: Alert Behavior During Therapy: WFL for tasks assessed/performed Overall Cognitive Status: History of cognitive impairments - at baseline                                 General Comments: "I don't know why I am here"        Exercises      General Comments        Pertinent Vitals/Pain Pain Assessment Pain Assessment: Faces Faces Pain Scale: Hurts even more Pain Location: R hip/groin Pain Descriptors / Indicators: Grimacing Pain Intervention(s): Limited activity within patient's tolerance, Monitored during session, Premedicated before session, Repositioned    Home Living  Prior Function            PT Goals (current goals can now be found in the care plan section) Progress towards PT goals: Progressing toward goals    Frequency    Min 1X/week      PT Plan      Co-evaluation              AM-PAC PT "6 Clicks" Mobility   Outcome Measure  Help needed turning from your back to your side while in a flat bed without using bedrails?: A Little Help needed moving from lying on your back to sitting on the side of a flat bed without using bedrails?: A Little Help needed moving to and from a bed to a chair  (including a wheelchair)?: A Little Help needed standing up from a chair using your arms (e.g., wheelchair or bedside chair)?: A Lot Help needed to walk in hospital room?: A Lot Help needed climbing 3-5 steps with a railing? : A Lot 6 Click Score: 15    End of Session Equipment Utilized During Treatment: Gait belt Activity Tolerance: Patient tolerated treatment well;Patient limited by pain Patient left: in chair;with call bell/phone within reach;with chair alarm set Nurse Communication: Mobility status PT Visit Diagnosis: Muscle weakness (generalized) (M62.81);Difficulty in walking, not elsewhere classified (R26.2)     Time: 1610-9604 PT Time Calculation (min) (ACUTE ONLY): 13 min  Charges:    $Therapeutic Activity: 8-22 mins PT General Charges $$ ACUTE PT VISIT: 1 Visit                   Danielle Dess, PTA 05/31/23, 11:04 AM

## 2023-05-31 NOTE — TOC Progression Note (Addendum)
Transition of Care [facility]) - Progression Note    Patient Details  Name: Kelly Ritter MRN: [id number] Date of Birth: 26-Sep-1933  Transition of [facility]) CM/SW Contact  Bing Quarry, RN Phone Number: 05/31/2023, 11:50 AM  Clinical Narrative: 05/31/23: Sherron Monday with HCPOA daughter, Alvester Morin, at [phone], regarding STR recommendations by PT.  Daughter is FNP, but not familiar with process. Explained RN CM role, STR Medicare choices website, and insurance authorization in seeking a STR bed offer. Ms. Lyman Bishop went to Medicare.gov website while speaking to her. Location close to [place] is important to her for family assistance/visitation with patient while in a facility. Chose 3 preferences from Medicare.gov STR/SNF choice list.  [place], [employer], Special educational needs teacher at [employer]. Does not want [facility] or [facility]. Explained that in addition to preferences other facilities would be sent referrals in case preferences not available and that offers would be present to her when available. Ms. Lyman Bishop verbalized understanding of process. She also expressed patient's desire to visit with pet [place] if any way possible while in hospital setting. Will pass this on the Unit RN.   CSW/RN CM referral will be sent out via electronic hub today. TOC to follow up with HCPOA daughter through final dispositions.   New PASRR screen requested further documentation. Uploaded via [employer] site per request.  Documentation uploaded to [facility] Must Patient ID #[id number]. FL2 signed.    Gabriel Cirri MSN RN CM  Transitions of Care Department [facility] [phone] Weekends Only     Expected Discharge Plan: Home w Home Health Services Barriers to Discharge: Continued Medical Work up  Expected Discharge Plan and Services       Living arrangements for the past 2 months: Single Family Home                                       Social Determinants of Health (SDOH) Interventions SDOH  Screenings   Food Insecurity: No Food Insecurity (05/29/2023)  Housing: Low Risk  (05/29/2023)  Transportation Needs: No Transportation Needs (05/29/2023)  Utilities: Not At Risk (05/29/2023)  Alcohol Screen: Low Risk  (03/22/2020)  Depression (PHQ2-9): Medium Risk (03/22/2020)  Financial Resource Strain: Low Risk  (02/26/2022)   Received from [facility], [facility]  Physical Activity: Inactive (03/22/2020)  Social Connections: Unknown (03/22/2020)  Stress: No Stress Concern Present (03/22/2020)  Tobacco Use: Medium Risk (05/29/2023)    Readmission Risk Interventions    05/29/2023   11:19 AM  Readmission Risk Prevention Plan  Transportation Screening Complete  PCP or Specialist Appt within 3-5 Days Complete  HRI or Home Care Consult Complete  Social Work Consult for Recovery Care Planning/Counseling Complete  Palliative Care Screening Not Applicable  Medication Review Oceanographer) Not Complete  Med Review Comments Patient will discuss with nurse and MD upon discharge

## 2023-06-01 DIAGNOSIS — N179 Acute kidney failure, unspecified: Secondary | ICD-10-CM | POA: Diagnosis not present

## 2023-06-01 DIAGNOSIS — Z515 Encounter for palliative care: Secondary | ICD-10-CM | POA: Diagnosis not present

## 2023-06-01 DIAGNOSIS — S32591A Other specified fracture of right pubis, initial encounter for closed fracture: Secondary | ICD-10-CM | POA: Diagnosis not present

## 2023-06-01 DIAGNOSIS — I214 Non-ST elevation (NSTEMI) myocardial infarction: Secondary | ICD-10-CM | POA: Diagnosis not present

## 2023-06-01 LAB — LIPOPROTEIN A (LPA): Lipoprotein (a): 14.8 nmol/L (ref <75.0)

## 2023-06-01 NOTE — Progress Notes (Signed)
PROGRESS NOTE    Kelly Ritter  NWG:[id number] DOB: Dec 30, 1933 DOA: 05/29/2023 PCP: Norval Morton, MD    Brief Narrative:   89 year old Caucasian female with history sniffing for osteoarthritis, anxiety, CAD, GERD, hypertension, hyperlipidemia, CVA who presents to the ED with acute onset of generalized weakness and dizziness with subsequent fall.  Per patient she has been feeling increasingly weak however did not report any deterioration in her oral intake or fluid intake.   She presented after mechanical fall.  At this time unable to exclude syncopal event.  Was associated with generalized weakness and dizziness.  Unfortunately fall resulted in right inferior pubic ramus fracture.   Patient also had elevation of troponin concerning for NSTEMI.  Initially started on heparin gtt.  Cardiology consulted.  Stopped heparin as no plans for ischemic evaluation and patient receiving blood transfusion.  Patient has acute on chronic anemia and acute kidney injury.  Etiologies are somewhat unclear.  Will hydrate and transfuse and monitor as appropriate.  10/18: Creatinine improving.  Hb stable 10/19: Creatinine essentially normalized.  Hemoglobin remained stable. 10/20: Medically stable for discharge to skilled nursing facility.  Pending placement.   Assessment & Plan:   Principal Problem:   NSTEMI (non-ST elevated myocardial infarction) (HCC) Active Problems:   AKI (acute kidney injury) (HCC)   Fall at home, initial encounter   Dyslipidemia   Elevated transaminase level   Dementia with behavioral disturbance (HCC)   GERD without esophagitis   Hypothyroidism   Closed fracture of right inferior pubic ramus (HCC)  Elevated troponin NSTEMI ruled out.  Troponins peaked at 300.  Subsequently downtrending.  Suspect supply/demand ischemia.  Seen by cardiology.  Heparin gtt. stopped.  No indication for ischemic evaluation. Plan: EKG as needed chest pain  AKI Suspect prerenal azotemia  in the setting of poor p.o. intake and poor hydration.  Improved with IV fluids.  Creatinine near normal as of 10/19 Plan: Ensure p.o. fluid intake.  Monitor creatinine periodically  Acute on chronic iron deficiency anemia No signs of acute blood loss.  Patient's hemoglobin 6.9.  Microcytosis.  Iron indices indicative of iron deficiency anemia. Plan: Hemoglobin stable.  Received 300 mg IV Venofer 10/18. Continue p.o. iron supplementation  Mechanical fall Generalized weakness Right inferior pubic ramus fracture As needed pain control PT and OT consults, recommend SNF Medically stable for discharge to skilled nursing facility Pending placement  Hyperlipidemia Statin  Hypothyroidism Synthroid  GERD PPI  Dementia with behavioral disturbance Aricept   DVT prophylaxis: SQ Lovenox Code Status: DNR Family Communication: Daughter at bedside 10/18 Disposition Plan: Status is: Inpatient Remains inpatient appropriate because: Unsafe discharge plan.  Medically ready for discharge to skilled nursing facility   Level of care: Telemetry Cardiac  Consultants:  None  Procedures:  None  Antimicrobials: None   Subjective: Patient seen and examined.  Resting comfortably in bed.  In good spirits.  Aware of the plan for skilled nursing facility.  Objective: Vitals:   06/01/23 0038 06/01/23 0321 06/01/23 0733 06/01/23 1143  BP: (!) 161/49 (!) 159/48 (!) 163/62 (!) 162/57  Pulse: (!) 57 71 85 86  Resp: 18 18 16 18   Temp: 98.6 F (37 C) 98.3 F (36.8 C) 98.4 F (36.9 C) 98.6 F (37 C)  TempSrc: Oral Oral Oral Oral  SpO2: 96% 95% 95% 94%  Weight:      Height:        Intake/Output Summary (Last 24 hours) at 06/01/2023 1438 Last data filed at 06/01/2023 0700  Gross per 24 hour  Intake 240 ml  Output 1200 ml  Net -960 ml   Filed Weights   05/29/23 0114  Weight: 82.8 kg    Examination:  General exam: No acute distress Respiratory system: Lungs clear.  Normal work  of breathing.  Room air Cardiovascular system: S1-S2, RRR, no murmurs, no pedal edema Gastrointestinal system: Soft, NT/ND, normal bowel sounds Central nervous system: Alert and oriented. No focal neurological deficits. Extremities: Symmetric 5 x 5 power.  Decreased range of motion right hip Skin: No rashes, lesions or ulcers Psychiatry: Judgement and insight appear normal. Mood & affect appropriate.     Data Reviewed: I have personally reviewed following labs and imaging studies  CBC: Recent Labs  Lab 05/29/23 0115 05/29/23 0449 05/29/23 1141 05/29/23 1612 05/30/23 0829 05/31/23 0842  WBC 7.7 8.3  --   --  5.8 6.6  NEUTROABS 5.7  --   --   --  4.0 4.7  HGB 7.3* 6.9* 8.2* 8.3* 8.8* 8.5*  HCT 25.7* 24.2* 27.6* 26.9* 29.4* 28.8*  MCV 79.3* 78.8*  --   --  80.8 81.1  PLT 315 287  --   --  251 269   Basic Metabolic Panel: Recent Labs  Lab 05/29/23 0115 05/29/23 0449 05/30/23 0829 05/31/23 0842  NA 135 136 136 138  K 4.3 4.5 4.5 4.2  CL 101 103 105 105  CO2 21* 23 23 26   GLUCOSE 157* 101* 138* 139*  BUN 38* 37* 33* 22  CREATININE 2.31* 2.08* 1.67* 1.06*  CALCIUM 8.0* 7.9* 7.9* 8.4*  MG 2.0  --   --   --    GFR: Estimated Creatinine Clearance: 39.4 mL/min (A) (by C-G formula based on SCr of 1.06 mg/dL (H)). Liver Function Tests: Recent Labs  Lab 05/29/23 0115  AST 186*  ALT 88*  ALKPHOS 107  BILITOT 0.7  PROT 7.1  ALBUMIN 3.3*   No results for input(s): "LIPASE", "AMYLASE" in the last 168 hours. No results for input(s): "AMMONIA" in the last 168 hours. Coagulation Profile: Recent Labs  Lab 05/29/23 0115  INR 1.4*   Cardiac Enzymes: No results for input(s): "CKTOTAL", "CKMB", "CKMBINDEX", "TROPONINI" in the last 168 hours. BNP (last 3 results) No results for input(s): "PROBNP" in the last 8760 hours. HbA1C: No results for input(s): "HGBA1C" in the last 72 hours. CBG: No results for input(s): "GLUCAP" in the last 168 hours. Lipid Profile: No results  for input(s): "CHOL", "HDL", "LDLCALC", "TRIG", "CHOLHDL", "LDLDIRECT" in the last 72 hours.  Thyroid Function Tests: No results for input(s): "TSH", "T4TOTAL", "FREET4", "T3FREE", "THYROIDAB" in the last 72 hours.  Anemia Panel: Recent Labs    05/29/23 1612  VITAMINB12 457  FOLATE 9.8  FERRITIN 13  TIBC 433  IRON 36   Sepsis Labs: No results for input(s): "PROCALCITON", "LATICACIDVEN" in the last 168 hours.  Recent Results (from the past 240 hour(s))  SARS Coronavirus 2 by RT PCR (hospital order, performed in [facility] hospital lab) *cepheid single result test* Anterior Nasal Swab     Status: None   Collection Time: 05/29/23  1:46 AM   Specimen: Anterior Nasal Swab  Result Value Ref Range Status   SARS Coronavirus 2 by RT PCR NEGATIVE NEGATIVE Final    Comment: (NOTE) SARS-CoV-2 target nucleic acids are NOT DETECTED.  The SARS-CoV-2 RNA is generally detectable in upper and lower respiratory specimens during the acute phase of infection. The lowest concentration of SARS-CoV-2 viral copies this assay can detect  is 250 copies / mL. A negative result does not preclude SARS-CoV-2 infection and should not be used as the sole basis for treatment or other patient management decisions.  A negative result may occur with improper specimen collection / handling, submission of specimen other than nasopharyngeal swab, presence of viral mutation(s) within the areas targeted by this assay, and inadequate number of viral copies (<250 copies / mL). A negative result must be combined with clinical observations, patient history, and epidemiological information.  Fact Sheet for Patients:   RoadLapTop.co.za  Fact Sheet for Healthcare Providers: http://kim-miller.com/  This test is not yet approved or  cleared by the [place] FDA and has been authorized for detection and/or diagnosis of SARS-CoV-2 by FDA under an Emergency Use Authorization  (EUA).  This EUA will remain in effect (meaning this test can be used) for the duration of the COVID-19 declaration under Section 564(b)(1) of the Act, 21 U.S.C. section 360bbb-3(b)(1), unless the authorization is terminated or revoked sooner.  Performed at [facility], [address], [place], [place] [address]          Radiology Studies: No results found.      Scheduled Meds:  sodium chloride   Intravenous Once   aspirin  81 mg Oral Daily   atorvastatin  80 mg Oral QHS   cholecalciferol  1,000 Units Oral Daily   donepezil  10 mg Oral QHS   DULoxetine  60 mg Oral Daily   feeding supplement  237 mL Oral BID BM   ferrous sulfate  325 mg Oral Q breakfast   gabapentin  100 mg Oral TID   isosorbide mononitrate  30 mg Oral Daily   levothyroxine  25 mcg Oral Q0600   multivitamin with minerals  1 tablet Oral Daily   pantoprazole  40 mg Oral QHS   rOPINIRole  0.5 mg Oral QHS   senna  2 tablet Oral QHS   traZODone  50 mg Oral QHS   Continuous Infusions:   LOS: 3 days    Tresa Moore, MD [facility]   If 7PM-7AM, please contact night-coverage  06/01/2023, 2:38 PM

## 2023-06-01 NOTE — Progress Notes (Signed)
Daily Progress Note   Patient Name: Kelly Ritter       Date: 06/01/2023 DOB: April 20, 1934  Age: 89 y.o. MRN#: [id number] Attending Physician: Tresa Moore, MD Primary Care Physician: Kelly Ritter, Kelly Bush, MD Admit Date: 05/29/2023  Reason for Consultation/Follow-up: Establishing goals of care  HPI/Brief Hospital Review: 89 y.o. female  with past medical history of CAD s/p DES mid LAD, proximal to mid LCx and mid RCA, amyloidosis, PVD, CVA, HTN, HLD admitted from home on 05/29/2023 with weakness, dizziness and fall. Reports has experienced generalized weakness and episodes of dizziness for several weeks.   Found to have right inferior pubic ramus fracture-medical management Concern for STEMI-heparin started but discontinued due to worsening anemia s/p 1 unit PRBC also with AKI   10/18-10/19 Hemoglobin improved and remains stable Creatinine also improving (1.06) baseline appears to be around 0.8   Palliative medicine was consulted for assisting with goals of care conversations.  Subjective: Extensive chart review has been completed prior to meeting patient including labs, vital signs, imaging, progress notes, orders, and available advanced directive documents from current and previous encounters.    Visited with Kelly Ritter at her bedside, she is awake, alert and sitting up in her recliner.  She shares she had a restful evening and has had a good day so far.  She shares pain remains well-controlled and that she was able to have a bowel movement.  She is eager to be discharged from the hospital looking forward to returning home but agrees to short-term rehab in between is the best option for her.  Answered and addressed all questions and concerns.  Encouraged Kelly Ritter to reach out  to PMT phone if any needs arise.  Thank you for allowing the Palliative Medicine Team to assist in the care of this patient.  Total time:  25 minutes  Time spent includes: Detailed review of medical records (labs, imaging, vital signs), medically appropriate exam (mental status, respiratory, cardiac, skin), discussed with treatment team, counseling and educating patient, family and staff, documenting clinical information, medication management and coordination of care.  Leeanne Deed, DNP, AGNP-C Palliative Medicine   Please contact Palliative Medicine Team phone at [phone] for questions and concerns.

## 2023-06-02 ENCOUNTER — Inpatient Hospital Stay: Payer: Medicare HMO

## 2023-06-02 DIAGNOSIS — I214 Non-ST elevation (NSTEMI) myocardial infarction: Secondary | ICD-10-CM | POA: Diagnosis not present

## 2023-06-02 MED ORDER — FUROSEMIDE 10 MG/ML IJ SOLN
40.0000 mg | Freq: Once | INTRAMUSCULAR | Status: AC
  Administered 2023-06-02: 40 mg via INTRAVENOUS
  Filled 2023-06-02: qty 4

## 2023-06-02 NOTE — Progress Notes (Signed)
Physical Therapy Treatment Patient Details Name: Kelly Ritter MRN: [id number] DOB: 06-16-1934 Today's Date: 06/02/2023   History of Present Illness 89 year old Caucasian female with history sniffing for osteoarthritis, anxiety, CAD, GERD, hypertension, hyperlipidemia, CVA who presents to the ED with acute onset of generalized weakness and dizziness with subsequent fall. Patient also had elevation of troponin, suspect supply/demand ischemia. 05/28/24 Xray Pelvis: Minimally displaced fracture of the right inferior pubic ramus.  Probable nondisplaced fracture of the right superior pubic ramus adjacent to the pubic symphysis.    PT Comments  Patient generally fatigued in appearance this date; mild/mod SOB with minimal exertion (even at rest at times), though sats >96% on RA. Able to complete bed mobility, sit/stand, and be/chair transfer with RW, cga/min assist this date.  improved postural extension, improved stance time/weight acceptance to R LE this date; continues to require use of RW and +1 assist for optimal safety/indep  Additional gait/activity deferred due to arrival of lunch tray.  Will continue to progress as medically appropriate.     If plan is discharge home, recommend the following: A little help with walking and/or transfers;A little help with bathing/dressing/bathroom;Assist for transportation;Help with stairs or ramp for entrance;Assistance with cooking/housework   Can travel by private vehicle     Yes  Equipment Recommendations       Recommendations for Other Services       Precautions / Restrictions Precautions Precautions: Fall Restrictions Weight Bearing Restrictions: Yes RLE Weight Bearing: Weight bearing as tolerated     Mobility  Bed Mobility Overal bed mobility: Needs Assistance Bed Mobility: Supine to Sit     Supine to sit: Contact guard     General bed mobility comments: increased time and use of bed features, but able to complete with very minimal  assis tfrom therapist this date (towards R side of bed)    Transfers Overall transfer level: Needs assistance Equipment used: Rolling walker (2 wheels) Transfers: Sit to/from Stand, Bed to chair/wheelchair/BSC Sit to Stand: Contact guard assist Stand pivot transfers: Contact guard assist         General transfer comment: improved postural extension, improved stance time/weight acceptance to R LE this date; continues to require use of RW and +1 assist for optimal safety/indep    Ambulation/Gait               General Gait Details: deferred due to lunch tray arriving   Stairs             Wheelchair Mobility     Tilt Bed    Modified Rankin (Stroke Patients Only)       Balance Overall balance assessment: Needs assistance Sitting-balance support: No upper extremity supported, Feet supported Sitting balance-Leahy Scale: Good     Standing balance support: Bilateral upper extremity supported Standing balance-Leahy Scale: Fair                              Cognition Arousal: Alert Behavior During Therapy: WFL for tasks assessed/performed Overall Cognitive Status: Within Functional Limits for tasks assessed                                          Exercises Other Exercises Other Exercises: Reinforced role of activity pacing, use of incentive spirometer; patient voiced understanding.   Will continue to integrate as part of HEP in subsequent  sessions (when not eating), as patient noted with incresaed SOB this date (sats >96% on RA)    General Comments        Pertinent Vitals/Pain Pain Assessment Pain Assessment: Faces Faces Pain Scale: Hurts little more Pain Location: R hip/groin Pain Descriptors / Indicators: Grimacing Pain Intervention(s): Limited activity within patient's tolerance, Monitored during session, Premedicated before session, Repositioned    Home Living                          Prior Function             PT Goals (current goals can now be found in the care plan section) Acute Rehab PT Goals Patient Stated Goal: to be able to get back home PT Goal Formulation: With patient/family Time For Goal Achievement: 06/13/23 Potential to Achieve Goals: Good Progress towards PT goals: Progressing toward goals    Frequency    Min 1X/week      PT Plan      Co-evaluation              AM-PAC PT "6 Clicks" Mobility   Outcome Measure  Help needed turning from your back to your side while in a flat bed without using bedrails?: None Help needed moving from lying on your back to sitting on the side of a flat bed without using bedrails?: A Little Help needed moving to and from a bed to a chair (including a wheelchair)?: A Little Help needed standing up from a chair using your arms (e.g., wheelchair or bedside chair)?: A Little Help needed to walk in hospital room?: A Little Help needed climbing 3-5 steps with a railing? : A Lot 6 Click Score: 18    End of Session   Activity Tolerance: Patient tolerated treatment well (limited by SOB) Patient left: in chair;with call bell/phone within reach;with chair alarm set Nurse Communication: Mobility status PT Visit Diagnosis: Muscle weakness (generalized) (M62.81);Difficulty in walking, not elsewhere classified (R26.2)     Time: 8413-2440 PT Time Calculation (min) (ACUTE ONLY): 24 min  Charges:    $Therapeutic Activity: 23-37 mins PT General Charges $$ ACUTE PT VISIT: 1 Visit                    Kasumi Ditullio H. Manson Passey, PT, DPT, NCS 06/02/23, 2:02 PM [phone]

## 2023-06-02 NOTE — Progress Notes (Signed)
Occupational Therapy Treatment Patient Details Name: Kelly Ritter MRN: [id number] DOB: [birth year] Today's Date: 06/02/2023   History of present illness 89 year old Caucasian female with history sniffing for osteoarthritis, anxiety, CAD, GERD, hypertension, hyperlipidemia, CVA who presents to the ED with acute onset of generalized weakness and dizziness with subsequent fall. Patient also had elevation of troponin, suspect supply/demand ischemia. 05/28/24 Xray Pelvis: Minimally displaced fracture of the right inferior pubic ramus.  Probable nondisplaced fracture of the right superior pubic ramus adjacent to the pubic symphysis.   OT comments  Pt seen for OT treatment on this date. Upon arrival to room pt seated in chair, agreeable to tx. Pt reports feeling pain when moving around. Pt completed sit<>stand t/f and ambulated in room ~72ft for chair to entry door and back to chair with CGA +RW. Pt completed functional standing balance for task with good balance reaching inside base of support. Repeated cuing to manage safety with RW. Pt left seated in chair with call bell within reach and chair alarm. Pt making good progress toward goals, will continue to follow POC. Discharge recommendation remains appropriate.        If plan is discharge home, recommend the following:  Supervision due to cognitive status;Assist for transportation;Assistance with cooking/housework;A lot of help with walking and/or transfers;A lot of help with bathing/dressing/bathroom   Equipment Recommendations  BSC/3in1    Recommendations for Other Services      Precautions / Restrictions Precautions Precautions: Fall Restrictions Weight Bearing Restrictions: Yes RLE Weight Bearing: Weight bearing as tolerated       Mobility Bed Mobility                    Transfers Overall transfer level: Needs assistance Equipment used: Rolling walker (2 wheels) Transfers: Sit to/from Stand Sit to Stand: Contact guard  assist                 Balance Overall balance assessment: Needs assistance Sitting-balance support: No upper extremity supported, Feet supported Sitting balance-Leahy Scale: Good     Standing balance support: Bilateral upper extremity supported, Reliant on assistive device for balance, During functional activity Standing balance-Leahy Scale: Good                             ADL either performed or assessed with clinical judgement   ADL Overall ADL's : Needs assistance/impaired                                            Extremity/Trunk Assessment Upper Extremity Assessment Upper Extremity Assessment: Overall WFL for tasks assessed   Lower Extremity Assessment Lower Extremity Assessment: Generalized weakness        Vision Patient Visual Report: No change from baseline     Perception     Praxis      Cognition Arousal: Alert Behavior During Therapy: WFL for tasks assessed/performed Overall Cognitive Status: Within Functional Limits for tasks assessed                                          Exercises      Shoulder Instructions       General Comments      Pertinent Vitals/ Pain  Pain Assessment Pain Assessment: Faces Faces Pain Scale: Hurts little more Pain Location: R hip/groin Pain Descriptors / Indicators: Grimacing Pain Intervention(s): Limited activity within patient's tolerance, Monitored during session, Repositioned  Home Living                                          Prior Functioning/Environment              Frequency  Min 1X/week        Progress Toward Goals  OT Goals(current goals can now be found in the care plan section)  Progress towards OT goals: Progressing toward goals  Acute Rehab OT Goals Patient Stated Goal: to go home. OT Goal Formulation: With patient/family Time For Goal Achievement: 06/13/23 Potential to Achieve Goals: Good  Plan       Co-evaluation                 AM-PAC OT "6 Clicks" Daily Activity     Outcome Measure   Help from another person eating meals?: None Help from another person taking care of personal grooming?: A Little Help from another person toileting, which includes using toliet, bedpan, or urinal?: A Little Help from another person bathing (including washing, rinsing, drying)?: A Lot Help from another person to put on and taking off regular upper body clothing?: A Little Help from another person to put on and taking off regular lower body clothing?: A Lot 6 Click Score: 17    End of Session Equipment Utilized During Treatment: Rolling walker (2 wheels)  OT Visit Diagnosis: Other abnormalities of gait and mobility (R26.89);Muscle weakness (generalized) (M62.81)   Activity Tolerance Patient tolerated treatment well   Patient Left in chair;with call bell/phone within reach;with chair alarm set;with family/visitor present   Nurse Communication          Time:  -     Charges:    Butch Penny, SOT

## 2023-06-02 NOTE — Progress Notes (Signed)
PROGRESS NOTE    Kelly Ritter  TKZ:[id number] DOB: 1934/08/09 DOA: 05/29/2023 PCP: Norval Morton, MD    Brief Narrative:   89 year old Caucasian female with history sniffing for osteoarthritis, anxiety, CAD, GERD, hypertension, hyperlipidemia, CVA who presents to the ED with acute onset of generalized weakness and dizziness with subsequent fall.  Per patient she has been feeling increasingly weak however did not report any deterioration in her oral intake or fluid intake.   She presented after mechanical fall.  At this time unable to exclude syncopal event.  Was associated with generalized weakness and dizziness.  Unfortunately fall resulted in right inferior pubic ramus fracture.   Patient also had elevation of troponin concerning for NSTEMI.  Initially started on heparin gtt.  Cardiology consulted.  Stopped heparin as no plans for ischemic evaluation and patient receiving blood transfusion.  Patient has acute on chronic anemia and acute kidney injury.  Etiologies are somewhat unclear.  Will hydrate and transfuse and monitor as appropriate.  10/18: Creatinine improving.  Hb stable 10/19: Creatinine essentially normalized.  Hemoglobin remained stable. 10/20: Medically stable for discharge to skilled nursing facility.  Pending placement.   Assessment & Plan:   Principal Problem:   NSTEMI (non-ST elevated myocardial infarction) (HCC) Active Problems:   AKI (acute kidney injury) (HCC)   Fall at home, initial encounter   Dyslipidemia   Elevated transaminase level   Dementia with behavioral disturbance (HCC)   GERD without esophagitis   Hypothyroidism   Closed fracture of right inferior pubic ramus (HCC)  Elevated troponin NSTEMI ruled out.  Troponins peaked at 300.  Subsequently downtrending.  Suspect supply/demand ischemia.  Seen by cardiology.  Heparin gtt. stopped.  No indication for ischemic evaluation. Plan: EKG as needed chest pain  AKI Suspect prerenal azotemia  in the setting of poor p.o. intake and poor hydration.  Improved with IV fluids.  Creatinine near normal as of 10/19 Plan: Ensure p.o. fluid intake.  Monitor creatinine periodically Creatinine normalized  Acute on chronic iron deficiency anemia No signs of acute blood loss.  Patient's hemoglobin 6.9.  Microcytosis.  Iron indices indicative of iron deficiency anemia. Plan: Hemoglobin stable.  Received 300 mg IV Venofer 10/18. Continue p.o. iron supplementation.  25 mg Feosol daily  Mechanical fall Generalized weakness Right inferior pubic ramus fracture As needed pain control PT and OT consults, recommend SNF Medically stable for discharge to skilled nursing facility Pending placement  Hyperlipidemia Statin  Hypothyroidism Synthroid  GERD PPI  Dementia with behavioral disturbance Aricept   DVT prophylaxis: SQ Lovenox Code Status: DNR Family Communication: Daughter at bedside 10/18 Disposition Plan: Status is: Inpatient Remains inpatient appropriate because: Unsafe discharge plan.  Medically ready   Level of care: Telemetry Cardiac  Consultants:  None  Procedures:  None  Antimicrobials: None   Subjective: Seen and examined.  Resting in bed.  No distress.  Objective: Vitals:   06/02/23 0317 06/02/23 0752 06/02/23 1239 06/02/23 1457  BP: (!) 149/49 (!) 140/56 (!) 153/46 (!) 137/47  Pulse: (!) 54 (!) 52 (!) 56 (!) 56  Resp: 20 19 18 20   Temp: 97.8 F (36.6 C) 98.5 F (36.9 C) 98.5 F (36.9 C) 98.7 F (37.1 C)  TempSrc: Oral Oral Oral Oral  SpO2: 95% 97% 96% 95%  Weight:      Height:        Intake/Output Summary (Last 24 hours) at 06/02/2023 1527 Last data filed at 06/02/2023 1457 Gross per 24 hour  Intake 360 ml  Output 1050 ml  Net -690 ml   Filed Weights   05/29/23 0114  Weight: 82.8 kg    Examination:  General exam: NAD Respiratory system: Bibasilar crackles.  Normal breathing.  Room air Cardiovascular system: S1-S2, RRR, no  murmurs, no pedal edema Gastrointestinal system: Soft, NT/ND, normal bowel sounds Central nervous system: Alert and oriented. No focal neurological deficits. Extremities: Symmetric 5 x 5 power.  Decreased range of motion right hip Skin: No rashes, lesions or ulcers Psychiatry: Judgement and insight appear normal. Mood & affect appropriate.     Data Reviewed: I have personally reviewed following labs and imaging studies  CBC: Recent Labs  Lab 05/29/23 0115 05/29/23 0449 05/29/23 1141 05/29/23 1612 05/30/23 0829 05/31/23 0842  WBC 7.7 8.3  --   --  5.8 6.6  NEUTROABS 5.7  --   --   --  4.0 4.7  HGB 7.3* 6.9* 8.2* 8.3* 8.8* 8.5*  HCT 25.7* 24.2* 27.6* 26.9* 29.4* 28.8*  MCV 79.3* 78.8*  --   --  80.8 81.1  PLT 315 287  --   --  251 269   Basic Metabolic Panel: Recent Labs  Lab 05/29/23 0115 05/29/23 0449 05/30/23 0829 05/31/23 0842  NA 135 136 136 138  K 4.3 4.5 4.5 4.2  CL 101 103 105 105  CO2 21* 23 23 26   GLUCOSE 157* 101* 138* 139*  BUN 38* 37* 33* 22  CREATININE 2.31* 2.08* 1.67* 1.06*  CALCIUM 8.0* 7.9* 7.9* 8.4*  MG 2.0  --   --   --    GFR: Estimated Creatinine Clearance: 39.4 mL/min (A) (by C-G formula based on SCr of 1.06 mg/dL (H)). Liver Function Tests: Recent Labs  Lab 05/29/23 0115  AST 186*  ALT 88*  ALKPHOS 107  BILITOT 0.7  PROT 7.1  ALBUMIN 3.3*   No results for input(s): "LIPASE", "AMYLASE" in the last 168 hours. No results for input(s): "AMMONIA" in the last 168 hours. Coagulation Profile: Recent Labs  Lab 05/29/23 0115  INR 1.4*   Cardiac Enzymes: No results for input(s): "CKTOTAL", "CKMB", "CKMBINDEX", "TROPONINI" in the last 168 hours. BNP (last 3 results) No results for input(s): "PROBNP" in the last 8760 hours. HbA1C: No results for input(s): "HGBA1C" in the last 72 hours. CBG: No results for input(s): "GLUCAP" in the last 168 hours. Lipid Profile: No results for input(s): "CHOL", "HDL", "LDLCALC", "TRIG", "CHOLHDL",  "LDLDIRECT" in the last 72 hours.  Thyroid Function Tests: No results for input(s): "TSH", "T4TOTAL", "FREET4", "T3FREE", "THYROIDAB" in the last 72 hours.  Anemia Panel: No results for input(s): "VITAMINB12", "FOLATE", "FERRITIN", "TIBC", "IRON", "RETICCTPCT" in the last 72 hours.  Sepsis Labs: No results for input(s): "PROCALCITON", "LATICACIDVEN" in the last 168 hours.  Recent Results (from the past 240 hour(s))  SARS Coronavirus 2 by RT PCR (hospital order, performed in [facility] hospital lab) *cepheid single result test* Anterior Nasal Swab     Status: None   Collection Time: 05/29/23  1:46 AM   Specimen: Anterior Nasal Swab  Result Value Ref Range Status   SARS Coronavirus 2 by RT PCR NEGATIVE NEGATIVE Final    Comment: (NOTE) SARS-CoV-2 target nucleic acids are NOT DETECTED.  The SARS-CoV-2 RNA is generally detectable in upper and lower respiratory specimens during the acute phase of infection. The lowest concentration of SARS-CoV-2 viral copies this assay can detect is 250 copies / mL. A negative result does not preclude SARS-CoV-2 infection and should not be used as the sole  basis for treatment or other patient management decisions.  A negative result may occur with improper specimen collection / handling, submission of specimen other than nasopharyngeal swab, presence of viral mutation(s) within the areas targeted by this assay, and inadequate number of viral copies (<250 copies / mL). A negative result must be combined with clinical observations, patient history, and epidemiological information.  Fact Sheet for Patients:   RoadLapTop.co.za  Fact Sheet for Healthcare Providers: http://kim-miller.com/  This test is not yet approved or  cleared by the [place] FDA and has been authorized for detection and/or diagnosis of SARS-CoV-2 by FDA under an Emergency Use Authorization (EUA).  This EUA will remain in effect  (meaning this test can be used) for the duration of the COVID-19 declaration under Section 564(b)(1) of the Act, 21 U.S.C. section 360bbb-3(b)(1), unless the authorization is terminated or revoked sooner.  Performed at [facility], [address], [place], [place] [address]          Radiology Studies: No results found.      Scheduled Meds:  sodium chloride   Intravenous Once   aspirin  81 mg Oral Daily   atorvastatin  80 mg Oral QHS   cholecalciferol  1,000 Units Oral Daily   donepezil  10 mg Oral QHS   DULoxetine  60 mg Oral Daily   feeding supplement  237 mL Oral BID BM   ferrous sulfate  325 mg Oral Q breakfast   furosemide  40 mg Intravenous Once   gabapentin  100 mg Oral TID   isosorbide mononitrate  30 mg Oral Daily   levothyroxine  25 mcg Oral Q0600   multivitamin with minerals  1 tablet Oral Daily   pantoprazole  40 mg Oral QHS   rOPINIRole  0.5 mg Oral QHS   senna  2 tablet Oral QHS   traZODone  50 mg Oral QHS   Continuous Infusions:   LOS: 4 days    Tresa Moore, MD [facility]   If 7PM-7AM, please contact night-coverage  06/02/2023, 3:27 PM

## 2023-06-02 NOTE — TOC Progression Note (Signed)
Transition of Care [facility]) - Progression Note    Patient Details  Name: Kelly Ritter MRN: [id number] Date of Birth: 06-15-1934  [facility] Care [facility]) CM/SW Contact  Truddie Hidden, RN Phone Number: 06/02/2023, 1:17 PM  Clinical Narrative:    Spoke with patient's duaghter, Kelly Ritter to give bed offer for [facility]. She is agreeable to bed offer. RNCM explained next steps including obtaining insurance auth. Herbert Seta was advised once patient is medically stable and insurance Berkley Harvey has been obtained patient would likely discharge.    Expected Discharge Plan: Home w Home Health Services Barriers to Discharge: Continued Medical Work up  Expected Discharge Plan and Services       Living arrangements for the past 2 months: Single Family Home                                       Social Determinants of Health (SDOH) Interventions SDOH Screenings   Food Insecurity: No Food Insecurity (05/29/2023)  Housing: Low Risk  (05/29/2023)  Transportation Needs: No Transportation Needs (05/29/2023)  Utilities: Not At Risk (05/29/2023)  Alcohol Screen: Low Risk  (03/22/2020)  Depression (PHQ2-9): Medium Risk (03/22/2020)  Financial Resource Strain: Low Risk  (02/26/2022)   [facility] from [facility]  Physical Activity: Inactive (03/22/2020)  Social Connections: Unknown (03/22/2020)  Stress: No Stress Concern Present (03/22/2020)  Tobacco Use: Medium Risk (05/29/2023)    Readmission Risk Interventions    05/29/2023   11:19 AM  Readmission Risk Prevention Plan  Transportation Screening Complete  PCP or Specialist Appt within 3-5 Days Complete  HRI or Home Care Consult Complete  Social Work Consult for Recovery Care Planning/Counseling Complete  Palliative Care Screening Not Applicable  Medication Review Oceanographer) Not Complete  Med Review Comments Patient will discuss with nurse and MD upon discharge

## 2023-06-02 NOTE — Plan of Care (Signed)
  Problem: Education: Goal: Knowledge of General Education information will improve Description Including pain rating scale, medication(s)/side effects and non-pharmacologic comfort measures Outcome: Progressing   

## 2023-06-02 NOTE — Plan of Care (Signed)
  Problem: Education: Goal: Understanding of cardiac disease, CV risk reduction, and recovery process will improve Outcome: Progressing Goal: Individualized Educational Video(s) Outcome: Progressing   Problem: Activity: Goal: Ability to tolerate increased activity will improve Outcome: Progressing   Problem: Cardiac: Goal: Ability to achieve and maintain adequate cardiovascular perfusion will improve Outcome: Progressing   Problem: Health Behavior/Discharge Planning: Goal: Ability to safely manage health-related needs after discharge will improve Outcome: Progressing   Problem: Health Behavior/Discharge Planning: Goal: Ability to manage health-related needs will improve Outcome: Progressing   Problem: Education: Goal: Knowledge of General Education information will improve Description: Including pain rating scale, medication(s)/side effects and non-pharmacologic comfort measures Outcome: Progressing   Problem: Clinical Measurements: Goal: Ability to maintain clinical measurements within normal limits will improve Outcome: Progressing Goal: Will remain free from infection Outcome: Progressing Goal: Diagnostic test results will improve Outcome: Progressing Goal: Respiratory complications will improve Outcome: Progressing Goal: Cardiovascular complication will be avoided Outcome: Progressing   Problem: Clinical Measurements: Goal: Will remain free from infection Outcome: Progressing   Problem: Coping: Goal: Level of anxiety will decrease Outcome: Progressing   Problem: Nutrition: Goal: Adequate nutrition will be maintained Outcome: Progressing   Problem: Activity: Goal: Risk for activity intolerance will decrease Outcome: Progressing   Problem: Elimination: Goal: Will not experience complications related to bowel motility Outcome: Progressing Goal: Will not experience complications related to urinary retention Outcome: Progressing   Problem: Pain  Managment: Goal: General experience of comfort will improve Outcome: Progressing   Problem: Safety: Goal: Ability to remain free from injury will improve Outcome: Progressing   Problem: Skin Integrity: Goal: Risk for impaired skin integrity will decrease Outcome: Progressing

## 2023-06-02 NOTE — Plan of Care (Signed)

## 2023-06-03 ENCOUNTER — Inpatient Hospital Stay: Payer: Medicare HMO

## 2023-06-03 DIAGNOSIS — I214 Non-ST elevation (NSTEMI) myocardial infarction: Secondary | ICD-10-CM | POA: Diagnosis not present

## 2023-06-03 LAB — BRAIN NATRIURETIC PEPTIDE: B Natriuretic Peptide: 960.5 pg/mL — ABNORMAL HIGH (ref 0.0–100.0)

## 2023-06-03 MED ORDER — DIPHENHYDRAMINE HCL 25 MG PO CAPS
25.0000 mg | ORAL_CAPSULE | Freq: Every evening | ORAL | Status: DC | PRN
  Administered 2023-06-03: 25 mg via ORAL
  Filled 2023-06-03: qty 1

## 2023-06-03 MED ORDER — MELATONIN 5 MG PO TABS
5.0000 mg | ORAL_TABLET | Freq: Every day | ORAL | Status: DC
  Administered 2023-06-03 – 2023-06-05 (×3): 5 mg via ORAL
  Filled 2023-06-03 (×3): qty 1

## 2023-06-03 NOTE — TOC Progression Note (Addendum)
Transition of Care [facility]) - Progression Note    Patient Details  Name: Kelly Ritter MRN: [id number] Date of Birth: April 03, 1934  Transition of Care [facility]) CM/SW Contact  Truddie Hidden, RN Phone Number: 06/03/2023, 10:09 AM  Clinical Narrative:    Spoke with patient's daughter, Herbert Seta regarding discharge plan for home. Herbert Seta is requesting for patient to go to SNF prior to returning home. Heather advised patient is requesting to return home with [facility]. Herbert Seta stated she would talk to patient . She has concerns about her being home alone  and safety at this time.   10:11am Retrieved incoming call from [facility]. She is requesting to speak with MD. MD notified.    Expected Discharge Plan: Home w Home Health Services Barriers to Discharge: Continued Medical Work up  Expected Discharge Plan and Services       Living arrangements for the past 2 months: Single Family Home                                       Social Determinants of Health (SDOH) Interventions SDOH Screenings   Food Insecurity: No Food Insecurity (05/29/2023)  Housing: Low Risk  (05/29/2023)  Transportation Needs: No Transportation Needs (05/29/2023)  Utilities: Not At Risk (05/29/2023)  Alcohol Screen: Low Risk  (03/22/2020)  Depression (PHQ2-9): Medium Risk (03/22/2020)  Financial Resource Strain: Low Risk  (02/26/2022)   [facility] from [facility]  Physical Activity: Inactive (03/22/2020)  Social Connections: Unknown (03/22/2020)  Stress: No Stress Concern Present (03/22/2020)  Tobacco Use: Medium Risk (05/29/2023)    Readmission Risk Interventions    05/29/2023   11:19 AM  Readmission Risk Prevention Plan  Transportation Screening Complete  PCP or Specialist Appt within 3-5 Days Complete  HRI or Home Care Consult Complete  Social Work Consult for Recovery Care Planning/Counseling Complete  Palliative Care Screening Not Applicable  Medication Review Oceanographer) Not  Complete  Med Review Comments Patient will discuss with nurse and MD upon discharge

## 2023-06-03 NOTE — Progress Notes (Signed)
PT Cancellation Note  Patient Details Name: Kelly Ritter MRN: [id number] DOB: 02-11-1934   Cancelled Treatment:    Reason Eval/Treat Not Completed: Medical issues which prohibited therapy (Treatment session attempted.  Patient just returned to bed, notably fatigued.  Also pending t-spine, l-spine imaging.  Will hold session at this time and re-attempt at later time/date as medically appropriate.)   [name] H. Manson Passey, PT, DPT, NCS 06/03/23, 3:27 PM [phone]

## 2023-06-03 NOTE — Progress Notes (Signed)
Occupational Therapy Treatment Patient Details Name: Kelly Ritter MRN: [id number] DOB: Jan 19, 1934 Today's Date: 06/03/2023   History of present illness 89 year old Caucasian female with history sniffing for osteoarthritis, anxiety, CAD, GERD, hypertension, hyperlipidemia, CVA who presents to the ED with acute onset of generalized weakness and dizziness with subsequent fall. Patient also had elevation of troponin, suspect supply/demand ischemia. 05/28/24 Xray Pelvis: Minimally displaced fracture of the right inferior pubic ramus.  Probable nondisplaced fracture of the right superior pubic ramus adjacent to the pubic symphysis.   OT comments  Ms Fakes was seen for OT treatment on this date. Upon arrival to room pt reclined in bed, agreeable to tx. Pt requires  CGA + RW for ADL t/f ~30 ft and standing grooming tasks, requires seated rest break citing fatigue. Pt reports back pain limiting progression, imaging pending. Pt making good progress toward goals, will continue to follow POC. Discharge recommendation remains appropriate.        If plan is discharge home, recommend the following:  Supervision due to cognitive status;Assist for transportation;Assistance with cooking/housework;A lot of help with walking and/or transfers;A lot of help with bathing/dressing/bathroom   Equipment Recommendations  BSC/3in1    Recommendations for Other Services      Precautions / Restrictions Precautions Precautions: Fall Restrictions Weight Bearing Restrictions: Yes RLE Weight Bearing: Weight bearing as tolerated       Mobility Bed Mobility Overal bed mobility: Needs Assistance Bed Mobility: Supine to Sit, Sit to Supine     Supine to sit: Supervision Sit to supine: Supervision        Transfers Overall transfer level: Needs assistance Equipment used: Rolling walker (2 wheels) Transfers: Sit to/from Stand Sit to Stand: Contact guard assist           General transfer comment: x3 stands  at bed     Balance Overall balance assessment: Needs assistance Sitting-balance support: No upper extremity supported, Feet supported Sitting balance-Leahy Scale: Good     Standing balance support: Bilateral upper extremity supported, Reliant on assistive device for balance, During functional activity Standing balance-Leahy Scale: Good                             ADL either performed or assessed with clinical judgement   ADL Overall ADL's : Needs assistance/impaired                                       General ADL Comments: CGA + RW for simulated toilet t/f and standing grooming tasks, requires seated rest break citing fatigue      Cognition Arousal: Alert Behavior During Therapy: WFL for tasks assessed/performed Overall Cognitive Status: Within Functional Limits for tasks assessed                                                     Pertinent Vitals/ Pain       Pain Assessment Pain Assessment: Faces Faces Pain Scale: Hurts little more Pain Location: R flank Pain Descriptors / Indicators: Grimacing Pain Intervention(s): Limited activity within patient's tolerance, Repositioned   Frequency  Min 1X/week        Progress Toward Goals  OT Goals(current goals can now be found in the care  plan section)  Progress towards OT goals: Progressing toward goals  Acute Rehab OT Goals Patient Stated Goal: to go home OT Goal Formulation: With patient/family Time For Goal Achievement: 06/13/23 Potential to Achieve Goals: Good ADL Goals Pt Will Perform Grooming: with set-up;with supervision;standing Pt Will Perform Lower Body Dressing: sit to/from stand;with set-up;with supervision Pt Will Transfer to Toilet: with modified independence;ambulating;regular height toilet  Plan      Co-evaluation                 AM-PAC OT "6 Clicks" Daily Activity     Outcome Measure   Help from another person eating meals?:  None Help from another person taking care of personal grooming?: A Little Help from another person toileting, which includes using toliet, bedpan, or urinal?: A Little Help from another person bathing (including washing, rinsing, drying)?: A Lot Help from another person to put on and taking off regular upper body clothing?: A Little Help from another person to put on and taking off regular lower body clothing?: A Lot 6 Click Score: 17    End of Session Equipment Utilized During Treatment: Rolling walker (2 wheels)  OT Visit Diagnosis: Other abnormalities of gait and mobility (R26.89);Muscle weakness (generalized) (M62.81)   Activity Tolerance Patient tolerated treatment well   Patient Left in bed;with call bell/phone within reach;with bed alarm set   Nurse Communication Mobility status        Time: 1610-9604 OT Time Calculation (min): 21 min  Charges: OT General Charges $OT Visit: 1 Visit OT Treatments $Self Care/Home Management : 8-22 mins  Kathie Dike, M.S. OTR/L  06/03/23, 3:51 PM  ascom [phone]

## 2023-06-03 NOTE — Progress Notes (Signed)
PROGRESS NOTE    Kelly Ritter  QMV:[id number] DOB: 1934/03/11 DOA: 05/29/2023 PCP: Norval Morton, MD    Brief Narrative:   89 year old Caucasian female with history sniffing for osteoarthritis, anxiety, CAD, GERD, hypertension, hyperlipidemia, CVA who presents to the ED with acute onset of generalized weakness and dizziness with subsequent fall.  Per patient she has been feeling increasingly weak however did not report any deterioration in her oral intake or fluid intake.   She presented after mechanical fall.  At this time unable to exclude syncopal event.  Was associated with generalized weakness and dizziness.  Unfortunately fall resulted in right inferior pubic ramus fracture.   Patient also had elevation of troponin concerning for NSTEMI.  Initially started on heparin gtt.  Cardiology consulted.  Stopped heparin as no plans for ischemic evaluation and patient receiving blood transfusion.  Patient has acute on chronic anemia and acute kidney injury.  Etiologies are somewhat unclear.  Will hydrate and transfuse and monitor as appropriate.  10/18: Creatinine improving.  Hb stable 10/19: Creatinine essentially normalized.  Hemoglobin remained stable. 10/20: Medically stable for discharge to skilled nursing facility.  Pending placement. 10/22: Patient expresses desire to go home however discussed with therapy and patient is a max assist.  Recommendation remains for STR.   Assessment & Plan:   Principal Problem:   NSTEMI (non-ST elevated myocardial infarction) (HCC) Active Problems:   AKI (acute kidney injury) (HCC)   Fall at home, initial encounter   Dyslipidemia   Elevated transaminase level   Dementia with behavioral disturbance (HCC)   GERD without esophagitis   Hypothyroidism   Closed fracture of right inferior pubic ramus (HCC)  Elevated troponin NSTEMI ruled out.  Troponins peaked at 300.  Subsequently downtrending.  Suspect supply/demand ischemia.  Seen by  cardiology.  Heparin gtt. stopped.  No indication for ischemic evaluation. Plan: EKG as needed chest pain Telemetry discontinued  AKI Suspect prerenal azotemia in the setting of poor p.o. intake and poor hydration.  Improved with IV fluids.  Creatinine near normal as of 10/19 Plan: Ensure p.o. fluid intake.  Monitor creatinine periodically Creatinine normalized  Acute on chronic iron deficiency anemia No signs of acute blood loss.  Patient's hemoglobin 6.9.  Microcytosis.  Iron indices indicative of iron deficiency anemia. Plan: Hemoglobin stable.  Received 300 mg IV Venofer 10/18. Continue p.o. iron supplementation.  25 mg Feosol daily  Mechanical fall Generalized weakness Right inferior pubic ramus fracture As needed pain control PT and OT consults, recommend SNF Medically stable for discharge to skilled nursing facility Pending placement  Hyperlipidemia Statin  Hypothyroidism Synthroid  GERD PPI  Dementia with behavioral disturbance Aricept   DVT prophylaxis: SQ Lovenox Code Status: DNR Family Communication: Daughter at bedside 10/18, via phon 10/22 Disposition Plan: Status is: Inpatient Remains inpatient appropriate because: Unsafe discharge plan.  Need skilled nursing facility.   Level of care: Telemetry Cardiac  Consultants:  None  Procedures:  None  Antimicrobials: None   Subjective: Seen and examined.  Sitting up in chair.  No visible distress.  Expresses strong desire to go home.  Objective: Vitals:   06/02/23 2352 06/03/23 0309 06/03/23 0816 06/03/23 1242  BP: (!) 153/60 (!) 152/42 (!) 147/55 (!) 158/51  Pulse: (!) 53 (!) 53 (!) 56 (!) 55  Resp: 17 18 18 16   Temp: 98.1 F (36.7 C) 97.9 F (36.6 C) 97.9 F (36.6 C) 97.6 F (36.4 C)  TempSrc:   Oral   SpO2: 96% 96% 96%  96%  Weight:      Height:        Intake/Output Summary (Last 24 hours) at 06/03/2023 1301 Last data filed at 06/03/2023 0800 Gross per 24 hour  Intake --  Output  1700 ml  Net -1700 ml   Filed Weights   05/29/23 0114  Weight: 82.8 kg    Examination:  General exam: No acute distress Respiratory system: Lungs clear.  Normal work of breathing.  Room air Cardiovascular system: S1-S2, RRR, no murmurs, no pedal edema Gastrointestinal system: Soft, NT/ND, normal bowel sounds Central nervous system: Alert and oriented. No focal neurological deficits. Extremities: Symmetric 5 x 5 power.  Decreased range of motion right hip Skin: No rashes, lesions or ulcers Psychiatry: Judgement and insight appear normal. Mood & affect appropriate.     Data Reviewed: I have personally reviewed following labs and imaging studies  CBC: Recent Labs  Lab 05/29/23 0115 05/29/23 0449 05/29/23 1141 05/29/23 1612 05/30/23 0829 05/31/23 0842  WBC 7.7 8.3  --   --  5.8 6.6  NEUTROABS 5.7  --   --   --  4.0 4.7  HGB 7.3* 6.9* 8.2* 8.3* 8.8* 8.5*  HCT 25.7* 24.2* 27.6* 26.9* 29.4* 28.8*  MCV 79.3* 78.8*  --   --  80.8 81.1  PLT 315 287  --   --  251 269   Basic Metabolic Panel: Recent Labs  Lab 05/29/23 0115 05/29/23 0449 05/30/23 0829 05/31/23 0842  NA 135 136 136 138  K 4.3 4.5 4.5 4.2  CL 101 103 105 105  CO2 21* 23 23 26   GLUCOSE 157* 101* 138* 139*  BUN 38* 37* 33* 22  CREATININE 2.31* 2.08* 1.67* 1.06*  CALCIUM 8.0* 7.9* 7.9* 8.4*  MG 2.0  --   --   --    GFR: Estimated Creatinine Clearance: 39.4 mL/min (A) (by C-G formula based on SCr of 1.06 mg/dL (H)). Liver Function Tests: Recent Labs  Lab 05/29/23 0115  AST 186*  ALT 88*  ALKPHOS 107  BILITOT 0.7  PROT 7.1  ALBUMIN 3.3*   No results for input(s): "LIPASE", "AMYLASE" in the last 168 hours. No results for input(s): "AMMONIA" in the last 168 hours. Coagulation Profile: Recent Labs  Lab 05/29/23 0115  INR 1.4*   Cardiac Enzymes: No results for input(s): "CKTOTAL", "CKMB", "CKMBINDEX", "TROPONINI" in the last 168 hours. BNP (last 3 results) No results for input(s): "PROBNP" in  the last 8760 hours. HbA1C: No results for input(s): "HGBA1C" in the last 72 hours. CBG: No results for input(s): "GLUCAP" in the last 168 hours. Lipid Profile: No results for input(s): "CHOL", "HDL", "LDLCALC", "TRIG", "CHOLHDL", "LDLDIRECT" in the last 72 hours.  Thyroid Function Tests: No results for input(s): "TSH", "T4TOTAL", "FREET4", "T3FREE", "THYROIDAB" in the last 72 hours.  Anemia Panel: No results for input(s): "VITAMINB12", "FOLATE", "FERRITIN", "TIBC", "IRON", "RETICCTPCT" in the last 72 hours.  Sepsis Labs: No results for input(s): "PROCALCITON", "LATICACIDVEN" in the last 168 hours.  Recent Results (from the past 240 hour(s))  SARS Coronavirus 2 by RT PCR (hospital order, performed in [facility] hospital lab) *cepheid single result test* Anterior Nasal Swab     Status: None   Collection Time: 05/29/23  1:46 AM   Specimen: Anterior Nasal Swab  Result Value Ref Range Status   SARS Coronavirus 2 by RT PCR NEGATIVE NEGATIVE Final    Comment: (NOTE) SARS-CoV-2 target nucleic acids are NOT DETECTED.  The SARS-CoV-2 RNA is generally detectable in upper  and lower respiratory specimens during the acute phase of infection. The lowest concentration of SARS-CoV-2 viral copies this assay can detect is 250 copies / mL. A negative result does not preclude SARS-CoV-2 infection and should not be used as the sole basis for treatment or other patient management decisions.  A negative result may occur with improper specimen collection / handling, submission of specimen other than nasopharyngeal swab, presence of viral mutation(s) within the areas targeted by this assay, and inadequate number of viral copies (<250 copies / mL). A negative result must be combined with clinical observations, patient history, and epidemiological information.  Fact Sheet for Patients:   RoadLapTop.co.za  Fact Sheet for Healthcare  Providers: http://kim-miller.com/  This test is not yet approved or  cleared by the [place] FDA and has been authorized for detection and/or diagnosis of SARS-CoV-2 by FDA under an Emergency Use Authorization (EUA).  This EUA will remain in effect (meaning this test can be used) for the duration of the COVID-19 declaration under Section 564(b)(1) of the Act, 21 U.S.C. section 360bbb-3(b)(1), unless the authorization is terminated or revoked sooner.  Performed at [facility], [address] [place], [place] [address]          Radiology Studies: DG Chest [place] 1 View  Result Date: 06/02/2023 CLINICAL DATA:  Recent fall with shortness of breath, initial encounter EXAM: PORTABLE CHEST 1 VIEW COMPARISON:  05/29/2023 FINDINGS: Cardiac shadow is enlarged. Aortic calcifications are noted. The lungs are well aerated bilaterally. No focal infiltrate or effusion is seen. No bony abnormality is noted. IMPRESSION: No active disease. Electronically Signed   By: Alcide Clever M.D.   On: 06/02/2023 19:19        Scheduled Meds:  sodium chloride   Intravenous Once   aspirin  81 mg Oral Daily   atorvastatin  80 mg Oral QHS   cholecalciferol  1,000 Units Oral Daily   donepezil  10 mg Oral QHS   DULoxetine  60 mg Oral Daily   feeding supplement  237 mL Oral BID BM   ferrous sulfate  325 mg Oral Q breakfast   gabapentin  100 mg Oral TID   isosorbide mononitrate  30 mg Oral Daily   levothyroxine  25 mcg Oral Q0600   multivitamin with minerals  1 tablet Oral Daily   pantoprazole  40 mg Oral QHS   rOPINIRole  0.5 mg Oral QHS   senna  2 tablet Oral QHS   traZODone  50 mg Oral QHS   Continuous Infusions:   LOS: 5 days    Tresa Moore, MD [facility]   If 7PM-7AM, please contact night-coverage  06/03/2023, 1:01 PM

## 2023-06-03 NOTE — Plan of Care (Signed)

## 2023-06-04 ENCOUNTER — Inpatient Hospital Stay
Admit: 2023-06-04 | Discharge: 2023-06-04 | Disposition: A | Discharge status: Home / Self Care | Payer: Medicare HMO | Attending: Internal Medicine

## 2023-06-04 ENCOUNTER — Inpatient Hospital Stay: Payer: Medicare HMO

## 2023-06-04 DIAGNOSIS — I214 Non-ST elevation (NSTEMI) myocardial infarction: Secondary | ICD-10-CM | POA: Diagnosis not present

## 2023-06-04 DIAGNOSIS — I340 Nonrheumatic mitral (valve) insufficiency: Secondary | ICD-10-CM | POA: Insufficient documentation

## 2023-06-04 LAB — ECHOCARDIOGRAM COMPLETE
AR max vel: 1.62 cm2
AV Area VTI: 1.83 cm2
AV Area mean vel: 1.76 cm2
AV Mean grad: 10.7 mm[Hg]
AV Peak grad: 17.9 mm[Hg]
Ao pk vel: 2.11 m/s
Area-P 1/2: 2.87 cm2
Height: 64.5 in
S' Lateral: 2.7 cm
Weight: 2920.65 [oz_av]

## 2023-06-04 MED ORDER — PANTOPRAZOLE SODIUM 40 MG IV SOLR
40.0000 mg | Freq: Two times a day (BID) | INTRAVENOUS | Status: DC
  Administered 2023-06-04 – 2023-06-06 (×4): 40 mg via INTRAVENOUS
  Filled 2023-06-04 (×4): qty 10

## 2023-06-04 NOTE — Progress Notes (Signed)
lower respiratory specimens during the acute phase of infection. The lowest concentration of SARS-CoV-2 viral copies this assay can detect is 250 copies / mL. A negative result does not preclude SARS-CoV-2 infection and should not be used as the sole basis for treatment or other patient management decisions.  A negative result may occur with improper specimen collection / handling, submission of specimen other than nasopharyngeal swab, presence of viral mutation(s) within the areas targeted by this assay, and inadequate number of viral copies (<250 copies / mL). A negative result must be combined with clinical observations, patient history, and epidemiological information.  Fact Sheet for Patients:   RoadLapTop.co.za  Fact Sheet for Healthcare  Providers: http://kim-miller.com/  This test is not yet approved or  cleared by the [place] FDA and has been authorized for detection and/or diagnosis of SARS-CoV-2 by FDA under an Emergency Use Authorization (EUA).  This EUA will remain in effect (meaning this test can be used) for the duration of the COVID-19 declaration under Section 564(b)(1) of the Act, 21 U.S.C. section 360bbb-3(b)(1), unless the authorization is terminated or revoked sooner.  Performed at [facility], [address], [place], [place] [address]          Radiology Studies: ECHOCARDIOGRAM COMPLETE  Result Date: 06/04/2023    ECHOCARDIOGRAM REPORT   Patient Name:   Kelly Ritter Date of Exam: 06/04/2023 Medical Rec #:  [id number]       Height:       64.5 in Accession #:    [id number]      Weight:       182.5 lb Date of Birth:  12/29/1933        BSA:          1.892 m Patient Age:    89 years        BP:           137/42 mmHg Patient Gender: F               HR:           53 bpm. Exam Location:  [facility] Procedure: 2D Echo, Cardiac Doppler and Color Doppler Indications:     CHF-acute diastolic I50.31  History:         Patient has prior history of Echocardiogram examinations, most                  recent 12/25/2020. CAD and Previous Myocardial Infarction; Risk                  Factors:Hypertension.  Sonographer:     Kelly Ritter Referring Phys:  [id number] Kelly Ritter Diagnosing Phys: Kelly Millard MD IMPRESSIONS  1. Left ventricular ejection fraction, by estimation, is 60 to 65%. The left ventricle has normal function. The left ventricle has no regional wall motion abnormalities. Left ventricular diastolic parameters were normal.  2. Right ventricular systolic function is normal. The right ventricular size is normal.  3. Left atrial size was mildly dilated.  4. Right atrial size was moderately dilated.  5. The mitral valve is normal in structure. Moderate mitral valve regurgitation. No  evidence of mitral stenosis.  6. The aortic valve is normal in structure. Aortic valve regurgitation is mild. Mild aortic valve stenosis.  7. The inferior vena cava is normal in size with greater than 50% respiratory variability, suggesting right atrial pressure of 3 mmHg. FINDINGS  Left Ventricle: Left ventricular ejection fraction, by estimation, is 60 to 65%. The left  PROGRESS NOTE    Kelly Ritter  UYQ:[id number] DOB: Dec 15, 1933 DOA: 05/29/2023 PCP: Kelly Morton, MD    Brief Narrative:   89 year old Caucasian female with history sniffing for osteoarthritis, anxiety, CAD, GERD, hypertension, hyperlipidemia, CVA who presents to the ED with acute onset of generalized weakness and dizziness with subsequent fall.  Per patient she has been feeling increasingly weak however did not report any deterioration in her oral intake or fluid intake.   She presented after mechanical fall.  At this time unable to exclude syncopal event.  Was associated with generalized weakness and dizziness.  Unfortunately fall resulted in right inferior pubic ramus fracture.   Patient also had elevation of troponin concerning for NSTEMI.  Initially started on heparin gtt.  Cardiology consulted.  Stopped heparin as no plans for ischemic evaluation and patient receiving blood transfusion.  Patient has acute on chronic anemia and acute kidney injury.  Etiologies are somewhat unclear.  Will hydrate and transfuse and monitor as appropriate.  10/18: Creatinine improving.  Hb stable 10/19: Creatinine essentially normalized.  Hemoglobin remained stable. 10/20: Medically stable for discharge to skilled nursing facility.  Pending placement. 10/22: Patient expresses desire to go home however discussed with therapy and patient is a max assist.  Recommendation remains for STR.   Assessment & Plan:   Principal Problem:   NSTEMI (non-ST elevated myocardial infarction) (HCC) Active Problems:   AKI (acute kidney injury) (HCC)   Fall at home, initial encounter   Dyslipidemia   Elevated transaminase level   Dementia with behavioral disturbance (HCC)   GERD without esophagitis   Hypothyroidism   Closed fracture of right inferior pubic ramus (HCC)   Mitral regurgitation  Elevated troponin Mitral regurgitation NSTEMI ruled out.  Troponins peaked at 300.  Subsequently downtrending.   Suspect supply/demand ischemia.  Seen by cardiology.  Heparin gtt. stopped.  No indication for ischemic evaluation. TTE remarkable only for moderate MR Plan: EKG as needed chest pain Telemetry discontinued  AKI Suspect prerenal azotemia in the setting of poor p.o. intake and poor hydration. resolved  Acute on chronic iron deficiency anemia No signs of acute blood loss.  Patient's hemoglobin 6.9.  Microcytosis.  Iron indices indicative of iron deficiency anemia. Received 1 unit. Patient endorses black stools - start ppi - will discuss w/ GI  Mechanical fall Generalized weakness Right inferior pubic ramus fracture As needed pain control PT and OT consults, recommend SNF Medically stable for discharge to skilled nursing facility Pending placement  Lumbar degenerative changes History L45 discectomy in 2022. Now with worsening lumbar back pain and progressive weakness - will check mri of lumbar spine  CAD - will hold asa pending anemia eval  Hyperlipidemia Statin  Hypothyroidism Synthroid  GERD PPI  Dementia with behavioral disturbance Aricept   DVT prophylaxis: scds Code Status: DNR Family Communication: Daughter updated telephonically 10/23 Disposition Plan: Status is: Inpatient Remains inpatient appropriate because: Unsafe discharge plan.  Need skilled nursing facility.   Level of care: Telemetry Cardiac  Consultants:  None  Procedures:  None  Antimicrobials: None   Subjective: Seen and examined. Ongoing low back pain. No dyspnea. Tolerating diet  Objective: Vitals:   06/04/23 0320 06/04/23 0821 06/04/23 0825 06/04/23 1236  BP: (!) 139/47 (!) 137/42  (!) 150/54  Pulse: (!) 52 (!) 53  (!) 54  Resp: 18 18  16   Temp: 98 F (36.7 C) 98 F (36.7 C)  (!) 97.4 F (36.3 C)  TempSrc: Oral Oral  Oral  lower respiratory specimens during the acute phase of infection. The lowest concentration of SARS-CoV-2 viral copies this assay can detect is 250 copies / mL. A negative result does not preclude SARS-CoV-2 infection and should not be used as the sole basis for treatment or other patient management decisions.  A negative result may occur with improper specimen collection / handling, submission of specimen other than nasopharyngeal swab, presence of viral mutation(s) within the areas targeted by this assay, and inadequate number of viral copies (<250 copies / mL). A negative result must be combined with clinical observations, patient history, and epidemiological information.  Fact Sheet for Patients:   RoadLapTop.co.za  Fact Sheet for Healthcare  Providers: http://kim-miller.com/  This test is not yet approved or  cleared by the [place] FDA and has been authorized for detection and/or diagnosis of SARS-CoV-2 by FDA under an Emergency Use Authorization (EUA).  This EUA will remain in effect (meaning this test can be used) for the duration of the COVID-19 declaration under Section 564(b)(1) of the Act, 21 U.S.C. section 360bbb-3(b)(1), unless the authorization is terminated or revoked sooner.  Performed at [facility], [address], [place], [place] [address]          Radiology Studies: ECHOCARDIOGRAM COMPLETE  Result Date: 06/04/2023    ECHOCARDIOGRAM REPORT   Patient Name:   Kelly Ritter Date of Exam: 06/04/2023 Medical Rec #:  [id number]       Height:       64.5 in Accession #:    [id number]      Weight:       182.5 lb Date of Birth:  12/29/1933        BSA:          1.892 m Patient Age:    89 years        BP:           137/42 mmHg Patient Gender: F               HR:           53 bpm. Exam Location:  [facility] Procedure: 2D Echo, Cardiac Doppler and Color Doppler Indications:     CHF-acute diastolic I50.31  History:         Patient has prior history of Echocardiogram examinations, most                  recent 12/25/2020. CAD and Previous Myocardial Infarction; Risk                  Factors:Hypertension.  Sonographer:     Kelly Ritter Referring Phys:  [id number] Kelly Ritter Diagnosing Phys: Kelly Millard MD IMPRESSIONS  1. Left ventricular ejection fraction, by estimation, is 60 to 65%. The left ventricle has normal function. The left ventricle has no regional wall motion abnormalities. Left ventricular diastolic parameters were normal.  2. Right ventricular systolic function is normal. The right ventricular size is normal.  3. Left atrial size was mildly dilated.  4. Right atrial size was moderately dilated.  5. The mitral valve is normal in structure. Moderate mitral valve regurgitation. No  evidence of mitral stenosis.  6. The aortic valve is normal in structure. Aortic valve regurgitation is mild. Mild aortic valve stenosis.  7. The inferior vena cava is normal in size with greater than 50% respiratory variability, suggesting right atrial pressure of 3 mmHg. FINDINGS  Left Ventricle: Left ventricular ejection fraction, by estimation, is 60 to 65%. The left  PROGRESS NOTE    Kelly Ritter  UYQ:[id number] DOB: Dec 15, 1933 DOA: 05/29/2023 PCP: Kelly Morton, MD    Brief Narrative:   89 year old Caucasian female with history sniffing for osteoarthritis, anxiety, CAD, GERD, hypertension, hyperlipidemia, CVA who presents to the ED with acute onset of generalized weakness and dizziness with subsequent fall.  Per patient she has been feeling increasingly weak however did not report any deterioration in her oral intake or fluid intake.   She presented after mechanical fall.  At this time unable to exclude syncopal event.  Was associated with generalized weakness and dizziness.  Unfortunately fall resulted in right inferior pubic ramus fracture.   Patient also had elevation of troponin concerning for NSTEMI.  Initially started on heparin gtt.  Cardiology consulted.  Stopped heparin as no plans for ischemic evaluation and patient receiving blood transfusion.  Patient has acute on chronic anemia and acute kidney injury.  Etiologies are somewhat unclear.  Will hydrate and transfuse and monitor as appropriate.  10/18: Creatinine improving.  Hb stable 10/19: Creatinine essentially normalized.  Hemoglobin remained stable. 10/20: Medically stable for discharge to skilled nursing facility.  Pending placement. 10/22: Patient expresses desire to go home however discussed with therapy and patient is a max assist.  Recommendation remains for STR.   Assessment & Plan:   Principal Problem:   NSTEMI (non-ST elevated myocardial infarction) (HCC) Active Problems:   AKI (acute kidney injury) (HCC)   Fall at home, initial encounter   Dyslipidemia   Elevated transaminase level   Dementia with behavioral disturbance (HCC)   GERD without esophagitis   Hypothyroidism   Closed fracture of right inferior pubic ramus (HCC)   Mitral regurgitation  Elevated troponin Mitral regurgitation NSTEMI ruled out.  Troponins peaked at 300.  Subsequently downtrending.   Suspect supply/demand ischemia.  Seen by cardiology.  Heparin gtt. stopped.  No indication for ischemic evaluation. TTE remarkable only for moderate MR Plan: EKG as needed chest pain Telemetry discontinued  AKI Suspect prerenal azotemia in the setting of poor p.o. intake and poor hydration. resolved  Acute on chronic iron deficiency anemia No signs of acute blood loss.  Patient's hemoglobin 6.9.  Microcytosis.  Iron indices indicative of iron deficiency anemia. Received 1 unit. Patient endorses black stools - start ppi - will discuss w/ GI  Mechanical fall Generalized weakness Right inferior pubic ramus fracture As needed pain control PT and OT consults, recommend SNF Medically stable for discharge to skilled nursing facility Pending placement  Lumbar degenerative changes History L45 discectomy in 2022. Now with worsening lumbar back pain and progressive weakness - will check mri of lumbar spine  CAD - will hold asa pending anemia eval  Hyperlipidemia Statin  Hypothyroidism Synthroid  GERD PPI  Dementia with behavioral disturbance Aricept   DVT prophylaxis: scds Code Status: DNR Family Communication: Daughter updated telephonically 10/23 Disposition Plan: Status is: Inpatient Remains inpatient appropriate because: Unsafe discharge plan.  Need skilled nursing facility.   Level of care: Telemetry Cardiac  Consultants:  None  Procedures:  None  Antimicrobials: None   Subjective: Seen and examined. Ongoing low back pain. No dyspnea. Tolerating diet  Objective: Vitals:   06/04/23 0320 06/04/23 0821 06/04/23 0825 06/04/23 1236  BP: (!) 139/47 (!) 137/42  (!) 150/54  Pulse: (!) 52 (!) 53  (!) 54  Resp: 18 18  16   Temp: 98 F (36.7 C) 98 F (36.7 C)  (!) 97.4 F (36.3 C)  TempSrc: Oral Oral  Oral  PROGRESS NOTE    Kelly Ritter  UYQ:[id number] DOB: Dec 15, 1933 DOA: 05/29/2023 PCP: Kelly Morton, MD    Brief Narrative:   89 year old Caucasian female with history sniffing for osteoarthritis, anxiety, CAD, GERD, hypertension, hyperlipidemia, CVA who presents to the ED with acute onset of generalized weakness and dizziness with subsequent fall.  Per patient she has been feeling increasingly weak however did not report any deterioration in her oral intake or fluid intake.   She presented after mechanical fall.  At this time unable to exclude syncopal event.  Was associated with generalized weakness and dizziness.  Unfortunately fall resulted in right inferior pubic ramus fracture.   Patient also had elevation of troponin concerning for NSTEMI.  Initially started on heparin gtt.  Cardiology consulted.  Stopped heparin as no plans for ischemic evaluation and patient receiving blood transfusion.  Patient has acute on chronic anemia and acute kidney injury.  Etiologies are somewhat unclear.  Will hydrate and transfuse and monitor as appropriate.  10/18: Creatinine improving.  Hb stable 10/19: Creatinine essentially normalized.  Hemoglobin remained stable. 10/20: Medically stable for discharge to skilled nursing facility.  Pending placement. 10/22: Patient expresses desire to go home however discussed with therapy and patient is a max assist.  Recommendation remains for STR.   Assessment & Plan:   Principal Problem:   NSTEMI (non-ST elevated myocardial infarction) (HCC) Active Problems:   AKI (acute kidney injury) (HCC)   Fall at home, initial encounter   Dyslipidemia   Elevated transaminase level   Dementia with behavioral disturbance (HCC)   GERD without esophagitis   Hypothyroidism   Closed fracture of right inferior pubic ramus (HCC)   Mitral regurgitation  Elevated troponin Mitral regurgitation NSTEMI ruled out.  Troponins peaked at 300.  Subsequently downtrending.   Suspect supply/demand ischemia.  Seen by cardiology.  Heparin gtt. stopped.  No indication for ischemic evaluation. TTE remarkable only for moderate MR Plan: EKG as needed chest pain Telemetry discontinued  AKI Suspect prerenal azotemia in the setting of poor p.o. intake and poor hydration. resolved  Acute on chronic iron deficiency anemia No signs of acute blood loss.  Patient's hemoglobin 6.9.  Microcytosis.  Iron indices indicative of iron deficiency anemia. Received 1 unit. Patient endorses black stools - start ppi - will discuss w/ GI  Mechanical fall Generalized weakness Right inferior pubic ramus fracture As needed pain control PT and OT consults, recommend SNF Medically stable for discharge to skilled nursing facility Pending placement  Lumbar degenerative changes History L45 discectomy in 2022. Now with worsening lumbar back pain and progressive weakness - will check mri of lumbar spine  CAD - will hold asa pending anemia eval  Hyperlipidemia Statin  Hypothyroidism Synthroid  GERD PPI  Dementia with behavioral disturbance Aricept   DVT prophylaxis: scds Code Status: DNR Family Communication: Daughter updated telephonically 10/23 Disposition Plan: Status is: Inpatient Remains inpatient appropriate because: Unsafe discharge plan.  Need skilled nursing facility.   Level of care: Telemetry Cardiac  Consultants:  None  Procedures:  None  Antimicrobials: None   Subjective: Seen and examined. Ongoing low back pain. No dyspnea. Tolerating diet  Objective: Vitals:   06/04/23 0320 06/04/23 0821 06/04/23 0825 06/04/23 1236  BP: (!) 139/47 (!) 137/42  (!) 150/54  Pulse: (!) 52 (!) 53  (!) 54  Resp: 18 18  16   Temp: 98 F (36.7 C) 98 F (36.7 C)  (!) 97.4 F (36.3 C)  TempSrc: Oral Oral  Oral

## 2023-06-04 NOTE — Progress Notes (Signed)
*  PRELIMINARY RESULTS* Echocardiogram 2D Echocardiogram has been performed.  Cristela Blue 06/04/2023, 10:00 AM

## 2023-06-04 NOTE — Progress Notes (Signed)
Physical Therapy Treatment Patient Details Name: Kelly Ritter MRN: [id number] DOB: 1934/05/12 Today's Date: 06/04/2023   History of Present Illness 89 year old Caucasian female with history sniffing for osteoarthritis, anxiety, CAD, GERD, hypertension, hyperlipidemia, CVA who presents to the ED with acute onset of generalized weakness and dizziness with subsequent fall. Patient also had elevation of troponin, suspect supply/demand ischemia. 05/28/24 Xray Pelvis: Minimally displaced fracture of the right inferior pubic ramus.  Probable nondisplaced fracture of the right superior pubic ramus adjacent to the pubic symphysis.    PT Comments  Pt received in bed with c/o significant pain in Right flank/lower back area. Nursing administered IV Morphine with some releif, however pt became nauseated with c/o lightheadedness once assisted to EOB. After short rest and PLB, pt able to stand at [facility] and advance a few steps to bedside chair. LE's elevated and cool cloth placed on forehead. HR 61 bpm (pt's baseline). Nursing notified, will attempt to progress next session.   If plan is discharge home, recommend the following: A little help with walking and/or transfers;A little help with bathing/dressing/bathroom;Assist for transportation;Help with stairs or ramp for entrance;Assistance with cooking/housework   Can travel by private vehicle     Yes  Equipment Recommendations  Other (comment) (TBD at next level of care)    Recommendations for Other Services       Precautions / Restrictions Precautions Precautions: Fall Restrictions Weight Bearing Restrictions: Yes RLE Weight Bearing: Weight bearing as tolerated     Mobility  Bed Mobility Overal bed mobility: Needs Assistance Bed Mobility: Supine to Sit     Supine to sit: Min assist, HOB elevated, Used rails     General bed mobility comments: increased time and use of bed features, but able to complete with very minimal assis tfrom therapist  this date (towards R side of bed)    Transfers Overall transfer level: Needs assistance Equipment used: Rolling walker (2 wheels) Transfers: Sit to/from Stand Sit to Stand: Min assist, Contact guard assist           General transfer comment: Pt able to stand at RW on 2nd attempt    Ambulation/Gait Ambulation/Gait assistance: Contact guard assist Gait Distance (Feet): 3 Feet Assistive device: Rolling walker (2 wheels) Gait Pattern/deviations: Step-to pattern Gait velocity: dec     General Gait Details: Further gait distance deferred due to c/o nausea and lightheadednesss after receiving IV Morphine   Stairs             Wheelchair Mobility     Tilt Bed    Modified Rankin (Stroke Patients Only)       Balance Overall balance assessment: Needs assistance Sitting-balance support: No upper extremity supported, Feet supported Sitting balance-Leahy Scale: Good     Standing balance support: Bilateral upper extremity supported, Reliant on assistive device for balance, During functional activity Standing balance-Leahy Scale: Fair Standing balance comment: Good static standing with support of RW, pt requires RW during gait to offload pain                            Cognition Arousal: Alert Behavior During Therapy: WFL for tasks assessed/performed Overall Cognitive Status: Within Functional Limits for tasks assessed                                 General Comments: Pleasant and motivated        Exercises  General Comments        Pertinent Vitals/Pain Pain Assessment Pain Assessment: Faces Faces Pain Scale: Hurts even more Pain Location: R flank, pelvic area Pain Descriptors / Indicators: Grimacing    Home Living                          Prior Function            PT Goals (current goals can now be found in the care plan section) Acute Rehab PT Goals Patient Stated Goal: to be able to get back home     Frequency    Min 1X/week      PT Plan      Co-evaluation              AM-PAC PT "6 Clicks" Mobility   Outcome Measure  Help needed turning from your back to your side while in a flat bed without using bedrails?: None Help needed moving from lying on your back to sitting on the side of a flat bed without using bedrails?: A Little Help needed moving to and from a bed to a chair (including a wheelchair)?: A Little Help needed standing up from a chair using your arms (e.g., wheelchair or bedside chair)?: A Little Help needed to walk in hospital room?: A Little Help needed climbing 3-5 steps with a railing? : A Lot 6 Click Score: 18    End of Session Equipment Utilized During Treatment: Gait belt Activity Tolerance: Other (comment) (Limited due to affects of IV Morphine) Patient left: in chair;with call bell/phone within reach;with chair alarm set Nurse Communication: Mobility status PT Visit Diagnosis: Muscle weakness (generalized) (M62.81);Difficulty in walking, not elsewhere classified (R26.2)     Time: 0272-5366 PT Time Calculation (min) (ACUTE ONLY): 19 min  Charges:    $Therapeutic Activity: 8-22 mins                      Zadie Cleverly, PTA  Jannet Askew 06/04/2023, 3:20 PM

## 2023-06-04 NOTE — Plan of Care (Signed)
  Problem: Activity: Goal: Ability to tolerate increased activity will improve Outcome: Progressing   Problem: Cardiac: Goal: Ability to achieve and maintain adequate cardiovascular perfusion will improve Outcome: Progressing   Problem: Pain Managment: Goal: General experience of comfort will improve Outcome: Progressing   Problem: Safety: Goal: Ability to remain free from injury will improve Outcome: Progressing   Problem: Elimination: Goal: Will not experience complications related to urinary retention Outcome: Progressing   Problem: Elimination: Goal: Will not experience complications related to bowel motility Outcome: Progressing

## 2023-06-04 NOTE — Progress Notes (Signed)
Nutrition Follow-up  DOCUMENTATION CODES:   Obesity unspecified  INTERVENTION:   -Continue dysphagia 3 diet -Continue MVI with minerals daily -Continue Ensure Enlive po BID, each supplement provides 350 kcal and 20 grams of protein.  -Magic cup TID with meals, each supplement provides 290 kcal and 9 grams of protein   NUTRITION DIAGNOSIS:   Inadequate oral intake related to poor appetite as evidenced by per patient/family report.  Ongoing  GOAL:   Patient will meet greater than or equal to 90% of their needs  Progressing   MONITOR:   PO intake, Supplement acceptance  REASON FOR ASSESSMENT:   Consult Assessment of nutrition requirement/status  ASSESSMENT:   Pt with medical history significant for osteoarthritis, anxiety, coronary artery disease, GERD, hypertension, dyslipidemia, and CVA, who presented with acute onset of generalized weakness and dizziness and subsequent fall.  10/17- s/p BSE- advanced to dysphagia 3 diet with thin liquids   Reviewed I/O's: -280 ml x 24 hours and -1.4 L since admission  UOP: 700 ml x 24 hours   Pt remains with poor oral intake. Noted meal completions 0-50%. Pt with variable acceptance of Ensure supplements.   No new wt since admission.   Per TOC notes, awaiting SNF placement for discharge.   Medications reviewed and include vitamin D3, ferrous sulfate, neurontin, melatonin, and senokot.   Labs reviewed: CBGS: 204 (inpatient orders for glycemic control are none).    Diet Order:   Diet Order             DIET DYS 3 Room service appropriate? Yes with Assist; Fluid consistency: Thin  Diet effective now                   EDUCATION NEEDS:   Education needs have been addressed  Skin:  Skin Assessment: Reviewed RN Assessment  Last BM:  06/03/23 (type 3)  Height:   Ht Readings from Last 1 Encounters:  05/29/23 5' 4.5" (1.638 m)    Weight:   Wt Readings from Last 1 Encounters:  05/29/23 82.8 kg    Ideal Body  Weight:  55.7 kg  BMI:  Body mass index is 30.85 kg/m.  Estimated Nutritional Needs:   Kcal:  1650-1850  Protein:  85-100 grams  Fluid:  > 1.6 L    Levada Schilling, RD, LDN, CDCES Registered Dietitian III Certified Diabetes Care and Education Specialist Please refer to [facility] for RD and/or RD on-call/weekend/after hours pager

## 2023-06-04 NOTE — TOC Progression Note (Signed)
Transition of Care [facility]) - Progression Note    Patient Details  Name: Kelly Ritter MRN: [id number] Date of Birth: 04-23-1934  Transition of Care [facility]) CM/SW Contact  Truddie Hidden, RN Phone Number: 06/04/2023, 1:26 PM  Clinical Narrative:    Per Tammy in admissions at [facility], auth still pending.    Expected Discharge Plan: Home w Home Health Services Barriers to Discharge: Continued Medical Work up  Expected Discharge Plan and Services       Living arrangements for the past 2 months: Single Family Home                                       Social Determinants of Health (SDOH) Interventions SDOH Screenings   Food Insecurity: No Food Insecurity (05/29/2023)  Housing: Low Risk  (05/29/2023)  Transportation Needs: No Transportation Needs (05/29/2023)  Utilities: Not At Risk (05/29/2023)  Alcohol Screen: Low Risk  (03/22/2020)  Depression (PHQ2-9): Medium Risk (03/22/2020)  Financial Resource Strain: Low Risk  (02/26/2022)   Received from [facility]  Physical Activity: Inactive (03/22/2020)  Social Connections: Unknown (03/22/2020)  Stress: No Stress Concern Present (03/22/2020)  Tobacco Use: Medium Risk (05/29/2023)    Readmission Risk Interventions    05/29/2023   11:19 AM  Readmission Risk Prevention Plan  Transportation Screening Complete  PCP or Specialist Appt within 3-5 Days Complete  HRI or Home Care Consult Complete  Social Work Consult for Recovery Care Planning/Counseling Complete  Palliative Care Screening Not Applicable  Medication Review Oceanographer) Not Complete  Med Review Comments Patient will discuss with nurse and MD upon discharge

## 2023-06-04 NOTE — Plan of Care (Signed)

## 2023-06-05 ENCOUNTER — Inpatient Hospital Stay: Payer: Medicare HMO

## 2023-06-05 DIAGNOSIS — D509 Iron deficiency anemia, unspecified: Secondary | ICD-10-CM

## 2023-06-05 DIAGNOSIS — K921 Melena: Secondary | ICD-10-CM

## 2023-06-05 DIAGNOSIS — I214 Non-ST elevation (NSTEMI) myocardial infarction: Secondary | ICD-10-CM | POA: Diagnosis not present

## 2023-06-05 LAB — BASIC METABOLIC PANEL
Anion gap: 9 (ref 5–15)
BUN: 34 mg/dL — ABNORMAL HIGH (ref 8–23)
CO2: 30 mmol/L (ref 22–32)
Calcium: 8.5 mg/dL — ABNORMAL LOW (ref 8.9–10.3)
Chloride: 97 mmol/L — ABNORMAL LOW (ref 98–111)
Creatinine, Ser: 1.35 mg/dL — ABNORMAL HIGH (ref 0.44–1.00)
GFR, Estimated: 38 mL/min — ABNORMAL LOW (ref >60)
Glucose, Bld: 128 mg/dL — ABNORMAL HIGH (ref 70–99)
Potassium: 4.7 mmol/L (ref 3.5–5.1)
Sodium: 136 mmol/L (ref 135–145)

## 2023-06-05 LAB — CBC
HCT: 28.7 % — ABNORMAL LOW (ref 36.0–46.0)
Hemoglobin: 8.5 g/dL — ABNORMAL LOW (ref 12.0–15.0)
MCH: 24.3 pg — ABNORMAL LOW (ref 26.0–34.0)
MCHC: 29.6 g/dL — ABNORMAL LOW (ref 30.0–36.0)
MCV: 82 fL (ref 80.0–100.0)
Platelets: 255 10*3/uL (ref 150–400)
RBC: 3.5 MIL/uL — ABNORMAL LOW (ref 3.87–5.11)
RDW: 21.2 % — ABNORMAL HIGH (ref 11.5–15.5)
WBC: 8.3 10*3/uL (ref 4.0–10.5)
nRBC: 0 % (ref 0.0–0.2)

## 2023-06-05 MED ORDER — SODIUM CHLORIDE 0.9 % IV SOLN: INTRAVENOUS | Status: DC

## 2023-06-05 NOTE — Progress Notes (Signed)
Occupational Therapy Treatment Patient Details Name: Kelly Ritter MRN: [id number] DOB: 1934-01-08 Today's Date: 06/05/2023   History of present illness 89 year old Caucasian female with history sniffing for osteoarthritis, anxiety, CAD, GERD, hypertension, hyperlipidemia, CVA who presents to the ED with acute onset of generalized weakness and dizziness with subsequent fall. Patient also had elevation of troponin, suspect supply/demand ischemia. 05/28/24 Xray Pelvis: Minimally displaced fracture of the right inferior pubic ramus.  Probable nondisplaced fracture of the right superior pubic ramus adjacent to the pubic symphysis.   OT comments  Pt seen for OT tx. Pt pleasant and agreeable to session. RN notified of pt's request for pain meds ahead of mobility. Pt required only supervision for bed mobility but required increased time, effort, use of bed rails, and HOB elevated to complete. Once EOB, pt set up for seated grooming tasks. Pt stood with CGA-MIN A with VC for hand placement and tolerated ambulating in room to recliner (~15') with SBA-CGA. When asked how her pain was with exertional activity pt stated, "I just try not to think about it." Pt pleased with progress, continues to benefit from skilled OT services.       If plan is discharge home, recommend the following:  Assist for transportation;Assistance with cooking/housework;A lot of help with bathing/dressing/bathroom;A little help with walking and/or transfers;Help with stairs or ramp for entrance   Equipment Recommendations  BSC/3in1    Recommendations for Other Services      Precautions / Restrictions Precautions Precautions: Fall Restrictions Weight Bearing Restrictions: Yes RLE Weight Bearing: Weight bearing as tolerated       Mobility Bed Mobility Overal bed mobility: Needs Assistance Bed Mobility: Supine to Sit     Supine to sit: Supervision, HOB elevated, Used rails     General bed mobility comments: increased  effort, time to complete but no direct assist required    Transfers Overall transfer level: Needs assistance Equipment used: Rolling walker (2 wheels) Transfers: Sit to/from Stand Sit to Stand: Contact guard assist           General transfer comment: VC for hand placement     Balance Overall balance assessment: Needs assistance Sitting-balance support: No upper extremity supported, Feet supported Sitting balance-Leahy Scale: Good     Standing balance support: Bilateral upper extremity supported, Reliant on assistive device for balance, During functional activity Standing balance-Leahy Scale: Good                             ADL either performed or assessed with clinical judgement   ADL Overall ADL's : Needs assistance/impaired     Grooming: Sitting;Brushing hair;Wash/dry face                                      Extremity/Trunk Assessment              Vision       Perception     Praxis      Cognition Arousal: Alert Behavior During Therapy: WFL for tasks assessed/performed Overall Cognitive Status: Within Functional Limits for tasks assessed                                          Exercises Other Exercises Other Exercises: Pt educated in falls prevention, home/routines modifications  and positioning to prevent exacerbation of pelvic pain    Shoulder Instructions       General Comments      Pertinent Vitals/ Pain       Pain Assessment Pain Assessment: Faces Faces Pain Scale: Hurts little more Pain Location: R flank, pelvic area with transfers but pt states "I try not to think about it" Pain Descriptors / Indicators: Grimacing Pain Intervention(s): Monitored during session, Repositioned, Premedicated before session  Home Living                                          Prior Functioning/Environment              Frequency  Min 1X/week        Progress Toward Goals  OT  Goals(current goals can now be found in the care plan section)  Progress towards OT goals: Progressing toward goals  Acute Rehab OT Goals Patient Stated Goal: to go home OT Goal Formulation: With patient/family Time For Goal Achievement: 06/13/23 Potential to Achieve Goals: Good  Plan      Co-evaluation                 AM-PAC OT "6 Clicks" Daily Activity     Outcome Measure   Help from another person eating meals?: None Help from another person taking care of personal grooming?: A Little Help from another person toileting, which includes using toliet, bedpan, or urinal?: A Little Help from another person bathing (including washing, rinsing, drying)?: A Lot Help from another person to put on and taking off regular upper body clothing?: A Little Help from another person to put on and taking off regular lower body clothing?: A Lot 6 Click Score: 17    End of Session Equipment Utilized During Treatment: Rolling walker (2 wheels)  OT Visit Diagnosis: Other abnormalities of gait and mobility (R26.89);Muscle weakness (generalized) (M62.81)   Activity Tolerance Patient tolerated treatment well   Patient Left in chair;with call bell/phone within reach;with chair alarm set   Nurse Communication Mobility status;Patient requests pain meds        Time: 1152-1212 OT Time Calculation (min): 20 min  Charges: OT General Charges $OT Visit: 1 Visit OT Treatments $Self Care/Home Management : 8-22 mins  Arman Filter., MPH, MS, OTR/L ascom [phone] 06/05/23, 3:21 PM
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
## 2023-06-05 NOTE — TOC Progression Note (Addendum)
Transition of Care [facility]) - Progression Note    Patient Details  Name: Kelly Ritter MRN: [id number] Date of Birth: [birth year]  [facility]) CM/SW Contact  Truddie Hidden, RN Phone Number: 06/05/2023, 9:52 AM  Clinical Narrative:    Awaiting auth for [facility].  3:10pm Per Tammy in admissions at [facility], patient is approved.  MD notified.   3:54pm Patient's daughter notified of authorization for SNF and likely discharge tomorrow.      Expected Discharge Plan: Home w Home Health Services Barriers to Discharge: Continued Medical Work up  Expected Discharge Plan and Services       Living arrangements for the past 2 months: Single Family Home                                       Social Determinants of Health (SDOH) Interventions SDOH Screenings   Food Insecurity: No Food Insecurity (05/29/2023)  Housing: Low Risk  (05/29/2023)  Transportation Needs: No Transportation Needs (05/29/2023)  Utilities: Not At Risk (05/29/2023)  Alcohol Screen: Low Risk  (03/22/2020)  Depression (PHQ2-9): Medium Risk (03/22/2020)  Financial Resource Strain: Low Risk  (02/26/2022)   Received from [facility], [facility]  Physical Activity: Inactive (03/22/2020)  Social Connections: Unknown (03/22/2020)  Stress: No Stress Concern Present (03/22/2020)  Tobacco Use: Medium Risk (05/29/2023)    Readmission Risk Interventions    05/29/2023   11:19 AM  Readmission Risk Prevention Plan  Transportation Screening Complete  PCP or Specialist Appt within 3-5 Days Complete  HRI or Home Care Consult Complete  Social Work Consult for Recovery Care Planning/Counseling Complete  Palliative Care Screening Not Applicable  Medication Review Oceanographer) Not Complete  Med Review Comments Patient will discuss with nurse and MD upon discharge

## 2023-06-05 NOTE — Progress Notes (Signed)
SARS-CoV-2 infection and should not be used as the sole basis for treatment or other patient management decisions.  A negative result may occur with improper specimen collection / handling, submission of specimen other than nasopharyngeal swab, presence of viral mutation(s) within the areas targeted by this assay, and inadequate number of viral copies (<250 copies / mL). A negative result must be combined with clinical observations, patient history, and epidemiological information.  Fact Sheet for Patients:   RoadLapTop.co.za  Fact Sheet for Healthcare  Providers: http://kim-miller.com/  This test is not yet approved or  cleared by the [place] FDA and has been authorized for detection and/or diagnosis of SARS-CoV-2 by FDA under an Emergency Use Authorization (EUA).  This EUA will remain in effect (meaning this test can be used) for the duration of the COVID-19 declaration under Section 564(b)(1) of the Act, 21 U.S.C. section 360bbb-3(b)(1), unless the authorization is terminated or revoked sooner.  Performed at [facility], [address], [place], [place] [address]          Radiology Studies: MR LUMBAR SPINE WO CONTRAST  Result Date: 06/05/2023 CLINICAL DATA:  Weakness.  Fall. EXAM: MRI LUMBAR SPINE WITHOUT CONTRAST TECHNIQUE: Multiplanar, multisequence MR imaging of the lumbar spine was performed. No intravenous contrast was administered. COMPARISON:  Lumbar spine radiographs 06/03/2023. FINDINGS: Segmentation: Conventional numbering is assumed with 5 non-rib-bearing, lumbar type vertebral bodies. Alignment:  Unchanged grade 1 anterolisthesis of L4 on L5. Vertebrae: Modic type 2 degenerative endplate marrow signal changes at L4-5. Conus medullaris and cauda equina: Conus extends to the L2 level. Conus and cauda equina appear normal. Paraspinal and other soft tissues: Moderate fatty atrophy of the paraspinal muscles. Disc levels: T12-L1: No disc herniation, spinal canal stenosis or neural foraminal narrowing. Mild bilateral facet arthropathy. L1-L2: Small disc bulge without spinal canal stenosis or neural foraminal narrowing. L2-L3:  Disc bulge results in mild spinal canal stenosis. L3-L4: Disc bulge and facet arthropathy results in moderate spinal canal stenosis. L4-L5: Anterolisthesis with uncovered disc and moderate bilateral facet arthropathy with periarticular edema results in mild spinal canal stenosis, severe left and moderate right neural foraminal narrowing. L5-S1: Right eccentric disc bulge and  facet arthropathy results in compression of the traversing right S1 nerve root in the subarticular zone. IMPRESSION: 1. Multilevel lumbar spondylosis, worst at L3-L4, where there is moderate spinal canal stenosis. 2. At L4-L5, there is mild spinal canal stenosis, severe left and moderate right neural foraminal narrowing. 3. At L5-S1, there is compression of the traversing right S1 nerve root in the subarticular zone. Electronically Signed   By: Orvan Falconer M.D.   On: 06/05/2023 08:12   DG Orbits  Result Date: 06/04/2023 CLINICAL DATA:  MRI clearance EXAM: ORBITS - COMPLETE 4+ VIEW COMPARISON:  None Available. FINDINGS: No metallic foreign body of the orbits IMPRESSION: No metallic foreign body of the orbits. Electronically Signed   By: Deatra Robinson M.D.   On: 06/04/2023 23:50   ECHOCARDIOGRAM COMPLETE  Result Date: 06/04/2023    ECHOCARDIOGRAM REPORT   Patient Name:   Kelly Ritter Date of Exam: 06/04/2023 Medical Rec #:  [id number]       Height:       64.5 in Accession #:    [id number]      Weight:       182.5 lb Date of Birth:  1933-10-13        BSA:          1.892 m Patient Age:  SARS-CoV-2 infection and should not be used as the sole basis for treatment or other patient management decisions.  A negative result may occur with improper specimen collection / handling, submission of specimen other than nasopharyngeal swab, presence of viral mutation(s) within the areas targeted by this assay, and inadequate number of viral copies (<250 copies / mL). A negative result must be combined with clinical observations, patient history, and epidemiological information.  Fact Sheet for Patients:   RoadLapTop.co.za  Fact Sheet for Healthcare  Providers: http://kim-miller.com/  This test is not yet approved or  cleared by the [place] FDA and has been authorized for detection and/or diagnosis of SARS-CoV-2 by FDA under an Emergency Use Authorization (EUA).  This EUA will remain in effect (meaning this test can be used) for the duration of the COVID-19 declaration under Section 564(b)(1) of the Act, 21 U.S.C. section 360bbb-3(b)(1), unless the authorization is terminated or revoked sooner.  Performed at [facility], [address], [place], [place] [address]          Radiology Studies: MR LUMBAR SPINE WO CONTRAST  Result Date: 06/05/2023 CLINICAL DATA:  Weakness.  Fall. EXAM: MRI LUMBAR SPINE WITHOUT CONTRAST TECHNIQUE: Multiplanar, multisequence MR imaging of the lumbar spine was performed. No intravenous contrast was administered. COMPARISON:  Lumbar spine radiographs 06/03/2023. FINDINGS: Segmentation: Conventional numbering is assumed with 5 non-rib-bearing, lumbar type vertebral bodies. Alignment:  Unchanged grade 1 anterolisthesis of L4 on L5. Vertebrae: Modic type 2 degenerative endplate marrow signal changes at L4-5. Conus medullaris and cauda equina: Conus extends to the L2 level. Conus and cauda equina appear normal. Paraspinal and other soft tissues: Moderate fatty atrophy of the paraspinal muscles. Disc levels: T12-L1: No disc herniation, spinal canal stenosis or neural foraminal narrowing. Mild bilateral facet arthropathy. L1-L2: Small disc bulge without spinal canal stenosis or neural foraminal narrowing. L2-L3:  Disc bulge results in mild spinal canal stenosis. L3-L4: Disc bulge and facet arthropathy results in moderate spinal canal stenosis. L4-L5: Anterolisthesis with uncovered disc and moderate bilateral facet arthropathy with periarticular edema results in mild spinal canal stenosis, severe left and moderate right neural foraminal narrowing. L5-S1: Right eccentric disc bulge and  facet arthropathy results in compression of the traversing right S1 nerve root in the subarticular zone. IMPRESSION: 1. Multilevel lumbar spondylosis, worst at L3-L4, where there is moderate spinal canal stenosis. 2. At L4-L5, there is mild spinal canal stenosis, severe left and moderate right neural foraminal narrowing. 3. At L5-S1, there is compression of the traversing right S1 nerve root in the subarticular zone. Electronically Signed   By: Orvan Falconer M.D.   On: 06/05/2023 08:12   DG Orbits  Result Date: 06/04/2023 CLINICAL DATA:  MRI clearance EXAM: ORBITS - COMPLETE 4+ VIEW COMPARISON:  None Available. FINDINGS: No metallic foreign body of the orbits IMPRESSION: No metallic foreign body of the orbits. Electronically Signed   By: Deatra Robinson M.D.   On: 06/04/2023 23:50   ECHOCARDIOGRAM COMPLETE  Result Date: 06/04/2023    ECHOCARDIOGRAM REPORT   Patient Name:   Kelly Ritter Date of Exam: 06/04/2023 Medical Rec #:  [id number]       Height:       64.5 in Accession #:    [id number]      Weight:       182.5 lb Date of Birth:  1933-10-13        BSA:          1.892 m Patient Age:  PROGRESS NOTE    Kelly Ritter  UJW:[id number] DOB: 1933/08/28 DOA: 05/29/2023 PCP: Norval Morton, MD    Brief Narrative:   89 year old Caucasian female with history sniffing for osteoarthritis, anxiety, CAD, GERD, hypertension, hyperlipidemia, CVA who presents to the ED with acute onset of generalized weakness and dizziness with subsequent fall.  Per patient she has been feeling increasingly weak however did not report any deterioration in her oral intake or fluid intake.   She presented after mechanical fall.  At this time unable to exclude syncopal event.  Was associated with generalized weakness and dizziness.  Unfortunately fall resulted in right inferior pubic ramus fracture.   Patient also had elevation of troponin concerning for NSTEMI.  Initially started on heparin gtt.  Cardiology consulted.  Stopped heparin as no plans for ischemic evaluation and patient receiving blood transfusion.  Patient has acute on chronic anemia and acute kidney injury.  Etiologies are somewhat unclear.  Will hydrate and transfuse and monitor as appropriate.  10/18: Creatinine improving.  Hb stable 10/19: Creatinine essentially normalized.  Hemoglobin remained stable. 10/20: Medically stable for discharge to skilled nursing facility.  Pending placement. 10/22: Patient expresses desire to go home however discussed with therapy and patient is a max assist.  Recommendation remains for STR.   Assessment & Plan:   Principal Problem:   NSTEMI (non-ST elevated myocardial infarction) (HCC) Active Problems:   AKI (acute kidney injury) (HCC)   Fall at home, initial encounter   Dyslipidemia   Elevated transaminase level   Dementia with behavioral disturbance (HCC)   GERD without esophagitis   Hypothyroidism   Closed fracture of right inferior pubic ramus (HCC)   Mitral regurgitation  Elevated troponin Mitral regurgitation NSTEMI ruled out.  Troponins peaked at 300.  Subsequently downtrending.   Suspect supply/demand ischemia.  Seen by cardiology.  Heparin gtt. stopped.  No indication for ischemic evaluation. TTE remarkable only for moderate MR - monitor  AKI Suspect prerenal azotemia in the setting of poor p.o. intake and poor hydration. Resolved yesterday, mild aki today - push hydration, if continues to up-trend may need another round of fluids  Acute on chronic iron deficiency anemia No signs of acute blood loss.  Patient's hemoglobin 6.9 here.  Microcytosis.  Iron indices indicative of iron deficiency anemia. Received 1 unit. Patient endorses black stools. Hgb stable post-transfusion in the 8s - continue ppi - GI eval pending, they are considering EGD  Mechanical fall Generalized weakness Right inferior pubic ramus fracture As needed pain control PT and OT consults, recommend SNF Pending insurance auth  Lumbar stenosis Radiculopathy History L45 discectomy in 2022. Now with worsening lumbar back pain. MRI is motion degraded showing mod spinal stenosis, s1 nerve compression, no cord compression  - PT - pain control - daughter suggests steroid burst will discuss with patient  CAD With prior stents. No chest pain currently. Benign TTE this hospitalization - holding asa pending anemia eval  Hyperlipidemia Statin  Hypothyroidism Synthroid  GERD PPI  Dementia without behavioral disturbance Aricept   DVT prophylaxis: scds Code Status: DNR Family Communication: Daughter updated telephonically 10/23 Disposition Plan: Status is: Inpatient Remains inpatient appropriate because: Unsafe discharge plan.  Need skilled nursing facility.   Level of care: Telemetry Cardiac  Consultants:  None  Procedures:  None  Antimicrobials: None   Subjective: Seen and examined. Ongoing low back pain. No dyspnea. Tolerating diet  Objective: Vitals:   06/04/23 2344 06/05/23 0359 06/05/23 0841 06/05/23 1306  BP: (!) 141/53 Marland Kitchen)  SARS-CoV-2 infection and should not be used as the sole basis for treatment or other patient management decisions.  A negative result may occur with improper specimen collection / handling, submission of specimen other than nasopharyngeal swab, presence of viral mutation(s) within the areas targeted by this assay, and inadequate number of viral copies (<250 copies / mL). A negative result must be combined with clinical observations, patient history, and epidemiological information.  Fact Sheet for Patients:   RoadLapTop.co.za  Fact Sheet for Healthcare  Providers: http://kim-miller.com/  This test is not yet approved or  cleared by the [place] FDA and has been authorized for detection and/or diagnosis of SARS-CoV-2 by FDA under an Emergency Use Authorization (EUA).  This EUA will remain in effect (meaning this test can be used) for the duration of the COVID-19 declaration under Section 564(b)(1) of the Act, 21 U.S.C. section 360bbb-3(b)(1), unless the authorization is terminated or revoked sooner.  Performed at [facility], [address], [place], [place] [address]          Radiology Studies: MR LUMBAR SPINE WO CONTRAST  Result Date: 06/05/2023 CLINICAL DATA:  Weakness.  Fall. EXAM: MRI LUMBAR SPINE WITHOUT CONTRAST TECHNIQUE: Multiplanar, multisequence MR imaging of the lumbar spine was performed. No intravenous contrast was administered. COMPARISON:  Lumbar spine radiographs 06/03/2023. FINDINGS: Segmentation: Conventional numbering is assumed with 5 non-rib-bearing, lumbar type vertebral bodies. Alignment:  Unchanged grade 1 anterolisthesis of L4 on L5. Vertebrae: Modic type 2 degenerative endplate marrow signal changes at L4-5. Conus medullaris and cauda equina: Conus extends to the L2 level. Conus and cauda equina appear normal. Paraspinal and other soft tissues: Moderate fatty atrophy of the paraspinal muscles. Disc levels: T12-L1: No disc herniation, spinal canal stenosis or neural foraminal narrowing. Mild bilateral facet arthropathy. L1-L2: Small disc bulge without spinal canal stenosis or neural foraminal narrowing. L2-L3:  Disc bulge results in mild spinal canal stenosis. L3-L4: Disc bulge and facet arthropathy results in moderate spinal canal stenosis. L4-L5: Anterolisthesis with uncovered disc and moderate bilateral facet arthropathy with periarticular edema results in mild spinal canal stenosis, severe left and moderate right neural foraminal narrowing. L5-S1: Right eccentric disc bulge and  facet arthropathy results in compression of the traversing right S1 nerve root in the subarticular zone. IMPRESSION: 1. Multilevel lumbar spondylosis, worst at L3-L4, where there is moderate spinal canal stenosis. 2. At L4-L5, there is mild spinal canal stenosis, severe left and moderate right neural foraminal narrowing. 3. At L5-S1, there is compression of the traversing right S1 nerve root in the subarticular zone. Electronically Signed   By: Orvan Falconer M.D.   On: 06/05/2023 08:12   DG Orbits  Result Date: 06/04/2023 CLINICAL DATA:  MRI clearance EXAM: ORBITS - COMPLETE 4+ VIEW COMPARISON:  None Available. FINDINGS: No metallic foreign body of the orbits IMPRESSION: No metallic foreign body of the orbits. Electronically Signed   By: Deatra Robinson M.D.   On: 06/04/2023 23:50   ECHOCARDIOGRAM COMPLETE  Result Date: 06/04/2023    ECHOCARDIOGRAM REPORT   Patient Name:   Kelly Ritter Date of Exam: 06/04/2023 Medical Rec #:  [id number]       Height:       64.5 in Accession #:    [id number]      Weight:       182.5 lb Date of Birth:  1933-10-13        BSA:          1.892 m Patient Age:  PROGRESS NOTE    Kelly Ritter  UJW:[id number] DOB: 1933/08/28 DOA: 05/29/2023 PCP: Norval Morton, MD    Brief Narrative:   89 year old Caucasian female with history sniffing for osteoarthritis, anxiety, CAD, GERD, hypertension, hyperlipidemia, CVA who presents to the ED with acute onset of generalized weakness and dizziness with subsequent fall.  Per patient she has been feeling increasingly weak however did not report any deterioration in her oral intake or fluid intake.   She presented after mechanical fall.  At this time unable to exclude syncopal event.  Was associated with generalized weakness and dizziness.  Unfortunately fall resulted in right inferior pubic ramus fracture.   Patient also had elevation of troponin concerning for NSTEMI.  Initially started on heparin gtt.  Cardiology consulted.  Stopped heparin as no plans for ischemic evaluation and patient receiving blood transfusion.  Patient has acute on chronic anemia and acute kidney injury.  Etiologies are somewhat unclear.  Will hydrate and transfuse and monitor as appropriate.  10/18: Creatinine improving.  Hb stable 10/19: Creatinine essentially normalized.  Hemoglobin remained stable. 10/20: Medically stable for discharge to skilled nursing facility.  Pending placement. 10/22: Patient expresses desire to go home however discussed with therapy and patient is a max assist.  Recommendation remains for STR.   Assessment & Plan:   Principal Problem:   NSTEMI (non-ST elevated myocardial infarction) (HCC) Active Problems:   AKI (acute kidney injury) (HCC)   Fall at home, initial encounter   Dyslipidemia   Elevated transaminase level   Dementia with behavioral disturbance (HCC)   GERD without esophagitis   Hypothyroidism   Closed fracture of right inferior pubic ramus (HCC)   Mitral regurgitation  Elevated troponin Mitral regurgitation NSTEMI ruled out.  Troponins peaked at 300.  Subsequently downtrending.   Suspect supply/demand ischemia.  Seen by cardiology.  Heparin gtt. stopped.  No indication for ischemic evaluation. TTE remarkable only for moderate MR - monitor  AKI Suspect prerenal azotemia in the setting of poor p.o. intake and poor hydration. Resolved yesterday, mild aki today - push hydration, if continues to up-trend may need another round of fluids  Acute on chronic iron deficiency anemia No signs of acute blood loss.  Patient's hemoglobin 6.9 here.  Microcytosis.  Iron indices indicative of iron deficiency anemia. Received 1 unit. Patient endorses black stools. Hgb stable post-transfusion in the 8s - continue ppi - GI eval pending, they are considering EGD  Mechanical fall Generalized weakness Right inferior pubic ramus fracture As needed pain control PT and OT consults, recommend SNF Pending insurance auth  Lumbar stenosis Radiculopathy History L45 discectomy in 2022. Now with worsening lumbar back pain. MRI is motion degraded showing mod spinal stenosis, s1 nerve compression, no cord compression  - PT - pain control - daughter suggests steroid burst will discuss with patient  CAD With prior stents. No chest pain currently. Benign TTE this hospitalization - holding asa pending anemia eval  Hyperlipidemia Statin  Hypothyroidism Synthroid  GERD PPI  Dementia without behavioral disturbance Aricept   DVT prophylaxis: scds Code Status: DNR Family Communication: Daughter updated telephonically 10/23 Disposition Plan: Status is: Inpatient Remains inpatient appropriate because: Unsafe discharge plan.  Need skilled nursing facility.   Level of care: Telemetry Cardiac  Consultants:  None  Procedures:  None  Antimicrobials: None   Subjective: Seen and examined. Ongoing low back pain. No dyspnea. Tolerating diet  Objective: Vitals:   06/04/23 2344 06/05/23 0359 06/05/23 0841 06/05/23 1306  BP: (!) 141/53 Marland Kitchen)  PROGRESS NOTE    Kelly Ritter  UJW:[id number] DOB: 1933/08/28 DOA: 05/29/2023 PCP: Norval Morton, MD    Brief Narrative:   89 year old Caucasian female with history sniffing for osteoarthritis, anxiety, CAD, GERD, hypertension, hyperlipidemia, CVA who presents to the ED with acute onset of generalized weakness and dizziness with subsequent fall.  Per patient she has been feeling increasingly weak however did not report any deterioration in her oral intake or fluid intake.   She presented after mechanical fall.  At this time unable to exclude syncopal event.  Was associated with generalized weakness and dizziness.  Unfortunately fall resulted in right inferior pubic ramus fracture.   Patient also had elevation of troponin concerning for NSTEMI.  Initially started on heparin gtt.  Cardiology consulted.  Stopped heparin as no plans for ischemic evaluation and patient receiving blood transfusion.  Patient has acute on chronic anemia and acute kidney injury.  Etiologies are somewhat unclear.  Will hydrate and transfuse and monitor as appropriate.  10/18: Creatinine improving.  Hb stable 10/19: Creatinine essentially normalized.  Hemoglobin remained stable. 10/20: Medically stable for discharge to skilled nursing facility.  Pending placement. 10/22: Patient expresses desire to go home however discussed with therapy and patient is a max assist.  Recommendation remains for STR.   Assessment & Plan:   Principal Problem:   NSTEMI (non-ST elevated myocardial infarction) (HCC) Active Problems:   AKI (acute kidney injury) (HCC)   Fall at home, initial encounter   Dyslipidemia   Elevated transaminase level   Dementia with behavioral disturbance (HCC)   GERD without esophagitis   Hypothyroidism   Closed fracture of right inferior pubic ramus (HCC)   Mitral regurgitation  Elevated troponin Mitral regurgitation NSTEMI ruled out.  Troponins peaked at 300.  Subsequently downtrending.   Suspect supply/demand ischemia.  Seen by cardiology.  Heparin gtt. stopped.  No indication for ischemic evaluation. TTE remarkable only for moderate MR - monitor  AKI Suspect prerenal azotemia in the setting of poor p.o. intake and poor hydration. Resolved yesterday, mild aki today - push hydration, if continues to up-trend may need another round of fluids  Acute on chronic iron deficiency anemia No signs of acute blood loss.  Patient's hemoglobin 6.9 here.  Microcytosis.  Iron indices indicative of iron deficiency anemia. Received 1 unit. Patient endorses black stools. Hgb stable post-transfusion in the 8s - continue ppi - GI eval pending, they are considering EGD  Mechanical fall Generalized weakness Right inferior pubic ramus fracture As needed pain control PT and OT consults, recommend SNF Pending insurance auth  Lumbar stenosis Radiculopathy History L45 discectomy in 2022. Now with worsening lumbar back pain. MRI is motion degraded showing mod spinal stenosis, s1 nerve compression, no cord compression  - PT - pain control - daughter suggests steroid burst will discuss with patient  CAD With prior stents. No chest pain currently. Benign TTE this hospitalization - holding asa pending anemia eval  Hyperlipidemia Statin  Hypothyroidism Synthroid  GERD PPI  Dementia without behavioral disturbance Aricept   DVT prophylaxis: scds Code Status: DNR Family Communication: Daughter updated telephonically 10/23 Disposition Plan: Status is: Inpatient Remains inpatient appropriate because: Unsafe discharge plan.  Need skilled nursing facility.   Level of care: Telemetry Cardiac  Consultants:  None  Procedures:  None  Antimicrobials: None   Subjective: Seen and examined. Ongoing low back pain. No dyspnea. Tolerating diet  Objective: Vitals:   06/04/23 2344 06/05/23 0359 06/05/23 0841 06/05/23 1306  BP: (!) 141/53 Marland Kitchen)

## 2023-06-05 NOTE — Plan of Care (Signed)
  Problem: Activity: Goal: Ability to tolerate increased activity will improve Outcome: Progressing   Problem: Cardiac: Goal: Ability to achieve and maintain adequate cardiovascular perfusion will improve Outcome: Progressing   Problem: Pain Managment: Goal: General experience of comfort will improve Outcome: Progressing   Problem: Safety: Goal: Ability to remain free from injury will improve Outcome: Progressing

## 2023-06-06 ENCOUNTER — Encounter
Admission: EM | Disposition: A | Discharge status: Skilled Nursing Facility | Payer: Self-pay | Source: Home / Self Care | Attending: Internal Medicine

## 2023-06-06 ENCOUNTER — Inpatient Hospital Stay: Payer: Medicare HMO | Admitting: Anesthesiology

## 2023-06-06 ENCOUNTER — Inpatient Hospital Stay: Payer: Medicare HMO

## 2023-06-06 ENCOUNTER — Other Ambulatory Visit: Payer: Self-pay

## 2023-06-06 DIAGNOSIS — K259 Gastric ulcer, unspecified as acute or chronic, without hemorrhage or perforation: Secondary | ICD-10-CM | POA: Insufficient documentation

## 2023-06-06 DIAGNOSIS — I214 Non-ST elevation (NSTEMI) myocardial infarction: Secondary | ICD-10-CM | POA: Diagnosis not present

## 2023-06-06 DIAGNOSIS — K921 Melena: Secondary | ICD-10-CM | POA: Diagnosis not present

## 2023-06-06 HISTORY — PX: ESOPHAGOGASTRODUODENOSCOPY (EGD) WITH PROPOFOL: SHX5813

## 2023-06-06 LAB — BASIC METABOLIC PANEL
Anion gap: 10 (ref 5–15)
BUN: 38 mg/dL — ABNORMAL HIGH (ref 8–23)
CO2: 30 mmol/L (ref 22–32)
Calcium: 8.5 mg/dL — ABNORMAL LOW (ref 8.9–10.3)
Chloride: 95 mmol/L — ABNORMAL LOW (ref 98–111)
Creatinine, Ser: 1.22 mg/dL — ABNORMAL HIGH (ref 0.44–1.00)
GFR, Estimated: 43 mL/min — ABNORMAL LOW (ref >60)
Glucose, Bld: 144 mg/dL — ABNORMAL HIGH (ref 70–99)
Potassium: 4.6 mmol/L (ref 3.5–5.1)
Sodium: 135 mmol/L (ref 135–145)

## 2023-06-06 LAB — GLUCOSE, CAPILLARY: Glucose-Capillary: 127 mg/dL — ABNORMAL HIGH (ref 70–99)

## 2023-06-06 SURGERY — ESOPHAGOGASTRODUODENOSCOPY (EGD) WITH PROPOFOL
Anesthesia: General

## 2023-06-06 MED ORDER — LIDOCAINE 5 % EX PTCH
1.0000 | MEDICATED_PATCH | CUTANEOUS | Status: DC
  Filled 2023-06-06: qty 1

## 2023-06-06 MED ORDER — LIDOCAINE HCL (CARDIAC) PF 100 MG/5ML IV SOSY
PREFILLED_SYRINGE | INTRAVENOUS | Status: DC | PRN
  Administered 2023-06-06: 100 mg via INTRAVENOUS

## 2023-06-06 MED ORDER — FUROSEMIDE 10 MG/ML IJ SOLN
40.0000 mg | Freq: Once | INTRAMUSCULAR | Status: AC
  Administered 2023-06-06: 40 mg via INTRAVENOUS
  Filled 2023-06-06: qty 4

## 2023-06-06 MED ORDER — ATORVASTATIN CALCIUM 80 MG PO TABS: 80.0000 mg | ORAL_TABLET | Freq: Every day | ORAL | Status: AC

## 2023-06-06 MED ORDER — OXYCODONE HCL 5 MG PO TABS: 5.0000 mg | ORAL_TABLET | ORAL | 0 refills | Status: DC | PRN

## 2023-06-06 MED ORDER — OMEPRAZOLE 40 MG PO CPDR: DELAYED_RELEASE_CAPSULE | ORAL | Status: AC

## 2023-06-06 MED ORDER — OXYCODONE HCL 5 MG PO TABS: 5.0000 mg | ORAL_TABLET | ORAL | 0 refills | Status: AC | PRN

## 2023-06-06 MED ORDER — GLYCOPYRROLATE 0.2 MG/ML IJ SOLN
INTRAMUSCULAR | Status: DC | PRN
  Administered 2023-06-06: .2 mg via INTRAVENOUS

## 2023-06-06 MED ORDER — PROPOFOL 10 MG/ML IV BOLUS
INTRAVENOUS | Status: DC | PRN
  Administered 2023-06-06: 50 mg via INTRAVENOUS

## 2023-06-06 NOTE — Progress Notes (Addendum)
Patient Name:   Kelly Ritter Date of Exam: 06/04/2023 Medical Rec #:  [id number]       Height:       64.5 in Accession #:    [id number]      Weight:       182.5 lb Date of Birth:  14-Apr-1934        BSA:          1.892 m Patient Age:    89 years        BP:           137/42 mmHg Patient Gender: F               HR:           53 bpm. Exam Location:  [facility] Procedure: 2D Echo, Cardiac Doppler and Color Doppler Indications:     CHF-acute diastolic I50.31  History:         Patient has prior history of Echocardiogram examinations, most                   recent 12/25/2020. CAD and Previous Myocardial Infarction; Risk                  Factors:Hypertension.  Sonographer:     Cristela Blue Referring Phys:  [id number] Tresa Moore Diagnosing Phys: Marcina Millard MD IMPRESSIONS  1. Left ventricular ejection fraction, by estimation, is 60 to 65%. The left ventricle has normal function. The left ventricle has no regional wall motion abnormalities. Left ventricular diastolic parameters were normal.  2. Right ventricular systolic function is normal. The right ventricular size is normal.  3. Left atrial size was mildly dilated.  4. Right atrial size was moderately dilated.  5. The mitral valve is normal in structure. Moderate mitral valve regurgitation. No evidence of mitral stenosis.  6. The aortic valve is normal in structure. Aortic valve regurgitation is mild. Mild aortic valve stenosis.  7. The inferior vena cava is normal in size with greater than 50% respiratory variability, suggesting right atrial pressure of 3 mmHg. FINDINGS  Left Ventricle: Left ventricular ejection fraction, by estimation, is 60 to 65%. The left ventricle has normal function. The left ventricle has no regional wall motion abnormalities. The left ventricular internal cavity size was normal in size. There is  no left ventricular hypertrophy. Left ventricular diastolic parameters were normal. Right Ventricle: The right ventricular size is normal. No increase in right ventricular wall thickness. Right ventricular systolic function is normal. Left Atrium: Left atrial size was mildly dilated. Right Atrium: Right atrial size was moderately dilated. Pericardium: There is no evidence of pericardial effusion. Mitral Valve: The mitral valve is normal in structure. There is moderate thickening of the mitral valve leaflet(s). Moderate mitral valve regurgitation. No evidence of mitral valve stenosis. Tricuspid Valve: The tricuspid valve is normal in structure. Tricuspid valve regurgitation is mild . No  evidence of tricuspid stenosis. Aortic Valve: The aortic valve is normal in structure. Aortic valve regurgitation is mild. Mild aortic stenosis is present. Aortic valve mean gradient measures 10.7 mmHg. Aortic valve peak gradient measures 17.9 mmHg. Aortic valve area, by VTI measures 1.83 cm. Pulmonic Valve: The pulmonic valve was normal in structure. Pulmonic valve regurgitation is not visualized. No evidence of pulmonic stenosis. Aorta: The aortic root is normal in size and structure. Venous: The inferior vena cava is normal in size with greater than 50% respiratory variability, suggesting right atrial pressure of 3 mmHg. IAS/Shunts: No atrial level  Patient Name:   Kelly Ritter Date of Exam: 06/04/2023 Medical Rec #:  [id number]       Height:       64.5 in Accession #:    [id number]      Weight:       182.5 lb Date of Birth:  14-Apr-1934        BSA:          1.892 m Patient Age:    89 years        BP:           137/42 mmHg Patient Gender: F               HR:           53 bpm. Exam Location:  [facility] Procedure: 2D Echo, Cardiac Doppler and Color Doppler Indications:     CHF-acute diastolic I50.31  History:         Patient has prior history of Echocardiogram examinations, most                   recent 12/25/2020. CAD and Previous Myocardial Infarction; Risk                  Factors:Hypertension.  Sonographer:     Cristela Blue Referring Phys:  [id number] Tresa Moore Diagnosing Phys: Marcina Millard MD IMPRESSIONS  1. Left ventricular ejection fraction, by estimation, is 60 to 65%. The left ventricle has normal function. The left ventricle has no regional wall motion abnormalities. Left ventricular diastolic parameters were normal.  2. Right ventricular systolic function is normal. The right ventricular size is normal.  3. Left atrial size was mildly dilated.  4. Right atrial size was moderately dilated.  5. The mitral valve is normal in structure. Moderate mitral valve regurgitation. No evidence of mitral stenosis.  6. The aortic valve is normal in structure. Aortic valve regurgitation is mild. Mild aortic valve stenosis.  7. The inferior vena cava is normal in size with greater than 50% respiratory variability, suggesting right atrial pressure of 3 mmHg. FINDINGS  Left Ventricle: Left ventricular ejection fraction, by estimation, is 60 to 65%. The left ventricle has normal function. The left ventricle has no regional wall motion abnormalities. The left ventricular internal cavity size was normal in size. There is  no left ventricular hypertrophy. Left ventricular diastolic parameters were normal. Right Ventricle: The right ventricular size is normal. No increase in right ventricular wall thickness. Right ventricular systolic function is normal. Left Atrium: Left atrial size was mildly dilated. Right Atrium: Right atrial size was moderately dilated. Pericardium: There is no evidence of pericardial effusion. Mitral Valve: The mitral valve is normal in structure. There is moderate thickening of the mitral valve leaflet(s). Moderate mitral valve regurgitation. No evidence of mitral valve stenosis. Tricuspid Valve: The tricuspid valve is normal in structure. Tricuspid valve regurgitation is mild . No  evidence of tricuspid stenosis. Aortic Valve: The aortic valve is normal in structure. Aortic valve regurgitation is mild. Mild aortic stenosis is present. Aortic valve mean gradient measures 10.7 mmHg. Aortic valve peak gradient measures 17.9 mmHg. Aortic valve area, by VTI measures 1.83 cm. Pulmonic Valve: The pulmonic valve was normal in structure. Pulmonic valve regurgitation is not visualized. No evidence of pulmonic stenosis. Aorta: The aortic root is normal in size and structure. Venous: The inferior vena cava is normal in size with greater than 50% respiratory variability, suggesting right atrial pressure of 3 mmHg. IAS/Shunts: No atrial level  PROGRESS NOTE    FLORINA SCHWEERS  VFI:[id number] DOB: 12-24-1933 DOA: 05/29/2023 PCP: Norval Morton, MD    Brief Narrative:   89 year old Caucasian female with history sniffing for osteoarthritis, anxiety, CAD, GERD, hypertension, hyperlipidemia, CVA who presents to the ED with acute onset of generalized weakness and dizziness with subsequent fall.  Per patient she has been feeling increasingly weak however did not report any deterioration in her oral intake or fluid intake.   She presented after mechanical fall.  At this time unable to exclude syncopal event.  Was associated with generalized weakness and dizziness.  Unfortunately fall resulted in right inferior pubic ramus fracture.   Patient also had elevation of troponin concerning for NSTEMI.  Initially started on heparin gtt.  Cardiology consulted.  Stopped heparin as no plans for ischemic evaluation and patient receiving blood transfusion.  Patient has acute on chronic anemia and acute kidney injury.  Etiologies are somewhat unclear.  Will hydrate and transfuse and monitor as appropriate.  10/18: Creatinine improving.  Hb stable 10/19: Creatinine essentially normalized.  Hemoglobin remained stable. 10/20: Medically stable for discharge to skilled nursing facility.  Pending placement. 10/22: Patient expresses desire to go home however discussed with therapy and patient is a max assist.  Recommendation remains for STR.   Assessment & Plan:   Principal Problem:   NSTEMI (non-ST elevated myocardial infarction) (HCC) Active Problems:   AKI (acute kidney injury) (HCC)   Fall at home, initial encounter   Dyslipidemia   Elevated transaminase level   Dementia with behavioral disturbance (HCC)   GERD without esophagitis   Hypothyroidism   Closed fracture of right inferior pubic ramus (HCC)   Mitral regurgitation   Melena  Elevated troponin Mitral regurgitation CAD NSTEMI ruled out.  Troponins peaked at 300.  Subsequently  downtrending.  Suspect supply/demand ischemia.  Seen by cardiology.  Heparin gtt. stopped.  No indication for ischemic evaluation. TTE remarkable only for moderate MR. Hx prior stents - outpt cardiology f/u - holding asa pending anemia eval below  AKI Suspect prerenal azotemia in the setting of poor p.o. intake and poor hydration. Cr remains mildly up from baseline. Appears somewhat fluid overloaded as below - lasix today, f/u cr tomorrow  Acute on chronic iron deficiency anemia No signs of acute blood loss.  Patient's hemoglobin 6.9 here.  Microcytosis.  Iron indices indicative of iron deficiency anemia. Received 1 unit. Patient endorses black stools. Hgb stable post-transfusion in the 8s - continue ppi - for EGD today  Hypoxia Denies dyspnea or cough but has persistent 1-2 liter o2 requirement this hospitalization. Rales at bases, suspect atelectasis, possibly some component of fluid overload - will repeat cxr - lasix x1 as below - start IC  Elevated blood pressure Doesn't carry dx of htn. Bnp elevated and is somewhat edematous. Was on metop previously but this has been held 2/2 bradycardia - will given lasix 40 IV once - consider institution of   Mechanical fall Generalized weakness Right inferior pubic ramus fracture As needed pain control PT and OT consults, recommend SNF Has authorization, can discharge when w/u complete  Lumbar stenosis Radiculopathy History L45 discectomy in 2022. Now with worsening lumbar back pain. MRI is motion degraded showing mod spinal stenosis, s1 nerve compression, no cord compression. Daughter suggested prednisone but patient declines, doesn't want something that will affect sugars and sleep. Wants to try something topical - PT - pain control - lidocaine patch  Hyperlipidemia Statin  Hypothyroidism Synthroid  GERD PPI  PROGRESS NOTE    FLORINA SCHWEERS  VFI:[id number] DOB: 12-24-1933 DOA: 05/29/2023 PCP: Norval Morton, MD    Brief Narrative:   89 year old Caucasian female with history sniffing for osteoarthritis, anxiety, CAD, GERD, hypertension, hyperlipidemia, CVA who presents to the ED with acute onset of generalized weakness and dizziness with subsequent fall.  Per patient she has been feeling increasingly weak however did not report any deterioration in her oral intake or fluid intake.   She presented after mechanical fall.  At this time unable to exclude syncopal event.  Was associated with generalized weakness and dizziness.  Unfortunately fall resulted in right inferior pubic ramus fracture.   Patient also had elevation of troponin concerning for NSTEMI.  Initially started on heparin gtt.  Cardiology consulted.  Stopped heparin as no plans for ischemic evaluation and patient receiving blood transfusion.  Patient has acute on chronic anemia and acute kidney injury.  Etiologies are somewhat unclear.  Will hydrate and transfuse and monitor as appropriate.  10/18: Creatinine improving.  Hb stable 10/19: Creatinine essentially normalized.  Hemoglobin remained stable. 10/20: Medically stable for discharge to skilled nursing facility.  Pending placement. 10/22: Patient expresses desire to go home however discussed with therapy and patient is a max assist.  Recommendation remains for STR.   Assessment & Plan:   Principal Problem:   NSTEMI (non-ST elevated myocardial infarction) (HCC) Active Problems:   AKI (acute kidney injury) (HCC)   Fall at home, initial encounter   Dyslipidemia   Elevated transaminase level   Dementia with behavioral disturbance (HCC)   GERD without esophagitis   Hypothyroidism   Closed fracture of right inferior pubic ramus (HCC)   Mitral regurgitation   Melena  Elevated troponin Mitral regurgitation CAD NSTEMI ruled out.  Troponins peaked at 300.  Subsequently  downtrending.  Suspect supply/demand ischemia.  Seen by cardiology.  Heparin gtt. stopped.  No indication for ischemic evaluation. TTE remarkable only for moderate MR. Hx prior stents - outpt cardiology f/u - holding asa pending anemia eval below  AKI Suspect prerenal azotemia in the setting of poor p.o. intake and poor hydration. Cr remains mildly up from baseline. Appears somewhat fluid overloaded as below - lasix today, f/u cr tomorrow  Acute on chronic iron deficiency anemia No signs of acute blood loss.  Patient's hemoglobin 6.9 here.  Microcytosis.  Iron indices indicative of iron deficiency anemia. Received 1 unit. Patient endorses black stools. Hgb stable post-transfusion in the 8s - continue ppi - for EGD today  Hypoxia Denies dyspnea or cough but has persistent 1-2 liter o2 requirement this hospitalization. Rales at bases, suspect atelectasis, possibly some component of fluid overload - will repeat cxr - lasix x1 as below - start IC  Elevated blood pressure Doesn't carry dx of htn. Bnp elevated and is somewhat edematous. Was on metop previously but this has been held 2/2 bradycardia - will given lasix 40 IV once - consider institution of   Mechanical fall Generalized weakness Right inferior pubic ramus fracture As needed pain control PT and OT consults, recommend SNF Has authorization, can discharge when w/u complete  Lumbar stenosis Radiculopathy History L45 discectomy in 2022. Now with worsening lumbar back pain. MRI is motion degraded showing mod spinal stenosis, s1 nerve compression, no cord compression. Daughter suggested prednisone but patient declines, doesn't want something that will affect sugars and sleep. Wants to try something topical - PT - pain control - lidocaine patch  Hyperlipidemia Statin  Hypothyroidism Synthroid  GERD PPI  PROGRESS NOTE    FLORINA SCHWEERS  VFI:[id number] DOB: 12-24-1933 DOA: 05/29/2023 PCP: Norval Morton, MD    Brief Narrative:   89 year old Caucasian female with history sniffing for osteoarthritis, anxiety, CAD, GERD, hypertension, hyperlipidemia, CVA who presents to the ED with acute onset of generalized weakness and dizziness with subsequent fall.  Per patient she has been feeling increasingly weak however did not report any deterioration in her oral intake or fluid intake.   She presented after mechanical fall.  At this time unable to exclude syncopal event.  Was associated with generalized weakness and dizziness.  Unfortunately fall resulted in right inferior pubic ramus fracture.   Patient also had elevation of troponin concerning for NSTEMI.  Initially started on heparin gtt.  Cardiology consulted.  Stopped heparin as no plans for ischemic evaluation and patient receiving blood transfusion.  Patient has acute on chronic anemia and acute kidney injury.  Etiologies are somewhat unclear.  Will hydrate and transfuse and monitor as appropriate.  10/18: Creatinine improving.  Hb stable 10/19: Creatinine essentially normalized.  Hemoglobin remained stable. 10/20: Medically stable for discharge to skilled nursing facility.  Pending placement. 10/22: Patient expresses desire to go home however discussed with therapy and patient is a max assist.  Recommendation remains for STR.   Assessment & Plan:   Principal Problem:   NSTEMI (non-ST elevated myocardial infarction) (HCC) Active Problems:   AKI (acute kidney injury) (HCC)   Fall at home, initial encounter   Dyslipidemia   Elevated transaminase level   Dementia with behavioral disturbance (HCC)   GERD without esophagitis   Hypothyroidism   Closed fracture of right inferior pubic ramus (HCC)   Mitral regurgitation   Melena  Elevated troponin Mitral regurgitation CAD NSTEMI ruled out.  Troponins peaked at 300.  Subsequently  downtrending.  Suspect supply/demand ischemia.  Seen by cardiology.  Heparin gtt. stopped.  No indication for ischemic evaluation. TTE remarkable only for moderate MR. Hx prior stents - outpt cardiology f/u - holding asa pending anemia eval below  AKI Suspect prerenal azotemia in the setting of poor p.o. intake and poor hydration. Cr remains mildly up from baseline. Appears somewhat fluid overloaded as below - lasix today, f/u cr tomorrow  Acute on chronic iron deficiency anemia No signs of acute blood loss.  Patient's hemoglobin 6.9 here.  Microcytosis.  Iron indices indicative of iron deficiency anemia. Received 1 unit. Patient endorses black stools. Hgb stable post-transfusion in the 8s - continue ppi - for EGD today  Hypoxia Denies dyspnea or cough but has persistent 1-2 liter o2 requirement this hospitalization. Rales at bases, suspect atelectasis, possibly some component of fluid overload - will repeat cxr - lasix x1 as below - start IC  Elevated blood pressure Doesn't carry dx of htn. Bnp elevated and is somewhat edematous. Was on metop previously but this has been held 2/2 bradycardia - will given lasix 40 IV once - consider institution of   Mechanical fall Generalized weakness Right inferior pubic ramus fracture As needed pain control PT and OT consults, recommend SNF Has authorization, can discharge when w/u complete  Lumbar stenosis Radiculopathy History L45 discectomy in 2022. Now with worsening lumbar back pain. MRI is motion degraded showing mod spinal stenosis, s1 nerve compression, no cord compression. Daughter suggested prednisone but patient declines, doesn't want something that will affect sugars and sleep. Wants to try something topical - PT - pain control - lidocaine patch  Hyperlipidemia Statin  Hypothyroidism Synthroid  GERD PPI  PROGRESS NOTE    FLORINA SCHWEERS  VFI:[id number] DOB: 12-24-1933 DOA: 05/29/2023 PCP: Norval Morton, MD    Brief Narrative:   89 year old Caucasian female with history sniffing for osteoarthritis, anxiety, CAD, GERD, hypertension, hyperlipidemia, CVA who presents to the ED with acute onset of generalized weakness and dizziness with subsequent fall.  Per patient she has been feeling increasingly weak however did not report any deterioration in her oral intake or fluid intake.   She presented after mechanical fall.  At this time unable to exclude syncopal event.  Was associated with generalized weakness and dizziness.  Unfortunately fall resulted in right inferior pubic ramus fracture.   Patient also had elevation of troponin concerning for NSTEMI.  Initially started on heparin gtt.  Cardiology consulted.  Stopped heparin as no plans for ischemic evaluation and patient receiving blood transfusion.  Patient has acute on chronic anemia and acute kidney injury.  Etiologies are somewhat unclear.  Will hydrate and transfuse and monitor as appropriate.  10/18: Creatinine improving.  Hb stable 10/19: Creatinine essentially normalized.  Hemoglobin remained stable. 10/20: Medically stable for discharge to skilled nursing facility.  Pending placement. 10/22: Patient expresses desire to go home however discussed with therapy and patient is a max assist.  Recommendation remains for STR.   Assessment & Plan:   Principal Problem:   NSTEMI (non-ST elevated myocardial infarction) (HCC) Active Problems:   AKI (acute kidney injury) (HCC)   Fall at home, initial encounter   Dyslipidemia   Elevated transaminase level   Dementia with behavioral disturbance (HCC)   GERD without esophagitis   Hypothyroidism   Closed fracture of right inferior pubic ramus (HCC)   Mitral regurgitation   Melena  Elevated troponin Mitral regurgitation CAD NSTEMI ruled out.  Troponins peaked at 300.  Subsequently  downtrending.  Suspect supply/demand ischemia.  Seen by cardiology.  Heparin gtt. stopped.  No indication for ischemic evaluation. TTE remarkable only for moderate MR. Hx prior stents - outpt cardiology f/u - holding asa pending anemia eval below  AKI Suspect prerenal azotemia in the setting of poor p.o. intake and poor hydration. Cr remains mildly up from baseline. Appears somewhat fluid overloaded as below - lasix today, f/u cr tomorrow  Acute on chronic iron deficiency anemia No signs of acute blood loss.  Patient's hemoglobin 6.9 here.  Microcytosis.  Iron indices indicative of iron deficiency anemia. Received 1 unit. Patient endorses black stools. Hgb stable post-transfusion in the 8s - continue ppi - for EGD today  Hypoxia Denies dyspnea or cough but has persistent 1-2 liter o2 requirement this hospitalization. Rales at bases, suspect atelectasis, possibly some component of fluid overload - will repeat cxr - lasix x1 as below - start IC  Elevated blood pressure Doesn't carry dx of htn. Bnp elevated and is somewhat edematous. Was on metop previously but this has been held 2/2 bradycardia - will given lasix 40 IV once - consider institution of   Mechanical fall Generalized weakness Right inferior pubic ramus fracture As needed pain control PT and OT consults, recommend SNF Has authorization, can discharge when w/u complete  Lumbar stenosis Radiculopathy History L45 discectomy in 2022. Now with worsening lumbar back pain. MRI is motion degraded showing mod spinal stenosis, s1 nerve compression, no cord compression. Daughter suggested prednisone but patient declines, doesn't want something that will affect sugars and sleep. Wants to try something topical - PT - pain control - lidocaine patch  Hyperlipidemia Statin  Hypothyroidism Synthroid  GERD PPI

## 2023-06-06 NOTE — Discharge Summary (Signed)
OAKLYN DANSBY ZOX:[id number] DOB: 07-Jul-1934 DOA: 05/29/2023  PCP: Norval Morton, MD  Admit date: 05/29/2023 Discharge date: 06/06/2023  Time spent: 35 minutes  Recommendations for Outpatient Follow-up:  Pcp, GI, and orthopedics f/u     Discharge Diagnoses:  Principal Problem:   NSTEMI (non-ST elevated myocardial infarction) [facility]) Active Problems:   AKI (acute kidney injury) (HCC)   Fall at home, initial encounter   Dyslipidemia   Elevated transaminase level   Dementia with behavioral disturbance (HCC)   GERD without esophagitis   Hypothyroidism   Closed fracture of right inferior pubic ramus (HCC)   Mitral regurgitation   Melena   Gastric ulcer   Discharge Condition: stable  Diet recommendation: heart healthy  Filed Weights   05/29/23 0114  Weight: 82.8 kg    History of present illness:  From admission h and p CARMALITA MICHELETTI is a 89 y.o. Caucasian female with medical history significant for osteoarthritis, anxiety, coronary artery disease, GERD, hypertension, dyslipidemia, and CVA, who presented to the emergency room with acute onset of generalized weakness and dizziness and subsequent fall.  Per her daughter who is a FNP she called her son-in-law yesterday stating that she felt terrible and needed help.  Daughter went to her spent the night there.  She stated that she got out of bed and before stepping forward she felt weak and dizzy and fell on her right side hitting her head against the door.  She got confused for about a minute without losing consciousness.  Having increasing dyspnea and dizziness lately walking a few feet.  She admits to occasional chest pain and palpitations.  She admits to occasional cough without wheezing.  She took symptoms after eating solid with drinking liquids.  She denies any leg pain or edema or recent travels or surgeries.  Admits to urinary urgency without dysuria or hematuria or flank pain.  She has been having diminished  appetite with decreased p.o. intake.   Hospital Course:  Patient presents after a mechanical fall. She sustained a pubic ramus fracture. Ortho evaluated, advises non-operative mgmt. Discharged to SNF for rehab, can f/u with Dr. Joice Lofts as outpatient. Patient with elevated troponins, has a hx of CAD with prior stents, she was evaluated by cardiology and this was thought to be demand, no further ischemic w/u pursued. Also found to have anemia with hgb of 6.9, with iron deficiency. This has been developing for some time. GI consulted and EGD revealed gastric ulcer. The EGD was aborted early due to bradycardia. Cardiology re-consulted, reviewed EKG and cardiac monitor, no high-degree heart block or other problem identified, will continue to hold BB at discharge. For the gastric ulcer GI advising BID ppi for 2 months then indefinitely. Also with persistent o2 requirement likely 2/2 atelectasis, cxr day of discharge clear, advise incentive spirometry at discharge. Also complaining of lumbar back pain after fall, has history decompression surgery, mri obtained showing mod spinal stenosis and s1 nerve root compression, no cord compression, can f/u with orthopdics as outpatient.   Procedures: EGD   Consultations: GI, cardiology  Discharge Exam: Vitals:   06/06/23 1145 06/06/23 1211  BP: (!) 172/66 (!) 159/46  Pulse: (!) 56 (!) 55  Resp:  18  Temp: (!) 96.9 F (36.1 C) (!) 97.5 F (36.4 C)  SpO2: 100% 95%    General exam: No acute distress Respiratory system: Lungs clear.  Normal work of breathing.    Cardiovascular system: S1-S2, RRR, no murmurs  Gastrointestinal system: Soft, NT/ND,  Norval Morton, MD Follow up.   Specialty: Family Medicine Contact information: [address] [place] [address] [phone]         [name], MD Follow up.   Specialty: Gastroenterology Contact information: [address] [place] [address] [phone]         Poggi, Excell Seltzer, MD Follow up.   Specialty: Orthopedic Surgery Contact information: [address] [facility] [place] [address] [phone]                  The results of significant diagnostics from this hospitalization (including imaging, microbiology, ancillary and laboratory) are listed below for reference.    Significant Diagnostic Studies: DG Chest Port 1 View  Result Date: 06/06/2023 CLINICAL DATA:  Hypoxia. EXAM: PORTABLE CHEST 1 VIEW COMPARISON:  June 02, 2023. FINDINGS: Stable cardiomegaly. No acute pulmonary disease is noted. Bony thorax is unremarkable. IMPRESSION: No active disease. Electronically Signed   By: Lupita Raider M.D.   On: 06/06/2023 13:11   MR LUMBAR SPINE WO  CONTRAST  Result Date: 06/05/2023 CLINICAL DATA:  Weakness.  Fall. EXAM: MRI LUMBAR SPINE WITHOUT CONTRAST TECHNIQUE: Multiplanar, multisequence MR imaging of the lumbar spine was performed. No intravenous contrast was administered. COMPARISON:  Lumbar spine radiographs 06/03/2023. FINDINGS: Segmentation: Conventional numbering is assumed with 5 non-rib-bearing, lumbar type vertebral bodies. Alignment:  Unchanged grade 1 anterolisthesis of L4 on L5. Vertebrae: Modic type 2 degenerative endplate marrow signal changes at L4-5. Conus medullaris and cauda equina: Conus extends to the L2 level. Conus and cauda equina appear normal. Paraspinal and other soft tissues: Moderate fatty atrophy of the paraspinal muscles. Disc levels: T12-L1: No disc herniation, spinal canal stenosis or neural foraminal narrowing. Mild bilateral facet arthropathy. L1-L2: Small disc bulge without spinal canal stenosis or neural foraminal narrowing. L2-L3:  Disc bulge results in mild spinal canal stenosis. L3-L4: Disc bulge and facet arthropathy results in moderate spinal canal stenosis. L4-L5: Anterolisthesis with uncovered disc and moderate bilateral facet arthropathy with periarticular edema results in mild spinal canal stenosis, severe left and moderate right neural foraminal narrowing. L5-S1: Right eccentric disc bulge and facet arthropathy results in compression of the traversing right S1 nerve root in the subarticular zone. IMPRESSION: 1. Multilevel lumbar spondylosis, worst at L3-L4, where there is moderate spinal canal stenosis. 2. At L4-L5, there is mild spinal canal stenosis, severe left and moderate right neural foraminal narrowing. 3. At L5-S1, there is compression of the traversing right S1 nerve root in the subarticular zone. Electronically Signed   By: Orvan Falconer M.D.   On: 06/05/2023 08:12   DG Orbits  Result Date: 06/04/2023 CLINICAL DATA:  MRI clearance EXAM: ORBITS - COMPLETE 4+ VIEW COMPARISON:  None Available.  FINDINGS: No metallic foreign body of the orbits IMPRESSION: No metallic foreign body of the orbits. Electronically Signed   By: Deatra Robinson M.D.   On: 06/04/2023 23:50   ECHOCARDIOGRAM COMPLETE  Result Date: 06/04/2023    ECHOCARDIOGRAM REPORT   Patient Name:   ZANE MACHIDA Date of Exam: 06/04/2023 Medical Rec #:  [id number]       Height:       64.5 in Accession #:    [id number]      Weight:       182.5 lb Date of Birth:  1933/12/12        BSA:          1.892 m Patient Age:  OAKLYN DANSBY ZOX:[id number] DOB: 07-Jul-1934 DOA: 05/29/2023  PCP: Norval Morton, MD  Admit date: 05/29/2023 Discharge date: 06/06/2023  Time spent: 35 minutes  Recommendations for Outpatient Follow-up:  Pcp, GI, and orthopedics f/u     Discharge Diagnoses:  Principal Problem:   NSTEMI (non-ST elevated myocardial infarction) [facility]) Active Problems:   AKI (acute kidney injury) (HCC)   Fall at home, initial encounter   Dyslipidemia   Elevated transaminase level   Dementia with behavioral disturbance (HCC)   GERD without esophagitis   Hypothyroidism   Closed fracture of right inferior pubic ramus (HCC)   Mitral regurgitation   Melena   Gastric ulcer   Discharge Condition: stable  Diet recommendation: heart healthy  Filed Weights   05/29/23 0114  Weight: 82.8 kg    History of present illness:  From admission h and p CARMALITA MICHELETTI is a 89 y.o. Caucasian female with medical history significant for osteoarthritis, anxiety, coronary artery disease, GERD, hypertension, dyslipidemia, and CVA, who presented to the emergency room with acute onset of generalized weakness and dizziness and subsequent fall.  Per her daughter who is a FNP she called her son-in-law yesterday stating that she felt terrible and needed help.  Daughter went to her spent the night there.  She stated that she got out of bed and before stepping forward she felt weak and dizzy and fell on her right side hitting her head against the door.  She got confused for about a minute without losing consciousness.  Having increasing dyspnea and dizziness lately walking a few feet.  She admits to occasional chest pain and palpitations.  She admits to occasional cough without wheezing.  She took symptoms after eating solid with drinking liquids.  She denies any leg pain or edema or recent travels or surgeries.  Admits to urinary urgency without dysuria or hematuria or flank pain.  She has been having diminished  appetite with decreased p.o. intake.   Hospital Course:  Patient presents after a mechanical fall. She sustained a pubic ramus fracture. Ortho evaluated, advises non-operative mgmt. Discharged to SNF for rehab, can f/u with Dr. Joice Lofts as outpatient. Patient with elevated troponins, has a hx of CAD with prior stents, she was evaluated by cardiology and this was thought to be demand, no further ischemic w/u pursued. Also found to have anemia with hgb of 6.9, with iron deficiency. This has been developing for some time. GI consulted and EGD revealed gastric ulcer. The EGD was aborted early due to bradycardia. Cardiology re-consulted, reviewed EKG and cardiac monitor, no high-degree heart block or other problem identified, will continue to hold BB at discharge. For the gastric ulcer GI advising BID ppi for 2 months then indefinitely. Also with persistent o2 requirement likely 2/2 atelectasis, cxr day of discharge clear, advise incentive spirometry at discharge. Also complaining of lumbar back pain after fall, has history decompression surgery, mri obtained showing mod spinal stenosis and s1 nerve root compression, no cord compression, can f/u with orthopdics as outpatient.   Procedures: EGD   Consultations: GI, cardiology  Discharge Exam: Vitals:   06/06/23 1145 06/06/23 1211  BP: (!) 172/66 (!) 159/46  Pulse: (!) 56 (!) 55  Resp:  18  Temp: (!) 96.9 F (36.1 C) (!) 97.5 F (36.4 C)  SpO2: 100% 95%    General exam: No acute distress Respiratory system: Lungs clear.  Normal work of breathing.    Cardiovascular system: S1-S2, RRR, no murmurs  Gastrointestinal system: Soft, NT/ND,  OAKLYN DANSBY ZOX:[id number] DOB: 07-Jul-1934 DOA: 05/29/2023  PCP: Norval Morton, MD  Admit date: 05/29/2023 Discharge date: 06/06/2023  Time spent: 35 minutes  Recommendations for Outpatient Follow-up:  Pcp, GI, and orthopedics f/u     Discharge Diagnoses:  Principal Problem:   NSTEMI (non-ST elevated myocardial infarction) [facility]) Active Problems:   AKI (acute kidney injury) (HCC)   Fall at home, initial encounter   Dyslipidemia   Elevated transaminase level   Dementia with behavioral disturbance (HCC)   GERD without esophagitis   Hypothyroidism   Closed fracture of right inferior pubic ramus (HCC)   Mitral regurgitation   Melena   Gastric ulcer   Discharge Condition: stable  Diet recommendation: heart healthy  Filed Weights   05/29/23 0114  Weight: 82.8 kg    History of present illness:  From admission h and p CARMALITA MICHELETTI is a 89 y.o. Caucasian female with medical history significant for osteoarthritis, anxiety, coronary artery disease, GERD, hypertension, dyslipidemia, and CVA, who presented to the emergency room with acute onset of generalized weakness and dizziness and subsequent fall.  Per her daughter who is a FNP she called her son-in-law yesterday stating that she felt terrible and needed help.  Daughter went to her spent the night there.  She stated that she got out of bed and before stepping forward she felt weak and dizzy and fell on her right side hitting her head against the door.  She got confused for about a minute without losing consciousness.  Having increasing dyspnea and dizziness lately walking a few feet.  She admits to occasional chest pain and palpitations.  She admits to occasional cough without wheezing.  She took symptoms after eating solid with drinking liquids.  She denies any leg pain or edema or recent travels or surgeries.  Admits to urinary urgency without dysuria or hematuria or flank pain.  She has been having diminished  appetite with decreased p.o. intake.   Hospital Course:  Patient presents after a mechanical fall. She sustained a pubic ramus fracture. Ortho evaluated, advises non-operative mgmt. Discharged to SNF for rehab, can f/u with Dr. Joice Lofts as outpatient. Patient with elevated troponins, has a hx of CAD with prior stents, she was evaluated by cardiology and this was thought to be demand, no further ischemic w/u pursued. Also found to have anemia with hgb of 6.9, with iron deficiency. This has been developing for some time. GI consulted and EGD revealed gastric ulcer. The EGD was aborted early due to bradycardia. Cardiology re-consulted, reviewed EKG and cardiac monitor, no high-degree heart block or other problem identified, will continue to hold BB at discharge. For the gastric ulcer GI advising BID ppi for 2 months then indefinitely. Also with persistent o2 requirement likely 2/2 atelectasis, cxr day of discharge clear, advise incentive spirometry at discharge. Also complaining of lumbar back pain after fall, has history decompression surgery, mri obtained showing mod spinal stenosis and s1 nerve root compression, no cord compression, can f/u with orthopdics as outpatient.   Procedures: EGD   Consultations: GI, cardiology  Discharge Exam: Vitals:   06/06/23 1145 06/06/23 1211  BP: (!) 172/66 (!) 159/46  Pulse: (!) 56 (!) 55  Resp:  18  Temp: (!) 96.9 F (36.1 C) (!) 97.5 F (36.4 C)  SpO2: 100% 95%    General exam: No acute distress Respiratory system: Lungs clear.  Normal work of breathing.    Cardiovascular system: S1-S2, RRR, no murmurs  Gastrointestinal system: Soft, NT/ND,  Norval Morton, MD Follow up.   Specialty: Family Medicine Contact information: [address] [place] [address] [phone]         [name], MD Follow up.   Specialty: Gastroenterology Contact information: [address] [place] [address] [phone]         Poggi, Excell Seltzer, MD Follow up.   Specialty: Orthopedic Surgery Contact information: [address] [facility] [place] [address] [phone]                  The results of significant diagnostics from this hospitalization (including imaging, microbiology, ancillary and laboratory) are listed below for reference.    Significant Diagnostic Studies: DG Chest Port 1 View  Result Date: 06/06/2023 CLINICAL DATA:  Hypoxia. EXAM: PORTABLE CHEST 1 VIEW COMPARISON:  June 02, 2023. FINDINGS: Stable cardiomegaly. No acute pulmonary disease is noted. Bony thorax is unremarkable. IMPRESSION: No active disease. Electronically Signed   By: Lupita Raider M.D.   On: 06/06/2023 13:11   MR LUMBAR SPINE WO  CONTRAST  Result Date: 06/05/2023 CLINICAL DATA:  Weakness.  Fall. EXAM: MRI LUMBAR SPINE WITHOUT CONTRAST TECHNIQUE: Multiplanar, multisequence MR imaging of the lumbar spine was performed. No intravenous contrast was administered. COMPARISON:  Lumbar spine radiographs 06/03/2023. FINDINGS: Segmentation: Conventional numbering is assumed with 5 non-rib-bearing, lumbar type vertebral bodies. Alignment:  Unchanged grade 1 anterolisthesis of L4 on L5. Vertebrae: Modic type 2 degenerative endplate marrow signal changes at L4-5. Conus medullaris and cauda equina: Conus extends to the L2 level. Conus and cauda equina appear normal. Paraspinal and other soft tissues: Moderate fatty atrophy of the paraspinal muscles. Disc levels: T12-L1: No disc herniation, spinal canal stenosis or neural foraminal narrowing. Mild bilateral facet arthropathy. L1-L2: Small disc bulge without spinal canal stenosis or neural foraminal narrowing. L2-L3:  Disc bulge results in mild spinal canal stenosis. L3-L4: Disc bulge and facet arthropathy results in moderate spinal canal stenosis. L4-L5: Anterolisthesis with uncovered disc and moderate bilateral facet arthropathy with periarticular edema results in mild spinal canal stenosis, severe left and moderate right neural foraminal narrowing. L5-S1: Right eccentric disc bulge and facet arthropathy results in compression of the traversing right S1 nerve root in the subarticular zone. IMPRESSION: 1. Multilevel lumbar spondylosis, worst at L3-L4, where there is moderate spinal canal stenosis. 2. At L4-L5, there is mild spinal canal stenosis, severe left and moderate right neural foraminal narrowing. 3. At L5-S1, there is compression of the traversing right S1 nerve root in the subarticular zone. Electronically Signed   By: Orvan Falconer M.D.   On: 06/05/2023 08:12   DG Orbits  Result Date: 06/04/2023 CLINICAL DATA:  MRI clearance EXAM: ORBITS - COMPLETE 4+ VIEW COMPARISON:  None Available.  FINDINGS: No metallic foreign body of the orbits IMPRESSION: No metallic foreign body of the orbits. Electronically Signed   By: Deatra Robinson M.D.   On: 06/04/2023 23:50   ECHOCARDIOGRAM COMPLETE  Result Date: 06/04/2023    ECHOCARDIOGRAM REPORT   Patient Name:   ZANE MACHIDA Date of Exam: 06/04/2023 Medical Rec #:  [id number]       Height:       64.5 in Accession #:    [id number]      Weight:       182.5 lb Date of Birth:  1933/12/12        BSA:          1.892 m Patient Age:  Norval Morton, MD Follow up.   Specialty: Family Medicine Contact information: [address] [place] [address] [phone]         [name], MD Follow up.   Specialty: Gastroenterology Contact information: [address] [place] [address] [phone]         Poggi, Excell Seltzer, MD Follow up.   Specialty: Orthopedic Surgery Contact information: [address] [facility] [place] [address] [phone]                  The results of significant diagnostics from this hospitalization (including imaging, microbiology, ancillary and laboratory) are listed below for reference.    Significant Diagnostic Studies: DG Chest Port 1 View  Result Date: 06/06/2023 CLINICAL DATA:  Hypoxia. EXAM: PORTABLE CHEST 1 VIEW COMPARISON:  June 02, 2023. FINDINGS: Stable cardiomegaly. No acute pulmonary disease is noted. Bony thorax is unremarkable. IMPRESSION: No active disease. Electronically Signed   By: Lupita Raider M.D.   On: 06/06/2023 13:11   MR LUMBAR SPINE WO  CONTRAST  Result Date: 06/05/2023 CLINICAL DATA:  Weakness.  Fall. EXAM: MRI LUMBAR SPINE WITHOUT CONTRAST TECHNIQUE: Multiplanar, multisequence MR imaging of the lumbar spine was performed. No intravenous contrast was administered. COMPARISON:  Lumbar spine radiographs 06/03/2023. FINDINGS: Segmentation: Conventional numbering is assumed with 5 non-rib-bearing, lumbar type vertebral bodies. Alignment:  Unchanged grade 1 anterolisthesis of L4 on L5. Vertebrae: Modic type 2 degenerative endplate marrow signal changes at L4-5. Conus medullaris and cauda equina: Conus extends to the L2 level. Conus and cauda equina appear normal. Paraspinal and other soft tissues: Moderate fatty atrophy of the paraspinal muscles. Disc levels: T12-L1: No disc herniation, spinal canal stenosis or neural foraminal narrowing. Mild bilateral facet arthropathy. L1-L2: Small disc bulge without spinal canal stenosis or neural foraminal narrowing. L2-L3:  Disc bulge results in mild spinal canal stenosis. L3-L4: Disc bulge and facet arthropathy results in moderate spinal canal stenosis. L4-L5: Anterolisthesis with uncovered disc and moderate bilateral facet arthropathy with periarticular edema results in mild spinal canal stenosis, severe left and moderate right neural foraminal narrowing. L5-S1: Right eccentric disc bulge and facet arthropathy results in compression of the traversing right S1 nerve root in the subarticular zone. IMPRESSION: 1. Multilevel lumbar spondylosis, worst at L3-L4, where there is moderate spinal canal stenosis. 2. At L4-L5, there is mild spinal canal stenosis, severe left and moderate right neural foraminal narrowing. 3. At L5-S1, there is compression of the traversing right S1 nerve root in the subarticular zone. Electronically Signed   By: Orvan Falconer M.D.   On: 06/05/2023 08:12   DG Orbits  Result Date: 06/04/2023 CLINICAL DATA:  MRI clearance EXAM: ORBITS - COMPLETE 4+ VIEW COMPARISON:  None Available.  FINDINGS: No metallic foreign body of the orbits IMPRESSION: No metallic foreign body of the orbits. Electronically Signed   By: Deatra Robinson M.D.   On: 06/04/2023 23:50   ECHOCARDIOGRAM COMPLETE  Result Date: 06/04/2023    ECHOCARDIOGRAM REPORT   Patient Name:   ZANE MACHIDA Date of Exam: 06/04/2023 Medical Rec #:  [id number]       Height:       64.5 in Accession #:    [id number]      Weight:       182.5 lb Date of Birth:  1933/12/12        BSA:          1.892 m Patient Age:  OAKLYN DANSBY ZOX:[id number] DOB: 07-Jul-1934 DOA: 05/29/2023  PCP: Norval Morton, MD  Admit date: 05/29/2023 Discharge date: 06/06/2023  Time spent: 35 minutes  Recommendations for Outpatient Follow-up:  Pcp, GI, and orthopedics f/u     Discharge Diagnoses:  Principal Problem:   NSTEMI (non-ST elevated myocardial infarction) [facility]) Active Problems:   AKI (acute kidney injury) (HCC)   Fall at home, initial encounter   Dyslipidemia   Elevated transaminase level   Dementia with behavioral disturbance (HCC)   GERD without esophagitis   Hypothyroidism   Closed fracture of right inferior pubic ramus (HCC)   Mitral regurgitation   Melena   Gastric ulcer   Discharge Condition: stable  Diet recommendation: heart healthy  Filed Weights   05/29/23 0114  Weight: 82.8 kg    History of present illness:  From admission h and p CARMALITA MICHELETTI is a 89 y.o. Caucasian female with medical history significant for osteoarthritis, anxiety, coronary artery disease, GERD, hypertension, dyslipidemia, and CVA, who presented to the emergency room with acute onset of generalized weakness and dizziness and subsequent fall.  Per her daughter who is a FNP she called her son-in-law yesterday stating that she felt terrible and needed help.  Daughter went to her spent the night there.  She stated that she got out of bed and before stepping forward she felt weak and dizzy and fell on her right side hitting her head against the door.  She got confused for about a minute without losing consciousness.  Having increasing dyspnea and dizziness lately walking a few feet.  She admits to occasional chest pain and palpitations.  She admits to occasional cough without wheezing.  She took symptoms after eating solid with drinking liquids.  She denies any leg pain or edema or recent travels or surgeries.  Admits to urinary urgency without dysuria or hematuria or flank pain.  She has been having diminished  appetite with decreased p.o. intake.   Hospital Course:  Patient presents after a mechanical fall. She sustained a pubic ramus fracture. Ortho evaluated, advises non-operative mgmt. Discharged to SNF for rehab, can f/u with Dr. Joice Lofts as outpatient. Patient with elevated troponins, has a hx of CAD with prior stents, she was evaluated by cardiology and this was thought to be demand, no further ischemic w/u pursued. Also found to have anemia with hgb of 6.9, with iron deficiency. This has been developing for some time. GI consulted and EGD revealed gastric ulcer. The EGD was aborted early due to bradycardia. Cardiology re-consulted, reviewed EKG and cardiac monitor, no high-degree heart block or other problem identified, will continue to hold BB at discharge. For the gastric ulcer GI advising BID ppi for 2 months then indefinitely. Also with persistent o2 requirement likely 2/2 atelectasis, cxr day of discharge clear, advise incentive spirometry at discharge. Also complaining of lumbar back pain after fall, has history decompression surgery, mri obtained showing mod spinal stenosis and s1 nerve root compression, no cord compression, can f/u with orthopdics as outpatient.   Procedures: EGD   Consultations: GI, cardiology  Discharge Exam: Vitals:   06/06/23 1145 06/06/23 1211  BP: (!) 172/66 (!) 159/46  Pulse: (!) 56 (!) 55  Resp:  18  Temp: (!) 96.9 F (36.1 C) (!) 97.5 F (36.4 C)  SpO2: 100% 95%    General exam: No acute distress Respiratory system: Lungs clear.  Normal work of breathing.    Cardiovascular system: S1-S2, RRR, no murmurs  Gastrointestinal system: Soft, NT/ND,

## 2023-06-06 NOTE — TOC Progression Note (Addendum)
Transition of Care [facility]) - Progression Note    Patient Details  Name: CALLYN KRICK MRN: [id number] Date of Birth: 10-28-1933  Transition of Care [facility]) CM/SW Contact  Truddie Hidden, RN Phone Number: 06/06/2023, 1:37 PM  Clinical Narrative:    Signed 30 day note submitted to [place] [facility].   1:58pm [place] MUST NW#[id number]  PASR # [id number] E expires 11/24     Expected Discharge Plan: Home w Home Health Services Barriers to Discharge: Continued Medical Work up  Expected Discharge Plan and Services       Living arrangements for the past 2 months: Single Family Home                                       Social Determinants of Health (SDOH) Interventions SDOH Screenings   Food Insecurity: No Food Insecurity (05/29/2023)  Housing: Low Risk  (05/29/2023)  Transportation Needs: No Transportation Needs (05/29/2023)  Utilities: Not At Risk (05/29/2023)  Alcohol Screen: Low Risk  (03/22/2020)  Depression (PHQ2-9): Medium Risk (03/22/2020)  Financial Resource Strain: Low Risk  (02/26/2022)   Received from [facility], [facility]  Physical Activity: Inactive (03/22/2020)  Social Connections: Unknown (03/22/2020)  Stress: No Stress Concern Present (03/22/2020)  Tobacco Use: Medium Risk (05/29/2023)    Readmission Risk Interventions    05/29/2023   11:19 AM  Readmission Risk Prevention Plan  Transportation Screening Complete  PCP or Specialist Appt within 3-5 Days Complete  HRI or Home Care Consult Complete  Social Work Consult for Recovery Care Planning/Counseling Complete  Palliative Care Screening Not Applicable  Medication Review Oceanographer) Not Complete  Med Review Comments Patient will discuss with nurse and MD upon discharge

## 2023-06-06 NOTE — TOC Transition Note (Signed)
Transition of Care [facility]) - CM/SW Discharge Note   Patient Details  Name: Kelly Ritter MRN: [id number] Date of Birth: 1933-08-20  Transition of Care [facility]) CM/SW Contact:  Truddie Hidden, RN Phone Number: 06/06/2023, 2:01 PM   Clinical Narrative:    Spoke with Tammy  in admissions at [facility]  Per facility patient admission confirmed for today. Patient assigned room # 802 Nurse will call report to [phone] Face sheet and medical necessity forms printed to the floor to be added to the EMS pack EMS arranged "She's fourth on the list."  Discharge summary and SNF transfer report sent in HUB.  Nurse, and family notified spoke with Heather TOC signing off.   Final next level of care: Skilled Nursing Facility Barriers to Discharge: Barriers Resolved  Patient Goals and CMS Choice      Discharge Placement                    Name of family member notified: Herbert Seta, daughter Patient and family notified of of transfer: 06/06/23  Discharge Plan and Services Additional resources added to the After Visit Summary for                                       Social Determinants of Health (SDOH) Interventions SDOH Screenings   Food Insecurity: No Food Insecurity (05/29/2023)  Housing: Low Risk  (05/29/2023)  Transportation Needs: No Transportation Needs (05/29/2023)  Utilities: Not At Risk (05/29/2023)  Alcohol Screen: Low Risk  (03/22/2020)  Depression (PHQ2-9): Medium Risk (03/22/2020)  Financial Resource Strain: Low Risk  (02/26/2022)   Received from [facility], [facility]  Physical Activity: Inactive (03/22/2020)  Social Connections: Unknown (03/22/2020)  Stress: No Stress Concern Present (03/22/2020)  Tobacco Use: Medium Risk (05/29/2023)     Readmission Risk Interventions    05/29/2023   11:19 AM  Readmission Risk Prevention Plan  Transportation Screening Complete  PCP or Specialist Appt within 3-5 Days Complete  HRI or Home Care Consult  Complete  Social Work Consult for Recovery Care Planning/Counseling Complete  Palliative Care Screening Not Applicable  Medication Review Oceanographer) Not Complete  Med Review Comments Patient will discuss with nurse and MD upon discharge
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
## 2023-06-06 NOTE — Progress Notes (Signed)
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 Right atrial size was moderately dilated.  5. The mitral valve is normal in structure. Moderate mitral valve regurgitation. No evidence of mitral stenosis.  6. The aortic valve is normal in structure. Aortic valve regurgitation is mild. Mild aortic valve stenosis.  7. The inferior vena cava is normal in size with greater than 50% respiratory variability, suggesting right atrial pressure of 3 mmHg. FINDINGS  Left Ventricle: Left ventricular ejection fraction, by estimation, is 60 to 65%. The left ventricle has normal function. The left ventricle has no regional wall motion abnormalities. The left ventricular internal cavity size was normal in size. There is  no left ventricular hypertrophy. Left ventricular diastolic parameters were normal. Right Ventricle: The right ventricular size is normal. No increase in right ventricular wall thickness. Right ventricular systolic function is normal. Left Atrium: Left atrial size was mildly dilated. Right Atrium: Right atrial size  was moderately dilated. Pericardium: There is no evidence of pericardial effusion. Mitral Valve: The mitral valve is normal in structure. There is moderate thickening of the mitral valve leaflet(s). Moderate mitral valve regurgitation. No evidence of mitral valve stenosis. Tricuspid Valve: The tricuspid valve is normal in structure. Tricuspid valve regurgitation is mild . No evidence of tricuspid stenosis. Aortic Valve: The aortic valve is normal in structure. Aortic valve regurgitation is mild. Mild aortic stenosis is present. Aortic valve mean gradient measures 10.7 mmHg. Aortic valve peak gradient measures 17.9 mmHg. Aortic valve area, by VTI measures 1.83 cm. Pulmonic Valve: The pulmonic valve was normal in structure. Pulmonic valve regurgitation is not visualized. No evidence of pulmonic stenosis. Aorta: The aortic root is normal in size and structure. Venous: The inferior vena cava is normal in size with greater than 50%

## 2023-06-06 NOTE — Progress Notes (Signed)
Per Wadie Lessen PA, patient may go back to her room on 2A  (238).  Taking EKG7s to Dr. Juliann Pares to review.

## 2023-06-06 NOTE — Progress Notes (Signed)
RE: Kelly Ritter   Date of Birth: [birth year] Date: 06/06/2023     To Whom It May Concern:   Please be advised that the above-named patient will require a short-term nursing home stay - anticipated 30 days or less for rehabilitation and strengthening.  The plan is for return home.

## 2023-06-06 NOTE — Anesthesia Postprocedure Evaluation (Signed)
Anesthesia Post Note  Patient: Kelly Ritter  Procedure(s) Performed: ESOPHAGOGASTRODUODENOSCOPY (EGD) WITH PROPOFOL  Patient location during evaluation: PACU Anesthesia Type: General Level of consciousness: awake and awake and alert Pain management: satisfactory to patient Vital Signs Assessment: post-procedure vital signs reviewed and stable Respiratory status: nonlabored ventilation Cardiovascular status: stable Anesthetic complications: no   No notable events documented.   Last Vitals:  Vitals:   06/06/23 1145 06/06/23 1211  BP: (!) 172/66 (!) 159/46  Pulse: (!) 56 (!) 55  Resp:  18  Temp: (!) 36.1 C (!) 36.4 C  SpO2: 100% 95%    Last Pain:  Vitals:   06/06/23 1145  TempSrc:   PainSc: 2                  [name],[name]

## 2023-06-06 NOTE — Op Note (Signed)
[facility] Patient Name: Kelly Ritter Procedure Date: 06/06/2023 10:37 AM MRN: [id number] Account #: [id number] Date of Birth: 12/27/1933 Admit Type: Outpatient Age: 89 Room: [facility] ENDO ROOM 1 Gender: Female Note Status: Finalized Instrument Name: Laurette Schimke [id number] Procedure:             Upper GI endoscopy Indications:           Melena Providers:             [name] MD, MD Referring MD:          No Local Md, MD (Referring MD) Medicines:             Monitored Anesthesia Care Complications:         No immediate complications. Procedure:             Pre-Anesthesia Assessment:                        - Prior to the procedure, a History and Physical was                         performed, and patient medications, allergies and                         sensitivities were reviewed. The patient's tolerance                         of previous anesthesia was reviewed.                        - The risks and benefits of the procedure and the                         sedation options and risks were discussed with the                         patient. All questions were answered and informed                         consent was obtained.                        - ASA Grade Assessment: III - A patient with severe                         systemic disease.                        - Prior to the procedure, a History and Physical was                         performed, and patient medications, allergies and                         sensitivities were reviewed. The patient's tolerance                         of previous anesthesia was reviewed.                        - The risks and  benefits of the procedure and the                         sedation options and risks were discussed with the                         patient. All questions were answered and informed                         consent was obtained.                        After obtaining informed consent,  the endoscope was                         passed under direct vision. Throughout the procedure,                         the patient's blood pressure, pulse, and oxygen                         saturations were monitored continuously. The Endoscope                         was introduced through the mouth, with the intention                         of advancing to the duodenum. The scope was advanced                         to the pylorus before the procedure was aborted.                         Medications were given. The upper GI endoscopy was                         somewhat difficult due to the patient's cardiovascular                         instability (arrhythmia). Successful completion of the                         procedure was aided by receiving assistance from                         additional staff. Findings:      The esophagus was normal.      A small amount of food (residue) was found on the greater curvature of       the stomach.      One non-bleeding superficial gastric ulcer with a clean ulcer base       (Forrest Class III) was found at the pylorus. The lesion was 7 mm in       largest dimension. Impression:            - Normal esophagus.                        - A small amount of food (residue) in the stomach.                        -  Non-bleeding gastric ulcer with a clean ulcer base                         (Forrest Class III).                        - No specimens collected. Recommendation:        - Procedure aborted due to braycardia, possible heart                         block . Bloos pressure over 130 systolic , saturations                         100% heart rate in 50's Procedure Code(s):     --- Professional ---                        43235, 53, Esophagogastroduodenoscopy, flexible,                         transoral; diagnostic, including collection of                         specimen(s) by brushing or washing, when performed                         (separate  procedure) Diagnosis Code(s):     --- Professional ---                        K25.9, Gastric ulcer, unspecified as acute or chronic,                         without hemorrhage or perforation                        K92.1, Melena (includes Hematochezia) CPT copyright 2022 American Medical Association. All rights reserved. The codes documented in this report are preliminary and upon coder review may  be revised to meet current compliance requirements. [name], MD [name] MD, MD 06/06/2023 10:56:29 AM This report has been signed electronically. Number of Addenda: 0 Note Initiated On: 06/06/2023 10:37 AM      [facility]

## 2023-06-06 NOTE — Anesthesia Procedure Notes (Signed)
Procedure Name: MAC Date/Time: 06/06/2023 10:48 AM  Performed by: Cheral Bay, CRNAPre-anesthesia Checklist: Patient identified, Emergency Drugs available, Suction available, Patient being monitored and Timeout performed Patient Re-evaluated:Patient Re-evaluated prior to induction Oxygen Delivery Method: Nasal cannula Induction Type: IV induction Placement Confirmation: positive ETCO2 and CO2 detector
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
## 2023-06-06 NOTE — Transfer of Care (Signed)
Immediate Anesthesia Transfer of Care Note  Patient: Kelly Ritter  Procedure(s) Performed: ESOPHAGOGASTRODUODENOSCOPY (EGD) WITH PROPOFOL  Patient Location: PACU  Anesthesia Type:General  Level of Consciousness: awake  Airway & Oxygen Therapy: Patient Spontanous Breathing and Patient connected to nasal cannula oxygen  Post-op Assessment: Report given to RN and Post -op Vital signs reviewed and stable  Post vital signs: Reviewed and stable  Last Vitals:  Vitals Value Taken Time  BP 167/56 06/06/23 1115  Temp    Pulse 57 06/06/23 1117  Resp 18 06/06/23 1117  SpO2 98 % 06/06/23 1117  Vitals shown include unfiled device data.  Last Pain:  Vitals:   06/06/23 0350  TempSrc: Oral  PainSc:       Patients Stated Pain Goal: 0 (06/03/23 1537)  Complications: VSS. Aake. EKG varing from SB to CHB

## 2023-06-06 NOTE — Care Management Important Message (Signed)
Important Message  Patient Details  Name: Kelly Ritter MRN: [id number] Date of Birth: 1934-01-12   Important Message Given:  Yes - Medicare IM     Truddie Hidden, RN 06/06/2023, 2:11 PM

## 2023-06-06 NOTE — Progress Notes (Signed)
Asked by my associate to write a script for oxycodone for this patient to go to rehab.

## 2023-06-09 ENCOUNTER — Telehealth: Payer: Self-pay

## 2023-06-09 NOTE — Telephone Encounter (Signed)
Lisa from [facility] called to schedule and office visit. I informed her we will need an referral to be sent to our office to schedule the patient. The patient did have an EGD done on 06/06/23 at 10:37 am with Dr. Tobi Bastos.

## 2023-06-09 NOTE — Telephone Encounter (Signed)
Lisa from [facility] called back to schedule office. The patient is schedule to see Dr. Tobi Bastos on 07/24/2023 1:45 pm. The house want the patient to do a follow up.

## 2023-06-10 ENCOUNTER — Encounter: Payer: Self-pay | Admitting: Gastroenterology

## 2023-06-16 ENCOUNTER — Inpatient Hospital Stay: Admission: RE | Admit: 2023-06-16 | Payer: Medicare HMO | Source: Ambulatory Visit

## 2023-06-16 ENCOUNTER — Other Ambulatory Visit: Payer: Medicare HMO

## 2023-07-24 ENCOUNTER — Ambulatory Visit: Payer: Medicare HMO | Admitting: Gastroenterology

## 2023-07-24 NOTE — Progress Notes (Unsigned)
[name] MD, MRCP(U.K) [address]  [address]  [place], [place] [address]  Main: [phone]  Fax: [phone]   Primary Care Physician: Leanord Asal, Nelva Bush, MD  Primary Gastroenterologist:  Dr. [name]   No chief complaint on file.   HPI: Kelly Ritter is a 89 y.o. female   Summary of history :  Kelly Ritter is a 89 y.o. female who has a history of GERD,CAD presented to the ER in 05/2023 with a fall and had a fracture of the pubic ramux . Initially there was a concern for a NSTEMI and then cardiology saw the patient and treated for demand supply ischemia. I was asked to see her for melena. She says a few days back she may have had some black colored stools, but has not paid any attention to the same recently , denies any abdominal pain or NSAID use. Not noticed any other site of bleeding.    1 month back her hemoglobin was 7.6 g and MCV of 80.8.  On admission her hemoglobin was 8.2 g with an MCV that was also low. B12 normal folate normal ferritin 13 urinalysis no blood in the urine.  06/06/2023: EGD: small gastric ulcer seen at antrum non bleeding. Procedure aborted due to bradycardia. Treated with PPI BID. Getting cardiac eval for heart block.   Interval history       ***   Current Outpatient Medications  Medication Sig Dispense Refill   acetaminophen (TYLENOL) 650 MG CR tablet Take 1,300 mg by mouth at bedtime as needed for pain.      aspirin EC 81 MG EC tablet Take 1 tablet (81 mg total) by mouth daily. Swallow whole. 30 tablet 11   atorvastatin (LIPITOR) 80 MG tablet Take 1 tablet (80 mg total) by mouth at bedtime.     Cholecalciferol 25 MCG (1000 UT) tablet Take 1,000 Units by mouth daily.     donepezil (ARICEPT) 10 MG tablet Take 10 mg by mouth daily.     DULoxetine (CYMBALTA) 60 MG capsule Take 60 mg by mouth daily.     gabapentin (NEURONTIN) 100 MG capsule Take 100 mg by mouth every morning.     isosorbide mononitrate (IMDUR) 30 MG 24 hr  tablet Take 1 tablet (30 mg total) by mouth daily. 30 tablet 2   levocetirizine (XYZAL) 5 MG tablet Take 5 mg by mouth every evening.     levothyroxine (SYNTHROID) 25 MCG tablet Take 25 mcg by mouth daily.     omeprazole (PRILOSEC) 40 MG capsule Take by mouth twice daily for 2 months then once daily     oxyCODONE (OXY IR/ROXICODONE) 5 MG immediate release tablet Take 1 tablet (5 mg total) by mouth every 4 (four) hours as needed for severe pain (pain score 7-10). 24 tablet 0   rOPINIRole (REQUIP) 0.5 MG tablet Take 0.5 mg by mouth daily.     traZODone (DESYREL) 50 MG tablet Take 50 mg by mouth at bedtime.     No current facility-administered medications for this visit.    Allergies as of 07/24/2023 - Review Complete 06/06/2023  Allergen Reaction Noted   Sulfa antibiotics Hives, Itching, Swelling, and Rash 03/14/2011   Baclofen Other (See Comments) 03/26/2017   Latex Rash, Hives, and Other (See Comments) 03/14/2011       Interval history   ***/***/202*   ***/***/2024   ROS:  General: Negative for anorexia, weight loss, fever, chills, fatigue, weakness. ENT: Negative for hoarseness, difficulty  swallowing , nasal congestion. CV: Negative for chest pain, angina, palpitations, dyspnea on exertion, peripheral edema.  Respiratory: Negative for dyspnea at rest, dyspnea on exertion, cough, sputum, wheezing.  GI: See history of present illness. GU:  Negative for dysuria, hematuria, urinary incontinence, urinary frequency, nocturnal urination.  Endo: Negative for unusual weight change.    Physical Examination:   There were no vitals taken for this visit.  General: Well-nourished, well-developed in no acute distress.  Eyes: No icterus. Conjunctivae pink. Mouth: Oropharyngeal mucosa moist and pink , no lesions erythema or exudate. Lungs: Clear to auscultation bilaterally. Non-labored. Heart: Regular rate and rhythm, no murmurs rubs or gallops.  Abdomen: Bowel sounds are normal,  nontender, nondistended, no hepatosplenomegaly or masses, no abdominal bruits or hernia , no rebound or guarding.   Extremities: No lower extremity edema. No clubbing or deformities. Neuro: Alert and oriented x 3.  Grossly intact. Skin: Warm and dry, no jaundice.   Psych: Alert and cooperative, normal mood and affect.   Imaging Studies: No results found.  Assessment and Plan:   Kelly Ritter is a 89 y.o. y/o femalehere for hospital follow up when I saw her for melena . EGD showed small non bleeding gastric ulcer. Procedure had to be aborted due to bradyacardia and possible heart block . Discharged home on PPI.    Plan  Check cbc Check for H pylori breath test      Dr [name]  MD,MRCP [facility]) Follow up in ***  BP check ***

## 2023-08-13 DEATH — deceased
